# Patient Record
Sex: Male | Born: 1945 | Race: White | Hispanic: No | Marital: Married | Smoking: Never smoker
Health system: Southern US, Community
[De-identification: ages and names within clinical notes are randomized; demographics above are authoritative.]

## PROBLEM LIST (undated history)

## (undated) DIAGNOSIS — A809 Acute poliomyelitis, unspecified: Secondary | ICD-10-CM

## (undated) DIAGNOSIS — Z9989 Dependence on other enabling machines and devices: Secondary | ICD-10-CM

## (undated) DIAGNOSIS — M1990 Unspecified osteoarthritis, unspecified site: Secondary | ICD-10-CM

## (undated) DIAGNOSIS — I10 Essential (primary) hypertension: Secondary | ICD-10-CM

## (undated) DIAGNOSIS — E785 Hyperlipidemia, unspecified: Secondary | ICD-10-CM

## (undated) DIAGNOSIS — G4733 Obstructive sleep apnea (adult) (pediatric): Secondary | ICD-10-CM

## (undated) DIAGNOSIS — C649 Malignant neoplasm of unspecified kidney, except renal pelvis: Secondary | ICD-10-CM

## (undated) DIAGNOSIS — N189 Chronic kidney disease, unspecified: Secondary | ICD-10-CM

## (undated) DIAGNOSIS — C679 Malignant neoplasm of bladder, unspecified: Secondary | ICD-10-CM

## (undated) DIAGNOSIS — N529 Male erectile dysfunction, unspecified: Secondary | ICD-10-CM

## (undated) DIAGNOSIS — C61 Malignant neoplasm of prostate: Principal | ICD-10-CM

## (undated) DIAGNOSIS — K219 Gastro-esophageal reflux disease without esophagitis: Secondary | ICD-10-CM

---

## 1990-03-06 HISTORY — PX: TRANSURETHRAL RESECTION OF BLADDER TUMOR: SHX2575

## 2001-04-21 ENCOUNTER — Other Ambulatory Visit: Admission: RE | Admit: 2001-04-21 | Discharge: 2001-04-21 | Payer: Self-pay | Admitting: General Surgery

## 2001-04-25 ENCOUNTER — Other Ambulatory Visit: Admission: RE | Admit: 2001-04-25 | Discharge: 2001-04-25 | Payer: Self-pay | Admitting: General Surgery

## 2001-06-08 ENCOUNTER — Ambulatory Visit (HOSPITAL_COMMUNITY): Admission: RE | Admit: 2001-06-08 | Discharge: 2001-06-08 | Payer: Self-pay | Admitting: General Surgery

## 2001-06-08 HISTORY — PX: GANGLION CYST EXCISION: SHX1691

## 2001-10-19 ENCOUNTER — Ambulatory Visit (HOSPITAL_COMMUNITY): Admission: RE | Admit: 2001-10-19 | Discharge: 2001-10-19 | Payer: Self-pay | Admitting: General Surgery

## 2002-07-06 HISTORY — PX: PILONIDAL CYST EXCISION: SHX744

## 2003-02-02 ENCOUNTER — Encounter: Payer: Self-pay | Admitting: Internal Medicine

## 2003-02-02 ENCOUNTER — Encounter (HOSPITAL_COMMUNITY): Admission: RE | Admit: 2003-02-02 | Discharge: 2003-03-04 | Payer: Self-pay | Admitting: Internal Medicine

## 2005-06-15 ENCOUNTER — Other Ambulatory Visit: Admission: RE | Admit: 2005-06-15 | Discharge: 2005-06-15 | Payer: Self-pay | Admitting: Dermatology

## 2007-03-22 ENCOUNTER — Ambulatory Visit (HOSPITAL_COMMUNITY): Admission: RE | Admit: 2007-03-22 | Discharge: 2007-03-22 | Payer: Self-pay | Admitting: Orthopaedic Surgery

## 2010-07-17 ENCOUNTER — Ambulatory Visit (HOSPITAL_COMMUNITY)
Admission: RE | Admit: 2010-07-17 | Discharge: 2010-07-17 | Payer: Self-pay | Source: Home / Self Care | Attending: Family Medicine | Admitting: Family Medicine

## 2010-11-21 NOTE — Op Note (Signed)
[facility]  Patient:    Mason Scott, Mason Scott Visit Number: [id number] MRN: [id number]          Service Type: DSU Location: DAY Attending Physician:  Barbaraann Barthel Dictated by:   Barbaraann Barthel, M.D. Proc. Date: 06/08/01 Admit Date:  06/08/2001                             Operative Report  PREOPERATIVE DIAGNOSIS:  Ganglion cyst, right wrist.  POSTOPERATIVE DIAGNOSIS:  Ganglion cyst, right wrist.  Final pathology pending.  PROCEDURE:  Excision of ganglion cyst.  INDICATIONS:  This is a 65 year old white male who had an increasing mass on the dorsal aspect of his right wrist consistent clinically with a ganglion cyst.  We discussed excision with discussing the complications including but not limited to bleeding, infection and recurrence.  Informed consent was obtained.  GROSS OPERATIVE FINDINGS:  Those consisted of an encapsulated synovial filled ganglion cyst that was approximately 4 cm in diameter.  TECHNIQUE:  The patient was placed in the supine position and after adequate administration of Bier block anesthesia, the right arm was prepped with Betadine solution and draped in the usual manner.  Using a limb isolator, the prepping and draping was complete.  The tourniquet was inflated to 300 mmHg. A transverse incision was carried out over the palpated mass.  This was dissected down to the flexor tendons, avoiding the dorsal vein which was retracted medially and the ganglion was dissected free.  Its insertion sites on the flexor tendon were lightly cauterized with the needle-tip cautery device.  The wound was then irrigated with saline solution.  The subcutaneous layer was closed with 5-0 Polysorb and skin was approximated in horizontal mattress sutures of 5-0 nylon.  Neosporin and a sterile dressing was applied with a cock-up splint placed on the right hand.   Prior to closure, all sponge, needle and instrument counts were found to be  correct.  Estimated blood loss was minimal.  Total tourniquet time was 32 minutes.  There were no complications. Dictated by:   Barbaraann Barthel, M.D. Attending Physician:  Barbaraann Barthel DD:  06/08/01 TD:  06/08/01 Job: [id number]

## 2010-11-21 NOTE — Op Note (Signed)
[facility]  Patient:    Mason Scott, Mason Scott Visit Number: [id number] MRN: [id number]          Service Type: DSU Location: DAY Attending Physician:  Barbaraann Barthel Dictated by:   Barbaraann Barthel, M.D. Proc. 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  OPERATION:  Excision of ganglion cyst, right wrist.  SURGEON:  Barbaraann Barthel, M.D.  NOTE:  This is a 65 year old white male who has a recurrent ganglion of his right wrist.  He was admitted via the outpatient department for excisional biopsy.  TECHNIQUE:  The patient was placed in the supine position.  After adequate administration of a bier block anesthesia, the tourniquet was insufflated to 275 mmHg after evacuating the venous circulation with the Esmarch bandage.  A transverse incision was then carried out after prepping and draping in the usual manner.  The ganglion cyst was then dissected free from the extensor tendons in the mid aspect of the wrist.  There was a small venous tributary that needed to be ligated during this procedure, and the cyst was removed without problem, lightly cauterizing with the needle tip cautery device its insertion site on the tendon.  The wound was then irrigated with normal saline solution.  The subcutaneous layer was closed with 5-0 Polysorb, and the skin was approximated with 5-0 nylon in interrupted horizontal mattress sutures.  A sterile dressing with Neosporin ointment and 4 x 4s and a Kerlix bandage with a cock-up splint was placed on the right hand.  Prior to closure, all sponge, needle, and instrument counts were found to be correct.  Estimated blood loss was minimal.  The patient tolerated the procedure well and was taken to  the recovery room for discharge. Dictated by:   Barbaraann Barthel, M.D. Attending Physician:  Barbaraann Barthel DD:  10/19/01 TD:  10/20/01 Job: [id number]

## 2011-08-03 ENCOUNTER — Ambulatory Visit (HOSPITAL_COMMUNITY)
Admission: RE | Admit: 2011-08-03 | Discharge: 2011-08-03 | Disposition: A | Discharge status: Home / Self Care | Payer: BC Managed Care – PPO | Source: Ambulatory Visit | Attending: Orthopaedic Surgery | Admitting: Orthopaedic Surgery

## 2011-08-03 DIAGNOSIS — M25519 Pain in unspecified shoulder: Secondary | ICD-10-CM | POA: Insufficient documentation

## 2011-08-03 DIAGNOSIS — IMO0001 Reserved for inherently not codable concepts without codable children: Secondary | ICD-10-CM | POA: Insufficient documentation

## 2011-08-03 DIAGNOSIS — M542 Cervicalgia: Secondary | ICD-10-CM | POA: Insufficient documentation

## 2011-08-03 NOTE — Patient Instructions (Addendum)
HEP

## 2011-08-03 NOTE — Evaluation (Signed)
Physical Therapy Evaluation  Patient Details  Name: KHIREE BUKHARI MRN: [id number] Date of Birth: May 03, 1946  Today's Date: 08/03/2011 Time: 5621-3086 Time Calculation (min): 46 min  Visit#: 1  of 8   Re-eval: 09/02/11    Past Medical History: No past medical history on file. Past Surgical History: No past surgical history on file.  Subjective Symptoms/Limitations Symptoms: Mr. Wiltsey states that he has been having neck pain that is running into his right shoulder.  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  The pain has been going on for a few months.  He went to his MD who has referred him to therrapy.    Limitations: House hold activities (driving increases his soreness in his shoulder.) Pain Assessment Currently in Pain?: Yes Pain Score:   3 Pain Location: Scapula Pain Orientation: Right Pain Type: Chronic pain Pain Onset: More than a month ago Pain Frequency: Intermittent    Assessment  Cervical:   ROM and strength wfl  Shoulder:  ROM wfl;  Strength ER/Abduction 5-/5 all others wfl.    Exercise/Treatments   Seated Retraction: Strengthening;10 reps External Rotation: Strengthening;Both;10 reps;Weights External Rotation Weight (lbs): 2 Internal Rotation: Strengthening;Both;Weights Internal Rotation Weight (lbs): 2  Cervical stretch for SB  Sidelying External Rotation: Strengthening;Right;10 reps;Weights External Rotation Weight (lbs): 2 ABduction: Right;10 reps;Weights ABduction Weight (lbs): 2   Manual Therapy Manual Therapy: Massage Massage: R cervical/trap area  Physical Therapy Assessment and Plan PT Assessment and Plan Clinical Impression Statement: Pt with increased scapular pain that travel up into cervcial area who demonstrates slight decreased strength in shld ER and abduction who will benefit from skilled PT to decrease his sx of pain. Rehab Potential: Good Clinical  Impairments Affecting Rehab Potential: decreased strength, stiff cervical area. PT Frequency: Min 2X/week PT Duration: 4 weeks PT Treatment/Interventions: Therapeutic exercise;Other (comment) (modalities as needed) PT Plan: see two times a week for three to four weeks.  Pt to begin backaward UBE, horizontal abductor stretch, T-band for ER, abduction on R.  Begin wall push up and chest stretch on third treatment.    Goals Home Exercise Program Pt will Perform Home Exercise Program: Independently PT Short Term Goals Time to Complete Short Term Goals: 2 weeks PT Short Term Goal 1: Pain to be no greater than a 5 PT Long Term Goals Time to Complete Long Term Goals: 4 weeks PT Long Term Goal 1: I in advance HEP PT Long Term Goal 2: Pt to be able to drive for two hours without having increased pain. Long Term Goal 3: Pain to be no greater than a 2 and to be at a 0 for 80% of the day to improve qualitiy of life. Long Term Goal 4: Pt strength to be wnl  Problem List There is no problem list on file for this patient.   PT - End of Session Activity Tolerance: Patient tolerated treatment well General Behavior During Session: [facility] for tasks performed Cognition: [facility] for tasks performed PT Plan of Care PT Home Exercise Plan: given   Andy Allende,CINDY 08/03/2011, 3:08 PM  Physician Documentation Your signature is required to indicate approval of the treatment plan as stated above.  Please sign and either send electronically or make a copy of this report for your files and return this physician signed original.   Please mark one 1.__approve of plan  2. ___approve of plan with the following conditions.   ______________________________  _____________________ Physician Signature                                                                                                             Date

## 2011-08-05 ENCOUNTER — Ambulatory Visit (HOSPITAL_COMMUNITY): Payer: BC Managed Care – PPO | Admitting: *Deleted

## 2011-08-10 ENCOUNTER — Ambulatory Visit (HOSPITAL_COMMUNITY)
Admission: RE | Admit: 2011-08-10 | Discharge: 2011-08-10 | Disposition: A | Discharge status: Home / Self Care | Payer: BC Managed Care – PPO | Source: Ambulatory Visit | Attending: Family Medicine | Admitting: Family Medicine

## 2011-08-10 DIAGNOSIS — M542 Cervicalgia: Secondary | ICD-10-CM | POA: Insufficient documentation

## 2011-08-10 DIAGNOSIS — M25519 Pain in unspecified shoulder: Secondary | ICD-10-CM | POA: Insufficient documentation

## 2011-08-10 DIAGNOSIS — IMO0001 Reserved for inherently not codable concepts without codable children: Secondary | ICD-10-CM | POA: Insufficient documentation

## 2011-08-10 NOTE — Progress Notes (Signed)
Physical Therapy Treatment Patient Details  Name: Mason Scott MRN: [id number] Date of Birth: [birth year]  Today's Date: 08/10/2011 Time: 7829-5621 Total: 36' Visit#: 2  of 8   Re-eval: 09/02/11 Charges: Therex x 32'  Subjective: Symptoms/Limitations Symptoms: Pt states that he is feeling better than he was last week. He reports his pain has decreased. Pt also reports HEP compliance. Pain Assessment Currently in Pain?: Yes Pain Score:   2 Pain Location: Scapula Pain Orientation: Right Pain Type: Chronic pain  Precautions/Restrictions     Exercise/Treatments Seated Retraction: Strengthening;10 reps External Rotation: Strengthening;Both;10 reps;Weights External Rotation Weight (lbs): 2 Internal Rotation: Strengthening;Both;Weights Internal Rotation Weight (lbs): 2 Sidelying External Rotation: Strengthening;Right;10 reps;Weights External Rotation Weight (lbs): 2 ABduction: Right;10 reps;Weights ABduction Weight (lbs): 2 ROM / Strengthening / Isometric Strengthening UBE (Upper Arm Bike): 4'@2 .0   Stretches Internal Rotation Stretch: 2 reps External Rotation Stretch: 30 seconds;Limitations External Rotation Stretch Limitations: with towel Other Shoulder Stretches: horizontal adduct Other Shoulder Stretches: R upper trap stretch 3x30"   Physical Therapy Assessment and Plan PT Assessment and Plan Clinical Impression Statement: Pt compeltes all therex with good form and minimal need for cueing. Tightness noted in R upper trap and SCM. Began upper trap stretch to decrease tightness. PT reports decreased pain in R cervical area to 1/10. Pt rated dull scapular pain at 3/10 at end of session.  PT Plan: Continue to progress per PT POC.     Problem List There is no problem list on file for this patient.   PT - End of Session Activity Tolerance: Patient tolerated treatment well General Behavior During Session: [facility] for tasks performed Cognition: [facility] for tasks  performed  Antonieta Iba 08/10/2011, 3:45 PM

## 2011-08-12 ENCOUNTER — Ambulatory Visit (HOSPITAL_COMMUNITY): Payer: BC Managed Care – PPO | Admitting: Physical Therapy

## 2011-08-13 ENCOUNTER — Ambulatory Visit (HOSPITAL_COMMUNITY)
Admission: RE | Admit: 2011-08-13 | Discharge: 2011-08-13 | Disposition: A | Discharge status: Home / Self Care | Payer: BC Managed Care – PPO | Source: Ambulatory Visit | Attending: Orthopaedic Surgery | Admitting: Orthopaedic Surgery

## 2011-08-13 NOTE — Progress Notes (Signed)
Physical Therapy Treatment Patient Details  Name: Mason Scott MRN: [id number] Date of Birth: Jul 14, 1945  Today's Date: 08/13/2011 Time: 1350-1440 Time Calculation (min): 50 min Visit#: 3  of 8   Re-eval: 09/02/11 Charges: Therex x 33' Manual x 10'  Subjective: Symptoms/Limitations Symptoms: My pain on average is a 2/10. Pain Assessment Currently in Pain?: Yes Pain Score:   2 Pain Location: Scapula Pain Orientation: Right   Exercise/Treatments Seated Retraction: Strengthening;15 reps External Rotation: Strengthening;Both;10 reps;Weights External Rotation Weight (lbs): 3 Internal Rotation: Strengthening;Both;Weights Internal Rotation Weight (lbs): 3 Sidelying External Rotation: Strengthening;Right;Weights;15 reps External Rotation Weight (lbs): 3 ABduction: Right;Weights;15 reps ABduction Weight (lbs): 3 Standing External Rotation: 15 reps;Right;Theraband Theraband Level (Shoulder External Rotation): Level 3 (Green) ABduction: 10 reps;Left;Theraband Theraband Level (Shoulder ABduction): Level 3 (Green) Other Standing Exercises: Wall push-up x 10 ROM / Strengthening / Isometric Strengthening UBE (Upper Arm Bike): 4'@2 .0 Stretches Cross Chest Stretch: 3 reps;30 seconds;Limitations (horizontal adduction) Internal Rotation Stretch: 3 reps External Rotation Stretch: 30 seconds External Rotation Stretch Limitations: with towel Other Shoulder Stretches: Standing chest stretch 3x30" Other Shoulder Stretches: R upper trap stretch 3x30"  Manual Therapy Manual Therapy: Massage Massage: R upper trap and teres minor x 10'  Physical Therapy Assessment and Plan PT Assessment and Plan Clinical Impression Statement: Pt completes all exercises without difficulty. Began wall push-up and chest stretch to improve flexibility and strength. Pt reports decreased tightness after manual. Pt reports no change in pain at end of session. PT Plan: Continue to progress per PT POC.      Problem List There is no problem list on file for this patient.   PT - End of Session Activity Tolerance: Patient tolerated treatment well General Behavior During Session: [facility] for tasks performed Cognition: [facility] for tasks performed  Antonieta Iba 08/13/2011, 2:50 PM

## 2011-08-17 ENCOUNTER — Ambulatory Visit (HOSPITAL_COMMUNITY)
Admission: RE | Admit: 2011-08-17 | Discharge: 2011-08-17 | Disposition: A | Discharge status: Home / Self Care | Payer: BC Managed Care – PPO | Source: Ambulatory Visit | Attending: Orthopaedic Surgery | Admitting: Orthopaedic Surgery

## 2011-08-17 NOTE — Progress Notes (Signed)
Physical Therapy Treatment Patient Details  Name: TIMOHTY RENBARGER MRN: [id number] Date of Birth: 14-Jan-1946  Today's Date: 08/17/2011 Time: 1308-6578 Time Calculation (min): 42 min Visit#: 4  of 8   Re-eval: 09/02/11 Charges: Therex x 28' Manual x 12'  Subjective: Symptoms/Limitations Symptoms: Pt rates pain a 3/10 today. Pain Assessment Currently in Pain?: Yes Pain Score:   3 Pain Location: Shoulder Pain Orientation: Right   Exercise/Treatments Seated Other Seated Exercises: cervical retraction 10x5" Other Seated Exercises: w-back 10x5" Sidelying External Rotation: Strengthening;Right;Weights;15 reps External Rotation Weight (lbs): 4 ABduction: Right;Weights;15 reps ABduction Weight (lbs): 4 Standing External Rotation: 20 reps Theraband Level (Shoulder External Rotation): Level 3 (Green) ABduction: 15 reps Theraband Level (Shoulder ABduction): Level 3 (Green) Other Standing Exercises: Wall push-up x 15 Stretches Cross Chest Stretch: 3 reps;30 seconds;Limitations Cross Chest Stretch Limitations: horizontal abd Other Shoulder Stretches: R upper trap stretch 3x30"    Manual Therapy Manual Therapy: Massage Massage: R upper trap and teres minor x 12'  Physical Therapy Assessment and Plan PT Assessment and Plan Clinical Impression Statement: Pt displays good form with all exercises. Began seated cervical retraction to improve posture. Pt had moderate difficulty with this exercises secondary to weakness. Pt reports no change in pain at end of session but decreased tightness. PT Plan: Continue to progress per PT POC.     Problem List There is no problem list on file for this patient.   PT - End of Session Activity Tolerance: Patient tolerated treatment well General Behavior During Session: [facility] for tasks performed Cognition: [facility] for tasks performed  Antonieta Iba 08/17/2011, 4:54 PM

## 2011-08-19 ENCOUNTER — Ambulatory Visit (HOSPITAL_COMMUNITY): Payer: BC Managed Care – PPO | Admitting: *Deleted

## 2011-08-20 ENCOUNTER — Ambulatory Visit (HOSPITAL_COMMUNITY)
Admission: RE | Admit: 2011-08-20 | Discharge: 2011-08-20 | Disposition: A | Discharge status: Home / Self Care | Payer: BC Managed Care – PPO | Source: Ambulatory Visit | Attending: Orthopaedic Surgery | Admitting: Orthopaedic Surgery

## 2011-08-20 NOTE — Progress Notes (Signed)
Physical Therapy Treatment Patient Details  Name: Mason Scott MRN: [id number] Date of Birth: 15-Aug-1945  Today's Date: 08/20/2011 Time: 0102-7253 Time Calculation (min): 45 min Visit#: 5  of 8   Re-eval: 09/02/11 Diagnosis: R shld impingement/cervical OA Next MD Visit: 09/02/2011 Charges:  Therex 30', massage 12'  Subjective: Symptoms/Limitations Symptoms: Pt. states his pain remains 3/10 as last visit. Pain Assessment Currently in Pain?: Yes Pain Score:   3 Pain Location: Shoulder Pain Orientation: Right   Exercise/Treatments Prone  Retraction: 10 reps Extension: 10 reps Horizontal ABduction 1: 10 reps Other Prone Exercises: chin tuck head lift 10 reps Sidelying External Rotation: 15 reps External Rotation Weight (lbs): 4 ABduction: 15 reps ABduction Weight (lbs): 4 Standing External Rotation: 20 reps Theraband Level (Shoulder External Rotation): Level 4 (Blue) ABduction: 20 reps Theraband Level (Shoulder ABduction): Level 4 (Blue) Other Standing Exercises: Wall push-up x 15 ROM / Strengthening / Isometric Strengthening UBE (Upper Arm Bike): 4'@2 .0     Manual Therapy Manual Therapy: Massage Massage: R upper trap/scap and rhomboids X 12'  Physical Therapy Assessment and Plan PT Assessment and Plan Clinical Impression Statement: Progressed to prone exercises today without difficulty, no spasms/tightness palpated in R cervical region, however multiple spasms in scapular region resolved with massage.  Pt. reported significant reduction in pain following treatment. PT Plan: Continue to progress strength and decrease pain.     PT - End of Session Activity Tolerance: Patient tolerated treatment well General Behavior During Session: [facility] for tasks performed Cognition: [facility] for tasks performed  [name] B. Bascom Levels, PTA 08/20/2011, 2:34 PM

## 2011-08-24 ENCOUNTER — Ambulatory Visit (HOSPITAL_COMMUNITY)
Admission: RE | Admit: 2011-08-24 | Discharge: 2011-08-24 | Disposition: A | Discharge status: Home / Self Care | Payer: BC Managed Care – PPO | Source: Ambulatory Visit | Attending: Orthopaedic Surgery | Admitting: Orthopaedic Surgery

## 2011-08-24 NOTE — Progress Notes (Signed)
Physical Therapy Treatment Patient Details  Name: Mason Scott MRN: [id number] Date of Birth: [birth year]  Today's Date: 08/24/2011 Time: 2440-1027 Time Calculation (min): 45 min Visit#: 6  of 8   Re-eval: 09/02/11 Charges: Therex x 22' Manual x 8' Ultrasound x 8'  Subjective: Symptoms/Limitations Symptoms: Pt states that his pain is about a 2/10 in his R shoulder. Pain Assessment Currently in Pain?: Yes Pain Score:   3 Pain Location: Shoulder Pain Orientation: Right   Exercise/Treatments Prone  Retraction: 10 reps;Weights Retraction Weight (lbs): 2 Extension: 10 reps;Weights Extension Weight (lbs): 2 Horizontal ABduction 1: 10 reps;Weights Horizontal ABduction 1 Weight (lbs): 2 Other Prone Exercises: chin tuck head lift x 15 Sidelying External Rotation: 20 reps External Rotation Weight (lbs): 4 ABduction: 20 reps ABduction Weight (lbs): 4 Standing External Rotation: 20 reps Theraband Level (Shoulder External Rotation): Level 4 (Blue) ABduction: 20 reps Theraband Level (Shoulder ABduction): Level 4 (Blue) Other Standing Exercises: Wall push-up x 20 ROM / Strengthening / Isometric Strengthening UBE (Upper Arm Bike): 4'@2 .0  Modalities Modalities: Ultrasound Manual Therapy Manual Therapy: Massage Massage: R upper trap/scap X 8' Ultrasound Ultrasound Location: R upper trap Ultrasound Parameters: 1.4 w/cm2 continuous x 8'  Physical Therapy Assessment and Plan PT Assessment and Plan Clinical Impression Statement: Pt completes exercises without difficulty and minimal need for cueing. Began ultrasound this session to decrease pain and tightness. Pt reports that his shoulder and scapular region better at end of session but the mm felt a little tight from the exercises. PT Plan: Continue to progress per PT POC. Assess effects of ultrasound next session.     Problem List There is no problem list on file for this patient.   PT - End of Session Activity  Tolerance: Patient tolerated treatment well General Behavior During Session: [facility] for tasks performed Cognition: [facility] for tasks performed  Antonieta Iba 08/24/2011, 3:37 PM

## 2011-08-26 ENCOUNTER — Ambulatory Visit (HOSPITAL_COMMUNITY): Payer: BC Managed Care – PPO | Admitting: Physical Therapy

## 2011-08-27 ENCOUNTER — Ambulatory Visit (HOSPITAL_COMMUNITY): Payer: BC Managed Care – PPO | Admitting: *Deleted

## 2012-05-23 DIAGNOSIS — C61 Malignant neoplasm of prostate: Secondary | ICD-10-CM

## 2012-05-23 HISTORY — DX: Malignant neoplasm of prostate: C61

## 2012-05-23 HISTORY — PX: PROSTATE BIOPSY: SHX241

## 2012-07-21 ENCOUNTER — Ambulatory Visit
Admission: RE | Admit: 2012-07-21 | Discharge: 2012-07-21 | Disposition: A | Discharge status: Home / Self Care | Payer: MEDICARE | Source: Ambulatory Visit | Attending: Radiation Oncology | Admitting: Radiation Oncology

## 2012-07-21 ENCOUNTER — Encounter: Payer: Self-pay | Admitting: Radiation Oncology

## 2012-07-21 VITALS — BP 146/76 | HR 61 | Temp 98.2°F | Ht 70.0 in | Wt 216.3 lb

## 2012-07-21 DIAGNOSIS — C61 Malignant neoplasm of prostate: Secondary | ICD-10-CM | POA: Insufficient documentation

## 2012-07-21 DIAGNOSIS — N529 Male erectile dysfunction, unspecified: Secondary | ICD-10-CM | POA: Insufficient documentation

## 2012-07-21 DIAGNOSIS — Z7982 Long term (current) use of aspirin: Secondary | ICD-10-CM | POA: Insufficient documentation

## 2012-07-21 DIAGNOSIS — G4733 Obstructive sleep apnea (adult) (pediatric): Secondary | ICD-10-CM | POA: Insufficient documentation

## 2012-07-21 DIAGNOSIS — E785 Hyperlipidemia, unspecified: Secondary | ICD-10-CM | POA: Insufficient documentation

## 2012-07-21 DIAGNOSIS — Z8551 Personal history of malignant neoplasm of bladder: Secondary | ICD-10-CM | POA: Insufficient documentation

## 2012-07-21 DIAGNOSIS — I10 Essential (primary) hypertension: Secondary | ICD-10-CM | POA: Insufficient documentation

## 2012-07-21 DIAGNOSIS — Z79899 Other long term (current) drug therapy: Secondary | ICD-10-CM | POA: Insufficient documentation

## 2012-07-21 HISTORY — DX: Essential (primary) hypertension: I10

## 2012-07-21 HISTORY — DX: Unspecified osteoarthritis, unspecified site: M19.90

## 2012-07-21 HISTORY — DX: Hyperlipidemia, unspecified: E78.5

## 2012-07-21 HISTORY — DX: Male erectile dysfunction, unspecified: N52.9

## 2012-07-21 HISTORY — DX: Malignant neoplasm of prostate: C61

## 2012-07-21 NOTE — Progress Notes (Signed)
[facility] Radiation Oncology NEW PATIENT EVALUATION  Name: Mason Scott MRN: [id number]  Date:   07/21/2012           DOB: 08-04-1945  Status: outpatient   CC: Colette Ribas, MD  Crecencio Mc, MD    REFERRING PHYSICIAN: Crecencio Mc, MD   DIAGNOSIS: Stage TI C. favorable risk adenocarcinoma prostate   HISTORY OF PRESENT ILLNESS:  Mason Scott is a 67 y.o. male who is seen today for the courtesy of Dr. Crecencio Mc for discussion of possible seed implantation in the management of his stage TI C. favorable risk adenocarcinoma prostate. He was seen by Dr. Laverle Patter for a second opinion after being diagnosed by Dr. Andi Hence M.D. and Diamantina Providence after his then have a PSA of 6.14 on 04/04/2012. 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 Prostate volume was reported be 29.8 cc. No GU or GI difficulties. His I PSS score is 2. 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 No recurrences since then with normal cystoscopy in September 2013. He does have mild erectile dysfunction which does not always respond to Cialis or Viagra. He was referred to Dr. Laverle Patter for discussion of robotic surgery. He seen today for discussion of possible radiation therapy/seed implantation.  PREVIOUS RADIATION THERAPY: No   PAST MEDICAL HISTORY:  has a past medical history of Obstructive sleep apnea, adult; Arthritis; Hyperlipidemia; Hypertension; Prostate cancer (05/23/12); History of bladder cancer (03/1990); and Erectile dysfunction.     PAST SURGICAL HISTORY:  Past Surgical History    Procedure Date  . Prostate biopsy 05/23/12  . Transurethral resection of bladder tumor 03/1990  . Cyst excision     lower spine years ago     FAMILY HISTORY: family history includes Congenital heart disease in his mother and Stroke in his father. His father died at 64 following a stroke. His mother died following a motor vehicle accident at 10. No family history of prostate cancer.   SOCIAL HISTORY:  reports that he has never smoked. He has never used smokeless tobacco. He reports that he does not drink alcohol or use illicit drugs. Married, 2 adopted children. His daughter is getting married in September in [place]. Retired from [employer].   ALLERGIES: Review of patient's allergies indicates no known allergies.   MEDICATIONS:  Current Outpatient Prescriptions  Medication Sig Dispense Refill  . amLODipine (NORVASC) 10 MG tablet Take 10 mg by mouth daily.      Marland Kitchen aspirin 81 MG tablet Take 81 mg by mouth daily.      . fish oil-omega-3 fatty acids 1000 MG capsule Take 1 g by mouth daily.      . saw palmetto 500 MG capsule Take 500 mg by mouth daily. 1000 mg      . valsartan-hydrochlorothiazide (DIOVAN-HCT) 160-25 MG per tablet Take 1 tablet by mouth daily. Takes a half pill qam.         REVIEW OF SYSTEMS:  Pertinent items are noted in HPI.    PHYSICAL EXAM:  height is 5\' 10"  (  1.778 m) and weight is 216 lb 4.8 oz (98.113 kg). His temperature is 98.2 F (36.8 C). His blood pressure is 146/76 and his pulse is 61.   Alert and oriented 67 year old white male appearing his stated age. Head neck examination: Grossly unremarkable. Nodes: Without palpable cervical or supraclavicular lymphadenopathy. Chest: Lungs clear. Heart: Regular in rhythm. Back: Without spinal or CVA tenderness. Abdomen: Without masses organomegaly. Genitalia: Normal to inspection. Rectal: The prostate gland is normal in size and is without focal induration or nodularity. Extremities: Without edema.   LABORATORY  DATA:  No results found for this basename: WBC, HGB, HCT, MCV, PLT   No results found for this basename: NA, K, CL, CO2   No results found for this basename: ALT, AST, GGT, ALKPHOS, BILITOT   PSA 6.14 from 04/04/2012   IMPRESSION: Stage TI C. favorable risk adenocarcinoma prostate. I explained to the patient and his wife that his prognosis is related to his stage, Gleason score, and PSA level. All are favorable. His treatment options include surgery versus close surveillance/observation versus radiation therapy. Considering his relatively young age and excellent performance status, I would certainly offer him curative treatment. Radiation therapy options include seed implantation alone or 8 weeks of external beam/IMRT. After lengthy discussion he would be most interested in seed implantation as a radiation therapy option. We discussed the potential acute and late toxicities and also radiation therapy safety issues with seed implantation. He home and think things over and call Dr. Laverle Patter he wants to proceed with surgery or call me if he wants to schedule seed implantation. He comes make a decision by early next week.   PLAN: As discussed above. He knows we will need to do a CT arch study if he wants to proceed with seed implantation.   I spent 60 minutes minutes face to face with the patient and more than 50% of that time was spent in counseling and/or coordination of care.

## 2012-07-21 NOTE — Progress Notes (Signed)
Please see the Nurse Progress Note in the MD Initial Consult Encounter for this patient. 

## 2012-07-21 NOTE — Addendum Note (Signed)
Encounter addended by: Tessa Lerner, RN on: 07/21/2012  3:54 PM<BR>     Documentation filed: Charges VN

## 2012-07-21 NOTE — Addendum Note (Signed)
Encounter addended by: Tessa Lerner, RN on: 07/21/2012  4:10 PM<BR>     Documentation filed: Charges VN

## 2012-07-21 NOTE — Progress Notes (Signed)
Patient for radiation consultation of prostate cancer. PSA  6.14 Gleason score 3+3=6 Prostate volume 29.8 cc's No family history  of prostate cancer

## 2012-07-25 ENCOUNTER — Telehealth: Payer: Self-pay | Admitting: *Deleted

## 2012-07-25 ENCOUNTER — Encounter: Payer: Self-pay | Admitting: Radiation Oncology

## 2012-07-25 NOTE — Progress Notes (Signed)
Chart note: Mason Scott called this morning to tell me that he wants to proceed with a seed implant with Dr. Vevelyn Royals assistance. Will go ahead and get him scheduled for a CT arch study in the near future.

## 2012-07-25 NOTE — Telephone Encounter (Signed)
CALLED PATIENT TO INFORM OF PRE-SEED APPT. FOR 08-15-12 AT 9:00 AM , SPOKE WITH PATIENT AND HE IS AWARE OF THIS APPT.

## 2012-08-08 ENCOUNTER — Other Ambulatory Visit: Payer: Self-pay | Admitting: Radiation Oncology

## 2012-08-15 ENCOUNTER — Ambulatory Visit
Admission: RE | Admit: 2012-08-15 | Discharge: 2012-08-15 | Disposition: A | Discharge status: Home / Self Care | Payer: MEDICARE | Source: Ambulatory Visit | Attending: Radiation Oncology | Admitting: Radiation Oncology

## 2012-08-15 ENCOUNTER — Telehealth: Payer: Self-pay | Admitting: *Deleted

## 2012-08-15 ENCOUNTER — Other Ambulatory Visit: Payer: Self-pay | Admitting: Urology

## 2012-08-15 DIAGNOSIS — C61 Malignant neoplasm of prostate: Secondary | ICD-10-CM | POA: Insufficient documentation

## 2012-08-15 NOTE — Progress Notes (Signed)
   Complex CT simulation/treatment planning note: The patient was taken to the CT simulator. He was placed supine. His pelvis was scanned. I contoured the prostate and projected the prostate over the bony pubic arch. The arch is open. Prostate volume is 27.64 cc with a prosthetic length of 3.3 cm. I prescribing 14,500 cGy utilizing I-125 seeds. He is to be implanted with [employer] system.

## 2012-08-15 NOTE — Telephone Encounter (Signed)
CALLED PATIENT TO INFORM OF IMPLANT DATE, LVM FOR A RETURN CALL 

## 2012-08-16 ENCOUNTER — Ambulatory Visit (HOSPITAL_BASED_OUTPATIENT_CLINIC_OR_DEPARTMENT_OTHER)
Admission: RE | Admit: 2012-08-16 | Discharge: 2012-08-16 | Disposition: A | Discharge status: Home / Self Care | Payer: MEDICARE | Source: Ambulatory Visit | Attending: Urology | Admitting: Urology

## 2012-08-16 ENCOUNTER — Other Ambulatory Visit: Payer: Self-pay | Admitting: Urology

## 2012-08-16 ENCOUNTER — Encounter (HOSPITAL_BASED_OUTPATIENT_CLINIC_OR_DEPARTMENT_OTHER)
Admission: RE | Admit: 2012-08-16 | Discharge: 2012-08-16 | Disposition: A | Discharge status: Home / Self Care | Payer: MEDICARE | Source: Ambulatory Visit | Attending: Urology | Admitting: Urology

## 2012-08-16 DIAGNOSIS — C61 Malignant neoplasm of prostate: Secondary | ICD-10-CM | POA: Insufficient documentation

## 2012-08-16 DIAGNOSIS — Z01810 Encounter for preprocedural cardiovascular examination: Secondary | ICD-10-CM | POA: Insufficient documentation

## 2012-08-16 DIAGNOSIS — Z01812 Encounter for preprocedural laboratory examination: Secondary | ICD-10-CM | POA: Insufficient documentation

## 2012-09-20 ENCOUNTER — Encounter (HOSPITAL_BASED_OUTPATIENT_CLINIC_OR_DEPARTMENT_OTHER): Payer: Self-pay | Admitting: *Deleted

## 2012-09-21 ENCOUNTER — Telehealth: Payer: Self-pay | Admitting: *Deleted

## 2012-09-21 NOTE — Telephone Encounter (Signed)
Called patient to remind of appt. For 09-22-12, spoke with patient and she is aware of this appt.

## 2012-09-22 ENCOUNTER — Encounter (HOSPITAL_BASED_OUTPATIENT_CLINIC_OR_DEPARTMENT_OTHER): Payer: Self-pay | Admitting: *Deleted

## 2012-09-22 LAB — COMPREHENSIVE METABOLIC PANEL
ALT: 16 U/L (ref 0–53)
AST: 16 U/L (ref 0–37)
Calcium: 9.1 mg/dL (ref 8.4–10.5)
Creatinine, Ser: 1.05 mg/dL (ref 0.50–1.35)
GFR calc Af Amer: 84 mL/min — ABNORMAL LOW (ref >90)
Sodium: 136 mEq/L (ref 135–145)
Total Protein: 6.7 g/dL (ref 6.0–8.3)

## 2012-09-22 LAB — CBC
MCH: 31.3 pg (ref 26.0–34.0)
MCHC: 34.8 g/dL (ref 30.0–36.0)
Platelets: 212 10*3/uL (ref 150–400)

## 2012-09-22 LAB — PROTIME-INR: INR: 1.06 (ref 0.00–1.49)

## 2012-09-22 LAB — APTT: aPTT: 33 seconds (ref 24–37)

## 2012-09-22 NOTE — Progress Notes (Signed)
NPO AFTER MN WITH EXCEPTION CLEAR LIQUIDS UNTIL 0830 (NO CREAM/ MILK PRODUCTS). ARRIVES AT 1315.  CURRENT LAB WORK , EKG , AND CXR IN EPIC AND CHART. WILL DO FLEET ENEMA AM OF SURG.

## 2012-09-28 ENCOUNTER — Telehealth: Payer: Self-pay | Admitting: *Deleted

## 2012-09-28 NOTE — H&P (Signed)
Chief Complaint  Prostate Cancer   History of Present Illness     Mason Scott is a 67 year old gentleman seen for a 2nd opinion regarding his options for treatment/management of his prostate cancer. 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  He has had no recurrences since then with his most recent cystoscopy in September 2013 negative for recurrence.   His PSA was found to be increased in September 2013 to 6.14 and his exam reportedly demonstrated left sided induration of the prostate which prompted a prostate biopsy on 05/31/12 and confirmed Gleason 3+3=6 adenocarcinoma of the prostate in 5 out of 12 biopsy cores. He is overall healthy with hypertension but no other comorbid conditions. He has no family history of prostate cancer.  TNM stage: cT2a Nx Mx PSA: 6.14 Gleason score: 3+3=6 Biopsy (05/31/12 - [facility], read by Dr. Marva Panda, Acc # [phone]): 5/12 cores positive   Left: L apex (25% of tissue, 2/2 cores, 3+3=6), L mid (40% of tissue, 2/2 cores, 3+3=6), L base (20% of tissue, 1/2 cores, 3+3=6)   Right: HGPIN in apex, mid, and base Prostate volume: 29.8 cc  Urinary function: He has minimal voiding symptoms. IPSS is 1. Erectile function: He does have mild erectile dysfunction will occasionally utilize Cialis or Viagra although more commonly will not use any medication. He does have some difficulty maintaining an erection. SHIM score is 21.   Past Medical History Problems  1. History of  Adult Sleep Apnea 780.57 2. History of  Arthritis V13.4 3. History of  Hyperlipidemia 272.4 4. History of  Hypertension 401.9   He does use a CPAP machine nightly.   Surgical History Problems  1. History of  Cystoscopy Bladder Tumor 596.9  Current Meds 1. AmLODIPine Besylate 10 MG Oral Tablet; Therapy: (Recorded:03Jan2014) to 2. Aspir-81 81 MG Oral Tablet  Delayed Release; Therapy: (Recorded:03Jan2014) to 3. CPAP; Therapy: (Recorded:03Jan2014) to 4. Diovan HCT 160-25 MG Oral Tablet; Therapy: (Recorded:03Jan2014) to 5. Fish Oil Concentrate 1000 MG Oral Capsule; Therapy: (Recorded:03Jan2014) to 6. Flomax 0.4 MG Oral Capsule; Therapy: (Recorded:03Jan2014) to 7. Saw Palmetto 1000 MG Oral Capsule; Therapy: (Recorded:03Jan2014) to  Allergies Medication  1. No Known Drug Allergies  Family History Problems  1. Maternal history of  Breast Cancer V16.3 2. Paternal history of  Stroke Syndrome V17.1 Denied  3. Family history of  Prostate Cancer  Social History Problems    Marital History - Currently Married   Never A Smoker Denied    History of  Alcohol Use  Review of Systems Constitutional, skin, eye, otolaryngeal, hematologic/lymphatic, cardiovascular, pulmonary, endocrine, musculoskeletal, gastrointestinal, neurological and psychiatric system(s) were reviewed and pertinent findings if present are noted.  Musculoskeletal: joint pain.    Vitals  BMI Calculated: 30.07 BSA Calculated: 2.13 Height: 5 ft 10 in Weight: 210 lb    Physical Exam Constitutional: Well nourished and well developed . No acute distress.  ENT:. The ears and nose are normal in appearance.  Neck: The appearance of the neck is normal and no neck mass is present.  Pulmonary: No respiratory distress, normal respiratory rhythm and effort and clear bilateral breath sounds.  Cardiovascular: Heart rate and rhythm are normal . No peripheral edema.  Abdomen: The abdomen is soft and nontender. No masses are palpated. No CVA tenderness. No hernias are palpable. No hepatosplenomegaly noted.   Lymphatics: The femoral and inguinal nodes are not enlarged or tender.  Skin: Normal skin turgor, no visible rash and no visible skin lesions.  Neuro/Psych:. Mood and affect are appropriate.    Assessment Assessed  1. Prostate Cancer 185     Discussion/Summary  1. Prostate  cancer: He has elected to be treated with brachytherapy and will undergo a radiation I-125 seed implantation today and cystoscopy.

## 2012-09-28 NOTE — Telephone Encounter (Signed)
CALLED PATIENT TO REMIND OF PROCEDURE FOR 09-29-12, SPOKE WITH PATIENT AND HE IS AWARE OF THIS PROCEDURE

## 2012-09-29 ENCOUNTER — Ambulatory Visit (HOSPITAL_BASED_OUTPATIENT_CLINIC_OR_DEPARTMENT_OTHER): Payer: MEDICARE | Admitting: Anesthesiology

## 2012-09-29 ENCOUNTER — Encounter (HOSPITAL_BASED_OUTPATIENT_CLINIC_OR_DEPARTMENT_OTHER): Payer: Self-pay | Admitting: Anesthesiology

## 2012-09-29 ENCOUNTER — Ambulatory Visit (HOSPITAL_COMMUNITY): Payer: MEDICARE

## 2012-09-29 ENCOUNTER — Ambulatory Visit (HOSPITAL_BASED_OUTPATIENT_CLINIC_OR_DEPARTMENT_OTHER)
Admission: RE | Admit: 2012-09-29 | Discharge: 2012-09-29 | Disposition: A | Discharge status: Home / Self Care | Payer: MEDICARE | Source: Ambulatory Visit | Attending: Urology | Admitting: Urology

## 2012-09-29 ENCOUNTER — Encounter: Payer: Self-pay | Admitting: Radiation Oncology

## 2012-09-29 ENCOUNTER — Encounter (HOSPITAL_BASED_OUTPATIENT_CLINIC_OR_DEPARTMENT_OTHER)
Admission: RE | Disposition: A | Discharge status: Home / Self Care | Payer: Self-pay | Source: Ambulatory Visit | Attending: Urology

## 2012-09-29 DIAGNOSIS — C61 Malignant neoplasm of prostate: Secondary | ICD-10-CM | POA: Insufficient documentation

## 2012-09-29 DIAGNOSIS — G4730 Sleep apnea, unspecified: Secondary | ICD-10-CM | POA: Insufficient documentation

## 2012-09-29 DIAGNOSIS — Z8551 Personal history of malignant neoplasm of bladder: Secondary | ICD-10-CM | POA: Insufficient documentation

## 2012-09-29 DIAGNOSIS — E785 Hyperlipidemia, unspecified: Secondary | ICD-10-CM | POA: Insufficient documentation

## 2012-09-29 DIAGNOSIS — I10 Essential (primary) hypertension: Secondary | ICD-10-CM | POA: Insufficient documentation

## 2012-09-29 HISTORY — PX: CYSTOSCOPY: SHX5120

## 2012-09-29 HISTORY — PX: RADIOACTIVE SEED IMPLANT: SHX5150

## 2012-09-29 HISTORY — DX: Obstructive sleep apnea (adult) (pediatric): G47.33

## 2012-09-29 HISTORY — DX: Dependence on other enabling machines and devices: Z99.89

## 2012-09-29 SURGERY — INSERTION, RADIATION SOURCE, PROSTATE
Anesthesia: General | Site: Prostate | Wound class: Clean Contaminated

## 2012-09-29 MED ORDER — FLEET ENEMA 7-19 GM/118ML RE ENEM
1.0000 | ENEMA | Freq: Once | RECTAL | Status: DC
  Filled 2012-09-29: qty 1

## 2012-09-29 MED ORDER — ONDANSETRON HCL 4 MG/2ML IJ SOLN
INTRAMUSCULAR | Status: DC | PRN
  Administered 2012-09-29: 4 mg via INTRAVENOUS

## 2012-09-29 MED ORDER — MIDAZOLAM HCL 5 MG/5ML IJ SOLN
INTRAMUSCULAR | Status: DC | PRN
  Administered 2012-09-29: 2 mg via INTRAVENOUS

## 2012-09-29 MED ORDER — CIPROFLOXACIN IN D5W 400 MG/200ML IV SOLN
400.0000 mg | INTRAVENOUS | Status: AC
  Administered 2012-09-29: 400 mg via INTRAVENOUS
  Filled 2012-09-29: qty 200

## 2012-09-29 MED ORDER — TAMSULOSIN HCL 0.4 MG PO CAPS: 0.4000 mg | ORAL_CAPSULE | Freq: Every day | ORAL | Status: DC

## 2012-09-29 MED ORDER — DOCUSATE SODIUM 100 MG PO CAPS: 100.0000 mg | ORAL_CAPSULE | Freq: Two times a day (BID) | ORAL | Status: DC

## 2012-09-29 MED ORDER — HYDROCODONE-ACETAMINOPHEN 5-325 MG PO TABS: 1.0000 | ORAL_TABLET | Freq: Four times a day (QID) | ORAL | Status: DC | PRN

## 2012-09-29 MED ORDER — FENTANYL CITRATE 0.05 MG/ML IJ SOLN
INTRAMUSCULAR | Status: DC | PRN
  Administered 2012-09-29 (×2): 25 ug via INTRAVENOUS
  Administered 2012-09-29: 50 ug via INTRAVENOUS
  Administered 2012-09-29: 25 ug via INTRAVENOUS
  Administered 2012-09-29 (×2): 50 ug via INTRAVENOUS
  Administered 2012-09-29 (×3): 25 ug via INTRAVENOUS

## 2012-09-29 MED ORDER — CIPROFLOXACIN HCL 500 MG PO TABS: 500.0000 mg | ORAL_TABLET | Freq: Two times a day (BID) | ORAL | Status: DC

## 2012-09-29 MED ORDER — HYDROCODONE-ACETAMINOPHEN 5-325 MG PO TABS
1.0000 | ORAL_TABLET | Freq: Four times a day (QID) | ORAL | Status: DC | PRN
  Administered 2012-09-29: 1 via ORAL
  Filled 2012-09-29: qty 2

## 2012-09-29 MED ORDER — LACTATED RINGERS IV SOLN
INTRAVENOUS | Status: DC | PRN
  Administered 2012-09-29 (×3): via INTRAVENOUS

## 2012-09-29 MED ORDER — STERILE WATER FOR IRRIGATION IR SOLN
Status: DC | PRN
  Administered 2012-09-29: 3000 mL

## 2012-09-29 MED ORDER — BELLADONNA ALKALOIDS-OPIUM 16.2-60 MG RE SUPP
RECTAL | Status: DC | PRN
  Administered 2012-09-29: 1 via RECTAL

## 2012-09-29 MED ORDER — IOHEXOL 350 MG/ML SOLN
INTRAVENOUS | Status: DC | PRN
  Administered 2012-09-29: 7 mL

## 2012-09-29 MED ORDER — PROPOFOL 10 MG/ML IV BOLUS
INTRAVENOUS | Status: DC | PRN
  Administered 2012-09-29: 300 mg via INTRAVENOUS

## 2012-09-29 MED ORDER — FENTANYL CITRATE 0.05 MG/ML IJ SOLN
25.0000 ug | INTRAMUSCULAR | Status: DC | PRN
  Filled 2012-09-29: qty 1

## 2012-09-29 MED ORDER — LIDOCAINE HCL (CARDIAC) 20 MG/ML IV SOLN
INTRAVENOUS | Status: DC | PRN
  Administered 2012-09-29: 100 mg via INTRAVENOUS

## 2012-09-29 MED ORDER — PROMETHAZINE HCL 25 MG/ML IJ SOLN
6.2500 mg | INTRAMUSCULAR | Status: DC | PRN
  Filled 2012-09-29: qty 1

## 2012-09-29 MED ORDER — LACTATED RINGERS IV SOLN
INTRAVENOUS | Status: DC
  Filled 2012-09-29: qty 1000

## 2012-09-29 MED ORDER — MEPERIDINE HCL 25 MG/ML IJ SOLN
6.2500 mg | INTRAMUSCULAR | Status: DC | PRN
  Filled 2012-09-29: qty 1

## 2012-09-29 MED ORDER — KETOROLAC TROMETHAMINE 30 MG/ML IJ SOLN
INTRAMUSCULAR | Status: DC | PRN
  Administered 2012-09-29: 30 mg via INTRAVENOUS

## 2012-09-29 MED ORDER — LACTATED RINGERS IV SOLN
INTRAVENOUS | Status: DC
  Administered 2012-09-29: 100 mL/h via INTRAVENOUS
  Filled 2012-09-29: qty 1000

## 2012-09-29 MED ORDER — GLYCOPYRROLATE 0.2 MG/ML IJ SOLN
INTRAMUSCULAR | Status: DC | PRN
  Administered 2012-09-29: 0.2 mg via INTRAVENOUS

## 2012-09-29 MED ORDER — DEXAMETHASONE SODIUM PHOSPHATE 4 MG/ML IJ SOLN
INTRAMUSCULAR | Status: DC | PRN
  Administered 2012-09-29: 10 mg via INTRAVENOUS

## 2012-09-29 SURGICAL SUPPLY — 24 items
BAG URINE DRAINAGE (UROLOGICAL SUPPLIES) ×3 IMPLANT
BLADE SURG ROTATE 9660 (MISCELLANEOUS) ×3 IMPLANT
CATH FOLEY 2WAY SLVR  5CC 16FR (CATHETERS) ×2
CATH FOLEY 2WAY SLVR 5CC 16FR (CATHETERS) ×4 IMPLANT
CATH ROBINSON RED A/P 20FR (CATHETERS) ×3 IMPLANT
CLOTH BEACON ORANGE TIMEOUT ST (SAFETY) ×3 IMPLANT
COVER MAYO STAND STRL (DRAPES) ×3 IMPLANT
COVER TABLE BACK 60X90 (DRAPES) ×3 IMPLANT
DRSG TEGADERM 4X4.75 (GAUZE/BANDAGES/DRESSINGS) ×3 IMPLANT
DRSG TEGADERM 8X12 (GAUZE/BANDAGES/DRESSINGS) ×3 IMPLANT
GLOVE BIO SURGEON STRL SZ7 (GLOVE) ×1 IMPLANT
GLOVE BIO SURGEON STRL SZ7.5 (GLOVE) ×10 IMPLANT
GLOVE BIOGEL PI IND STRL 7.0 (GLOVE) IMPLANT
GLOVE BIOGEL PI INDICATOR 7.0 (GLOVE) ×2
GLOVE ECLIPSE 8.0 STRL XLNG CF (GLOVE) IMPLANT
GOWN PREVENTION PLUS LG XLONG (DISPOSABLE) ×3 IMPLANT
GOWN STRL REIN XL XLG (GOWN DISPOSABLE) ×3 IMPLANT
HOLDER FOLEY CATH W/STRAP (MISCELLANEOUS) ×3 IMPLANT
PACK CYSTOSCOPY (CUSTOM PROCEDURE TRAY) ×3 IMPLANT
SPONGE GAUZE 4X4 12PLY (GAUZE/BANDAGES/DRESSINGS) ×1 IMPLANT
SYRINGE 10CC LL (SYRINGE) ×3 IMPLANT
UNDERPAD 30X30 INCONTINENT (UNDERPADS AND DIAPERS) ×6 IMPLANT
WATER STERILE IRR 500ML POUR (IV SOLUTION) ×3 IMPLANT
nucletron, selectseed ×1 IMPLANT

## 2012-09-29 NOTE — Progress Notes (Signed)
[facility] Radiation Oncology Brachytherapy Operative Procedure Note  Name: ABDULMALIK DARCO MRN: [id number]  Date:   08/15/2012           DOB: 1946-02-06  Status:outpatient    NW:GNFAOZH, [name], MD  Dr. Heloise Purpura DIAGNOSIS: A 67 year old gentlemen with stage T1 C. adenocarcinoma of the prostate with a Gleason of 6 and a PSA of 6.14.  PROCEDURE: Insertion of radioactive I-125 seeds into the prostate gland.  RADIATION DOSE: 145 Gy, definitive therapy.  TECHNIQUE: Mason Scott was brought to the operating room with Dr. Laverle Patter. He was placed in the dorsolithotomy position. He was catheterized and a rectal tube was inserted. The perineum was shaved, prepped and draped. The ultrasound probe was then introduced into the rectum to see the prostate gland.  TREATMENT DEVICE: A needle grid was attached to the ultrasound probe stand and anchor needles were placed.  COMPLEX ISODOSE CALCULATION: The prostate was imaged in 3D using a sagittal sweep of the prostate probe. These images were transferred to the planning computer. There, the prostate, urethra and rectum were defined on each axial reconstructed image. Then, the software created an optimized plan and a few seed positions were adjusted. Then the accepted plan was uploaded to the seed Selectron afterloading unit.  SPECIAL TREATMENT PROCEDURE/SUPERVISION AND HANDLING: The Nucletron FIRST system was used to place the needles under sagittal guidance. A total of 26 needles were used to deposit 65 seeds in the prostate gland. The individual seed activity was 0.346 mCi for a total implant activity of 25.48 mCi.  COMPLEX SIMULATION: At the end of the procedure, an anterior radiograph of the pelvis was obtained to document seed positioning and count. Cystoscopy was performed to check the urethra and bladder.  MICRODOSIMETRY: At the end of the procedure, the patient was emitting 0.059 mrem/hr at 1 meter. Accordingly, he was  considered safe for hospital discharge.  PLAN: The patient will return to the radiation oncology clinic for post implant CT dosimetry in three weeks.

## 2012-09-29 NOTE — Op Note (Signed)
Preoperative diagnosis: Clinically localized adenocarcinoma of the prostate (cT1c Nx Mx), history of bladder cancer  Postoperative diagnosis: Clinically localized adenocarcinoma of the prostate, history of bladder cancer  Procedure: 1) Transperineal placement of radioactive seeds into the prostate                     2) Cystoscopy  Surgeon: Moody Bruins. M.D.  Radiation oncologist: Chipper Herb, M.D.  Anesthesia: General  EBL: Minimal  Complications: None  Indication: Mason Scott is a 67 y.o. gentleman with clinically localized prostate cancer. After discussing management options for treatment, he elected to proceed with radiotherapy. He presents today for the above procedures. The potential risks, complications, alternative options, and expected recovery course have been discussed in detail with the patient and he has provided informed consent to proceed.  Description of procedure: The patient was taken to the operating room and general anesthesia was induced. He was administered preoperative antibiotics, placed in the dorsal lithotomy position, and prepped and draped in the usual sterile fashion. Next, intraoperative transrectal ultrasonography was utilized for real-time intraoperative planning by the radiation oncology team. Once the treatment plan was completed, radiation seeds were placed utilizing a brachytherapy perineal template and the robotic Nucletron was utilized to place 65 radioactive iodine 125 seeds into the prostate through 26 needles.  Position of the radiation seeds was confirmed on fluoroscopic imaging.  Flexible cystoscopy was then performed and no seeds were identified within the bladder.  No bladder tumors, stones, or other mucosal pathology was identified within the bladder. A urethral catheter was inserted at the end of the procedure.  He tolerated the procedure well and without complications. He was able to be transferred to the recovery until is  satisfactory condition.

## 2012-09-29 NOTE — Progress Notes (Signed)
CC Dr. Assunta Found, Dr. Heloise Purpura  Diagnosis stage TI C. favorable risk adenocarcinoma prostate  Requesting physician: Dr. Heloise Purpura  Intent: Curative  Prostate seed implant date: 09/29/2012  Site/dose: Prostate 14,500 cGy. Isotope I-125 utilizing 65 seeds in 26 active needles. Individual seed activity 0.346 mCi per seed for a total implant activity of 25.48 mCi.  Narrative: Mason Scott appears to have undergone a successful Nucletron seed Selectron implant with Dr. Laverle Patter.  Plan: Followup visit with Dr. Laverle Patter and myself in approximately 3 weeks. We will obtain a CT scan at that time for his post implant dosimetry.

## 2012-09-29 NOTE — Anesthesia Postprocedure Evaluation (Signed)
Anesthesia Post Note  Patient: Mason Scott  Procedure(s) Performed: Procedure(s) (LRB): RADIOACTIVE SEED IMPLANT (N/A) CYSTOSCOPY FLEXIBLE (N/A)  Anesthesia type: General  Patient location: PACU  Post pain: Pain level controlled  Post assessment: Post-op Vital signs reviewed  Last Vitals: BP 123/76  Pulse 62  Temp(Src) 36 C (Oral)  Resp 16  Ht 5\' 10"  (1.778 m)  Wt 210 lb (95.255 kg)  BMI 30.13 kg/m2  SpO2 97%  Post vital signs: Reviewed  Level of consciousness: sedated  Complications: No apparent anesthesia complications

## 2012-09-29 NOTE — Anesthesia Procedure Notes (Signed)
Procedure Name: LMA Insertion Date/Time: 09/29/2012 2:43 PM Performed by: Jessica Priest Pre-anesthesia Checklist: Patient identified, Emergency Drugs available, Suction available and Patient being monitored Patient Re-evaluated:Patient Re-evaluated prior to inductionOxygen Delivery Method: Circle System Utilized Preoxygenation: Pre-oxygenation with 100% oxygen Intubation Type: IV induction Ventilation: Mask ventilation without difficulty LMA: LMA with gastric port inserted LMA Size: 5.0 Number of attempts: 1 Placement Confirmation: positive ETCO2 Tube secured with: Tape Dental Injury: Teeth and Oropharynx as per pre-operative assessment

## 2012-09-29 NOTE — Transfer of Care (Signed)
Immediate Anesthesia Transfer of Care Note  Patient: Mason Scott  Procedure(s) Performed: Procedure(s) (LRB): RADIOACTIVE SEED IMPLANT (N/A) CYSTOSCOPY FLEXIBLE (N/A)  Patient Location: PACU  Anesthesia Type: General  Level of Consciousness: awake, sedated, patient cooperative and responds to stimulation  Airway & Oxygen Therapy: Patient Spontanous Breathing and Patient connected to face mask oxygen  Post-op Assessment: Report given to PACU RN, Post -op Vital signs reviewed and stable and Patient moving all extremities  Post vital signs: Reviewed and stable  Complications: No apparent anesthesia complications

## 2012-09-29 NOTE — Anesthesia Preprocedure Evaluation (Signed)
Anesthesia Evaluation  Patient identified by MRN, date of birth, ID band Patient awake    Reviewed: Allergy & Precautions, H&P , NPO status , Patient's Chart, lab work & pertinent test results  Airway Mallampati: II TM Distance: >3 FB Neck ROM: Full    Dental no notable dental hx.    Pulmonary sleep apnea and Continuous Positive Airway Pressure Ventilation ,  breath sounds clear to auscultation  Pulmonary exam normal       Cardiovascular hypertension, Rhythm:Regular Rate:Normal     Neuro/Psych negative neurological ROS  negative psych ROS   GI/Hepatic negative GI ROS, Neg liver ROS,   Endo/Other  negative endocrine ROS  Renal/GU negative Renal ROS  negative genitourinary   Musculoskeletal negative musculoskeletal ROS (+)   Abdominal   Peds negative pediatric ROS (+)  Hematology negative hematology ROS (+)   Anesthesia Other Findings   Reproductive/Obstetrics negative OB ROS                           Anesthesia Physical Anesthesia Plan  ASA: II  Anesthesia Plan: General   Post-op Pain Management:    Induction: Intravenous  Airway Management Planned: LMA and Oral ETT  Additional Equipment:   Intra-op Plan:   Post-operative Plan: Extubation in OR  Informed Consent: I have reviewed the patients History and Physical, chart, labs and discussed the procedure including the risks, benefits and alternatives for the proposed anesthesia with the patient or authorized representative who has indicated his/her understanding and acceptance.   Dental advisory given  Plan Discussed with: CRNA  Anesthesia Plan Comments:         Anesthesia Quick Evaluation

## 2012-09-30 ENCOUNTER — Encounter (HOSPITAL_BASED_OUTPATIENT_CLINIC_OR_DEPARTMENT_OTHER): Payer: Self-pay | Admitting: Urology

## 2012-10-18 ENCOUNTER — Telehealth: Payer: Self-pay | Admitting: *Deleted

## 2012-10-18 NOTE — Telephone Encounter (Signed)
CALLED PATIENT TO REMIND OF APPTS. FOR 10-19-12, LVM FOR A RETURN CALL 

## 2012-10-19 ENCOUNTER — Ambulatory Visit
Admission: RE | Admit: 2012-10-19 | Discharge: 2012-10-19 | Disposition: A | Discharge status: Home / Self Care | Payer: MEDICARE | Source: Ambulatory Visit | Attending: Radiation Oncology | Admitting: Radiation Oncology

## 2012-10-19 VITALS — BP 139/77 | HR 60 | Temp 98.1°F | Ht 70.0 in | Wt 215.0 lb

## 2012-10-19 DIAGNOSIS — C61 Malignant neoplasm of prostate: Secondary | ICD-10-CM

## 2012-10-19 NOTE — Progress Notes (Signed)
CC: Dr. Heloise Purpura, Dr. Assunta Found  Followup note: Mr. Smestad returns today approximately 3 weeks following his prostate seed implant with Dr. Laverle Patter in the management of his stage TI C. favorable risk adenocarcinoma prostate. He is doing well and is without GU or GI difficulty. He inquires about when he can resume normal sexual relations. He sees Dr. Laverle Patter next week.  His CT scan for his post implant dosimetry shows an excellent seed distribution.  Physical examination: Alert and oriented. Filed Vitals:   10/19/12 1109  BP:   Pulse: 60  Temp:    Rectal examination not performed today.  Impression: Satisfactory progress with minimal symptomatology following his seed implant. I explained that he can return to sexual relations anytime, should wear a condom for up to one month following his implant (one more week). We will move ahead with his post implant dosimetry. We discussed please PSA dynamics and also the possibility of a PSA bounce.  Plan: Followup visit with Dr. Laverle Patter next week. I've not scheduled Mr. Moder for a formal followup visit, and I asked that Dr. Laverle Patter keep me posted on his progress. We will for his post implant dosimetry to Dr. Laverle Patter in approximately 2-3 weeks.

## 2012-10-19 NOTE — Progress Notes (Signed)
Complex CT simulation note: The patient was taken to the CT simulator. His pelvis was scanned. The CT data set was sent to the [employer] system for contouring of his rectum and prostate. He'll then have 3-D simulation to assess the quality of his implant.

## 2012-10-19 NOTE — Progress Notes (Signed)
Mason Scott here with his wife for post seed follow up.  He denies pain and fatigue.  He states that he has not had any urinary issues like frequency, hesitancy and urgency since the seed placement.  He did notice a small amount of blood in his urine three days after the seed placement.  He has not noticed any more after that occurrence.

## 2012-10-21 ENCOUNTER — Encounter: Payer: Self-pay | Admitting: Radiation Oncology

## 2012-10-21 NOTE — Progress Notes (Signed)
CC Dr. Crecencio Mc, Dr. Assunta Found   Post implant CT dosimetry/ 3-D simulation note: The patient underwent post-implant CT dosimetry/3-D simulation to assess the quality of his implant. His intraoperative prostate volume by ultrasound was 31.0 cc and his postoperative prostate volume by CT was 33.67 cc, a good correlation. Dose volume histograms were obtained for the prostate and rectum. His prostate D 90 is 113% and V100 96.2%, both excellent. Only 0.06 cc of rectum received the prescribed dose of 14,500 cGy. In summary, Mason Scott has excellent post implant dosimetry and a low risk for late rectal toxicity.

## 2013-01-19 ENCOUNTER — Encounter: Payer: Self-pay | Admitting: *Deleted

## 2013-09-29 ENCOUNTER — Other Ambulatory Visit: Payer: Self-pay | Admitting: Urology

## 2013-10-11 ENCOUNTER — Encounter (HOSPITAL_COMMUNITY): Payer: Self-pay | Admitting: Pharmacy Technician

## 2013-10-13 ENCOUNTER — Other Ambulatory Visit (HOSPITAL_COMMUNITY): Payer: Self-pay | Admitting: Urology

## 2013-10-13 NOTE — Patient Instructions (Addendum)
21 Mason Scott  10/13/2013   Your procedure is scheduled on: Monday April 20th, 2015  Report to [facility] at  1145 AM.  Call this number if you have problems the morning of surgery [phone]   Remember:BRING CPAP MASK AND TUBING  Do not eat food :After Midnight.   clear liquids midnight until 815 am day of surgery, then nothing by mouth   Take these medicines the morning of surgery with A SIP OF WATER: no meds to take                               You may not have any metal on your body including hair pins and piercings  Do not wear jewelry, make-up, lotions, powders, or deodorant.   Men may shave face and neck.  Do not bring valuables to the hospital. [place].  Contacts, dentures or bridgework may not be worn into surgery.  Leave suitcase in the car. After surgery it may be brought to your room.  For patients admitted to the hospital, checkout time is 11:00 AM the day of discharge.   Patients discharged the day of surgery will not be allowed to drive home.  Name and phone number of your driver:wife judy cell [phone]  Special Instructions: N/A  [facility] - Preparing for Surgery Before surgery, you can play an important role.  Because skin is not sterile, your skin needs to be as free of germs as possible.  You can reduce the number of germs on your skin by washing with CHG (chlorahexidine gluconate) soap before surgery.  CHG is an antiseptic cleaner which kills germs and bonds with the skin to continue killing germs even after washing. Please DO NOT use if you have an allergy to CHG or antibacterial soaps.  If your skin becomes reddened/irritated stop using the CHG and inform your nurse when you arrive at Short Stay. Do not shave (including legs and underarms) for at least 48 hours prior to the first CHG shower.  You may shave your face. Please follow these instructions carefully:  1.  Shower with CHG Soap the night  before surgery and the  morning of Surgery.  2.  If you choose to wash your hair, wash your hair first as usual with your  normal  shampoo.  3.  After you shampoo, rinse your hair and body thoroughly to remove the  shampoo.                           4.  Use CHG as you would any other liquid soap.  You can apply chg directly  to the skin and wash                       Gently with a scrungie or clean washcloth.  5.  Apply the CHG Soap to your body ONLY FROM THE NECK DOWN.   Do not use on open                           Wound or open sores. Avoid contact with eyes, ears mouth and genitals (private parts).                        Genitals (private parts) with your normal  soap.             6.  Wash thoroughly, paying special attention to the area where your surgery  will be performed.  7.  Thoroughly rinse your body with warm water from the neck down.  8.  DO NOT shower/wash with your normal soap after using and rinsing off  the CHG Soap.                9.  Pat yourself dry with a clean towel.            10.  Wear clean pajamas.            11.  Place clean sheets on your bed the night of your first shower and do not  sleep with pets. Day of Surgery : Do not apply any lotions/deodorants the morning of surgery.  Please wear clean clothes to the hospital/surgery center.  FAILURE TO FOLLOW THESE INSTRUCTIONS MAY RESULT IN THE CANCELLATION OF YOUR SURGERY PATIENT SIGNATURE_________________________________  NURSE SIGNATURE__________________________________    CLEAR LIQUID DIET   Foods Allowed                                                                     Foods Excluded  Coffee and tea, regular and decaf                             liquids that you cannot  Plain Jell-O in any flavor                                             see through such as: Fruit ices (not with fruit pulp)                                     milk, soups, orange juice  Iced Popsicles                                    All  solid food Carbonated beverages, regular and diet                                    Cranberry, grape and apple juices Sports drinks like Gatorade Lightly seasoned clear broth or consume(fat free) Sugar, honey syrup  Sample Menu Breakfast                                Lunch                                     Supper Cranberry juice                    Beef broth  Chicken broth Jell-O                                     Grape juice                           Apple juice Coffee or tea                        Jell-O                                      Popsicle                                                Coffee or tea                        Coffee or tea

## 2013-10-16 ENCOUNTER — Ambulatory Visit (HOSPITAL_COMMUNITY)
Admission: RE | Admit: 2013-10-16 | Discharge: 2013-10-16 | Disposition: A | Discharge status: Home / Self Care | Payer: MEDICARE | Source: Ambulatory Visit | Attending: Urology | Admitting: Urology

## 2013-10-16 ENCOUNTER — Encounter (INDEPENDENT_AMBULATORY_CARE_PROVIDER_SITE_OTHER): Payer: Self-pay

## 2013-10-16 ENCOUNTER — Encounter (HOSPITAL_COMMUNITY): Payer: Self-pay

## 2013-10-16 ENCOUNTER — Encounter (HOSPITAL_COMMUNITY)
Admission: RE | Admit: 2013-10-16 | Discharge: 2013-10-16 | Disposition: A | Discharge status: Home / Self Care | Payer: MEDICARE | Source: Ambulatory Visit | Attending: Urology | Admitting: Urology

## 2013-10-16 DIAGNOSIS — Z01812 Encounter for preprocedural laboratory examination: Secondary | ICD-10-CM | POA: Insufficient documentation

## 2013-10-16 DIAGNOSIS — C61 Malignant neoplasm of prostate: Secondary | ICD-10-CM | POA: Insufficient documentation

## 2013-10-16 DIAGNOSIS — Z01810 Encounter for preprocedural cardiovascular examination: Secondary | ICD-10-CM | POA: Insufficient documentation

## 2013-10-16 DIAGNOSIS — C679 Malignant neoplasm of bladder, unspecified: Secondary | ICD-10-CM | POA: Insufficient documentation

## 2013-10-16 DIAGNOSIS — Z01818 Encounter for other preprocedural examination: Secondary | ICD-10-CM | POA: Insufficient documentation

## 2013-10-16 LAB — CBC
HEMATOCRIT: 38.5 % — AB (ref 39.0–52.0)
Hemoglobin: 13.4 g/dL (ref 13.0–17.0)
MCH: 31.1 pg (ref 26.0–34.0)
MCHC: 34.8 g/dL (ref 30.0–36.0)
MCV: 89.3 fL (ref 78.0–100.0)
Platelets: 214 10*3/uL (ref 150–400)
RBC: 4.31 MIL/uL (ref 4.22–5.81)
RDW: 12.2 % (ref 11.5–15.5)
WBC: 6.1 10*3/uL (ref 4.0–10.5)

## 2013-10-16 LAB — BASIC METABOLIC PANEL
BUN: 19 mg/dL (ref 6–23)
CHLORIDE: 100 meq/L (ref 96–112)
CO2: 28 meq/L (ref 19–32)
CREATININE: 1.11 mg/dL (ref 0.50–1.35)
Calcium: 9.2 mg/dL (ref 8.4–10.5)
GFR calc Af Amer: 78 mL/min — ABNORMAL LOW (ref >90)
GFR calc non Af Amer: 67 mL/min — ABNORMAL LOW (ref >90)
Glucose, Bld: 92 mg/dL (ref 70–99)
Potassium: 3.8 mEq/L (ref 3.7–5.3)
Sodium: 140 mEq/L (ref 137–147)

## 2013-10-20 NOTE — H&P (Signed)
  History of Present Illness [name] is a 68 year old with the following urologic history:    1) Prostate cancer: He was diagnosed with cT1cNx Mx, Gleason 3+3=6 adenocarcinoma of the prostate (pretreatment PSA 6.14) in November 2013 and is s/p treatment with brachytherapy (Dr. Arloa Koh) on 09/29/12.  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 He has had no recurrences. Sep 1991: TURBT, Thiotepa    3) Incontinence: He presented with new onset incontinence in July 2014 associated with urgency. This resolved spontaneously without the need for treatment.    Past Medical History Problems  1. History of Arthritis (V13.4) 2. History of hyperlipidemia (V12.29) 3. History of hypertension (V12.59) 4. History of sleep apnea (V13.89)  Surgical History Problems  1. History of Cystoscopy Bladder Tumor (596.9) 2. History of Surgery Prostate Transperineal Placement Of Needles  Current Meds 1. AmLODIPine Besylate 10 MG Oral Tablet;  Therapy: (KDXIPJAS:[id number]) to Recorded 2. Aspir-81 81 MG Oral Tablet Delayed Release;  Therapy: (ALPFXTKW:[id number]) to Recorded 3. Cialis 10 MG Oral Tablet; TAKE 1 TABLET As Directed;  Therapy: 23Oct2014 to (Evaluate:21Apr2015)  Requested for: 23Oct2014; Last  Rx:23Oct2014 Ordered 4. Cialis 5 MG Oral Tablet; TAKE 1 TABLET As Directed (Daily use tablets);  Therapy: 30Oct2014 to (Last Rx:30Oct2014)  Requested for: 30Oct2014 Ordered 5. CPAP;  Therapy: (JMEQASTM:19QQI2979) to Recorded 6. Diovan HCT 160-25 MG Oral Tablet;  Therapy: (GXQJJHER:74YCX4481) to Recorded 7. Fish Oil Concentrate 1000 MG Oral Capsule;  Therapy: (EHUDJSHF:02OVZ8588) to Recorded 8. Flomax 0.4 MG Oral Capsule;  Therapy: (FOYDXAJO:87OMV6720) to Recorded 9. Meloxicam 15 MG Oral Tablet;  Therapy: [id number] to Recorded 10. Saw  Palmetto 1000 MG Oral Capsule;   Therapy: (MOQHUTML:46TKP5465) to Recorded 11. Simvastatin 10 MG Oral Tablet;   Therapy: 22Jun2014 to Recorded 12. TraMADol HCl - 50 MG Oral Tablet;   Therapy: [id number] to Recorded  Allergies Medication  1. No Known Drug Allergies  Family History Problems  1. Family history of Breast Cancer (V16.3) : Mother 2. Denied: Family history of Prostate Cancer 3. Family history of Stroke Syndrome (V17.1) : Father  Social History Problems  1. Denied: History of Alcohol Use 2. Marital History - Currently Married 3. Never A Smoker  Vitals  Height: 5 ft 10 in Weight: 216 lb  BMI Calculated: 30.99 BSA Calculated: 2.16   Physical Exam Constitutional: Well nourished and well developed . No acute distress.  Genitourinary: Examination of the penis demonstrates no lesions and a normal meatus.      Assessment Assessed  1. Bladder cancer (188.9)     Discussion/Summary 1.  History of urothelial carcinoma of the bladder: We reviewed the cystoscopic changes identified on his recent cystoscopy. Bladder washing was obtained for cytology. His FISH was positive and we agreed to proceed with further evaluation including RPGs and biopsy of the bladder.

## 2013-10-23 ENCOUNTER — Encounter (HOSPITAL_COMMUNITY)
Admission: RE | Disposition: A | Discharge status: Home / Self Care | Payer: Self-pay | Source: Ambulatory Visit | Attending: Urology

## 2013-10-23 ENCOUNTER — Encounter (HOSPITAL_COMMUNITY): Payer: Self-pay | Admitting: *Deleted

## 2013-10-23 ENCOUNTER — Ambulatory Visit (HOSPITAL_COMMUNITY)
Admission: RE | Admit: 2013-10-23 | Discharge: 2013-10-23 | Disposition: A | Discharge status: Home / Self Care | Payer: MEDICARE | Source: Ambulatory Visit | Attending: Urology | Admitting: Urology

## 2013-10-23 ENCOUNTER — Ambulatory Visit (HOSPITAL_COMMUNITY): Payer: MEDICARE | Admitting: Anesthesiology

## 2013-10-23 ENCOUNTER — Encounter (HOSPITAL_COMMUNITY): Payer: MEDICARE | Admitting: Anesthesiology

## 2013-10-23 DIAGNOSIS — M129 Arthropathy, unspecified: Secondary | ICD-10-CM | POA: Insufficient documentation

## 2013-10-23 DIAGNOSIS — G4730 Sleep apnea, unspecified: Secondary | ICD-10-CM | POA: Insufficient documentation

## 2013-10-23 DIAGNOSIS — E785 Hyperlipidemia, unspecified: Secondary | ICD-10-CM | POA: Insufficient documentation

## 2013-10-23 DIAGNOSIS — Z8546 Personal history of malignant neoplasm of prostate: Secondary | ICD-10-CM | POA: Diagnosis not present

## 2013-10-23 DIAGNOSIS — Z9221 Personal history of antineoplastic chemotherapy: Secondary | ICD-10-CM | POA: Insufficient documentation

## 2013-10-23 DIAGNOSIS — D090 Carcinoma in situ of bladder: Secondary | ICD-10-CM | POA: Insufficient documentation

## 2013-10-23 DIAGNOSIS — I10 Essential (primary) hypertension: Secondary | ICD-10-CM | POA: Insufficient documentation

## 2013-10-23 DIAGNOSIS — Z79899 Other long term (current) drug therapy: Secondary | ICD-10-CM | POA: Insufficient documentation

## 2013-10-23 DIAGNOSIS — Z7982 Long term (current) use of aspirin: Secondary | ICD-10-CM | POA: Insufficient documentation

## 2013-10-23 DIAGNOSIS — Z923 Personal history of irradiation: Secondary | ICD-10-CM | POA: Insufficient documentation

## 2013-10-23 DIAGNOSIS — C672 Malignant neoplasm of lateral wall of bladder: Secondary | ICD-10-CM | POA: Diagnosis not present

## 2013-10-23 HISTORY — PX: CYSTOSCOPY WITH BIOPSY: SHX5122

## 2013-10-23 SURGERY — CYSTOSCOPY, WITH BIOPSY
Anesthesia: General

## 2013-10-23 MED ORDER — ONDANSETRON HCL 4 MG/2ML IJ SOLN
INTRAMUSCULAR | Status: AC
  Filled 2013-10-23: qty 2

## 2013-10-23 MED ORDER — DEXAMETHASONE SODIUM PHOSPHATE 10 MG/ML IJ SOLN
INTRAMUSCULAR | Status: AC
  Filled 2013-10-23: qty 1

## 2013-10-23 MED ORDER — CISATRACURIUM BESYLATE 20 MG/10ML IV SOLN
INTRAVENOUS | Status: AC
  Filled 2013-10-23: qty 10

## 2013-10-23 MED ORDER — PHENAZOPYRIDINE HCL 100 MG PO TABS
100.0000 mg | ORAL_TABLET | Freq: Once | ORAL | Status: AC | PRN
  Administered 2013-10-23: 100 mg via ORAL
  Filled 2013-10-23 (×2): qty 1

## 2013-10-23 MED ORDER — ONDANSETRON HCL 4 MG/2ML IJ SOLN
INTRAMUSCULAR | Status: DC | PRN
  Administered 2013-10-23: 4 mg via INTRAVENOUS

## 2013-10-23 MED ORDER — LACTATED RINGERS IV SOLN
INTRAVENOUS | Status: DC
  Administered 2013-10-23: 1000 mL via INTRAVENOUS

## 2013-10-23 MED ORDER — CISATRACURIUM BESYLATE (PF) 10 MG/5ML IV SOLN
INTRAVENOUS | Status: DC | PRN
  Administered 2013-10-23: 2 mg via INTRAVENOUS

## 2013-10-23 MED ORDER — CIPROFLOXACIN IN D5W 400 MG/200ML IV SOLN
400.0000 mg | INTRAVENOUS | Status: AC
  Administered 2013-10-23: 400 mg via INTRAVENOUS

## 2013-10-23 MED ORDER — HYDROCODONE-ACETAMINOPHEN 5-325 MG PO TABS: 1.0000 | ORAL_TABLET | Freq: Four times a day (QID) | ORAL | Status: DC | PRN

## 2013-10-23 MED ORDER — IOHEXOL 300 MG/ML  SOLN
INTRAMUSCULAR | Status: DC | PRN
Start: 2013-10-23 — End: 2013-10-23
  Administered 2013-10-23: 7 mL via URETHRAL

## 2013-10-23 MED ORDER — FENTANYL CITRATE 0.05 MG/ML IJ SOLN: 25.0000 ug | INTRAMUSCULAR | Status: DC | PRN

## 2013-10-23 MED ORDER — HYDROCODONE-ACETAMINOPHEN 5-325 MG PO TABS: 1.0000 | ORAL_TABLET | Freq: Once | ORAL | Status: DC | PRN

## 2013-10-23 MED ORDER — SODIUM CHLORIDE 0.9 % IR SOLN
Status: DC | PRN
  Administered 2013-10-23: 3000 mL

## 2013-10-23 MED ORDER — FENTANYL CITRATE 0.05 MG/ML IJ SOLN
INTRAMUSCULAR | Status: AC
  Filled 2013-10-23: qty 5

## 2013-10-23 MED ORDER — FENTANYL CITRATE 0.05 MG/ML IJ SOLN
INTRAMUSCULAR | Status: DC | PRN
  Administered 2013-10-23: 25 ug via INTRAVENOUS
  Administered 2013-10-23: 50 ug via INTRAVENOUS
  Administered 2013-10-23: 25 ug via INTRAVENOUS

## 2013-10-23 MED ORDER — PROMETHAZINE HCL 25 MG/ML IJ SOLN: 6.2500 mg | INTRAMUSCULAR | Status: DC | PRN

## 2013-10-23 MED ORDER — DEXAMETHASONE SODIUM PHOSPHATE 10 MG/ML IJ SOLN
INTRAMUSCULAR | Status: DC | PRN
  Administered 2013-10-23: 10 mg via INTRAVENOUS

## 2013-10-23 MED ORDER — PHENAZOPYRIDINE HCL 100 MG PO TABS: 100.0000 mg | ORAL_TABLET | Freq: Three times a day (TID) | ORAL | Status: DC | PRN

## 2013-10-23 MED ORDER — PROPOFOL 10 MG/ML IV BOLUS
INTRAVENOUS | Status: DC | PRN
  Administered 2013-10-23: 200 mg via INTRAVENOUS

## 2013-10-23 MED ORDER — STERILE WATER FOR IRRIGATION IR SOLN
Status: DC | PRN
  Administered 2013-10-23: 3000 mL

## 2013-10-23 MED ORDER — PROPOFOL 10 MG/ML IV BOLUS
INTRAVENOUS | Status: AC
  Filled 2013-10-23: qty 20

## 2013-10-23 MED ORDER — CIPROFLOXACIN IN D5W 400 MG/200ML IV SOLN
INTRAVENOUS | Status: AC
  Filled 2013-10-23: qty 200

## 2013-10-23 MED ORDER — CIPROFLOXACIN HCL 250 MG PO TABS: 250.0000 mg | ORAL_TABLET | Freq: Two times a day (BID) | ORAL | Status: DC

## 2013-10-23 SURGICAL SUPPLY — 15 items
BAG URO CATCHER STRL LF (DRAPE) ×3 IMPLANT
BASKET ZERO TIP NITINOL 2.4FR (BASKET) IMPLANT
BSKT STON RTRVL ZERO TP 2.4FR (BASKET)
CATH INTERMIT  6FR 70CM (CATHETERS) ×2 IMPLANT
DRAPE CAMERA CLOSED 9X96 (DRAPES) ×3 IMPLANT
ELECT LOOP MED HF 24F 12D (CUTTING LOOP) ×2 IMPLANT
ELECT LOOP MED HF 24F 12D CBL (CLIP) ×2 IMPLANT
GLOVE BIOGEL M STRL SZ7.5 (GLOVE) ×7 IMPLANT
GOWN STRL REUS W/TWL LRG LVL3 (GOWN DISPOSABLE) ×4 IMPLANT
GUIDEWIRE ANG ZIPWIRE 038X150 (WIRE) IMPLANT
GUIDEWIRE STR DUAL SENSOR (WIRE) ×1 IMPLANT
MANIFOLD NEPTUNE II (INSTRUMENTS) ×3 IMPLANT
PACK CYSTO (CUSTOM PROCEDURE TRAY) ×3 IMPLANT
TUBING CONNECTING 10 (TUBING) ×2 IMPLANT
TUBING CONNECTING 10' (TUBING) ×1

## 2013-10-23 NOTE — Anesthesia Preprocedure Evaluation (Addendum)
Anesthesia Evaluation  Patient identified by MRN, date of birth, ID band Patient awake    Reviewed: Allergy & Precautions, H&P , NPO status , Patient's Chart, lab work & pertinent test results  Airway Mallampati: II TM Distance: >3 FB Neck ROM: Full    Dental  (+) Chipped,    Pulmonary sleep apnea and Continuous Positive Airway Pressure Ventilation ,  breath sounds clear to auscultation  Pulmonary exam normal       Cardiovascular hypertension, Pt. on medications Rhythm:Regular Rate:Normal     Neuro/Psych negative neurological ROS  negative psych ROS   GI/Hepatic negative GI ROS, Neg liver ROS,   Endo/Other  negative endocrine ROS  Renal/GU negative Renal ROS  negative genitourinary   Musculoskeletal negative musculoskeletal ROS (+)   Abdominal   Peds negative pediatric ROS (+)  Hematology negative hematology ROS (+)   Anesthesia Other Findings   Reproductive/Obstetrics negative OB ROS                          Anesthesia Physical Anesthesia Plan  ASA: II  Anesthesia Plan: General   Post-op Pain Management:    Induction: Intravenous  Airway Management Planned: LMA  Additional Equipment:   Intra-op Plan:   Post-operative Plan: Extubation in OR  Informed Consent: I have reviewed the patients History and Physical, chart, labs and discussed the procedure including the risks, benefits and alternatives for the proposed anesthesia with the patient or authorized representative who has indicated his/her understanding and acceptance.   Dental advisory given  Plan Discussed with: CRNA  Anesthesia Plan Comments:         Anesthesia Quick Evaluation

## 2013-10-23 NOTE — Anesthesia Postprocedure Evaluation (Signed)
  Anesthesia Post-op Note  Patient: Mason Scott  Procedure(s) Performed: Procedure(s) (LRB): CYSTOSCOPY WITH BIOPSY, bilateral retrograde, transurethral resection of  bladder tumor  (N/A)  Patient Location: PACU  Anesthesia Type: General  Level of Consciousness: awake and alert   Airway and Oxygen Therapy: Patient Spontanous Breathing  Post-op Pain: mild  Post-op Assessment: Post-op Vital signs reviewed, Patient's Cardiovascular Status Stable, Respiratory Function Stable, Patent Airway and No signs of Nausea or vomiting  Last Vitals:  Filed Vitals:   10/23/13 1513  BP: 159/73  Pulse: 58  Temp: 36.7 C  Resp: 18    Post-op Vital Signs: stable   Complications: No apparent anesthesia complications

## 2013-10-23 NOTE — Op Note (Signed)
Preoperative diagnosis: 1. History of urothelial carcinoma of the bladder 2. Positive urine tumor marker  Postoperative diagnosis:  1. Bladder tumor (2.5 cm)  Procedure:  1. Cystoscopy 2. Transurethral resection of bladder tumor (2.5 cm) 3. Bilateral retrograde pyelography with interpretation  Surgeon: Pryor Curia. M.D.  Anesthesia: General  Complications: None  Intraoperative findings:  1. Bladder tumor: There was noted to be a cluster of tumor over the left lateral bladder wall toward the bladder neck.  The cluster tumor measured about 2.5 cm in diameter and appeared to be mostly flat. 2. Retrograde pyelography: The ureters were normal bilaterally without fixed filling defects.  There were a couple of air bubbles noted within the right ureter but were note fixed.  There were no filling defects of other abnormalities of the renal collecting systems  EBL: Minimal  Specimens: 1. Left lateral bladder tumor 2. Base of left lateral bladder tumor  Disposition of specimens: Pathology  Indication: Mason Scott is a patient with a distant history of urothelial carcinoma and recent abnormal urine tumor markers. After reviewing the management options for treatment, he elected to proceed with the above surgical procedure(s). We have discussed the potential benefits and risks of the procedure, side effects of the proposed treatment, the likelihood of the patient achieving the goals of the procedure, and any potential problems that might occur during the procedure or recuperation. Informed consent has been obtained.  Description of procedure:  The patient was taken to the operating room and general anesthesia was induced.  The patient was placed in the dorsal lithotomy position, prepped and draped in the usual sterile fashion, and preoperative antibiotics were administered. A preoperative time-out was performed.   Cystourethroscopy was performed.  The patient's urethra was  examined and was normal.   The bladder was then systematically examined in its entirety. There was noted to be a cluster of flat erythematous tumor located on the left lateral bladder wall but no other abnormalities noted within the bladder.  Attention then turned to the right ureteral orifice and a ureteral catheter was used to intubate the ureteral orifice.  Omnipaque contrast was injected through the ureteral catheter and a retrograde pyelogram was performed with findings as dictated above.  Attention then turned to the left ureteral orifice and a ureteral catheter was used to intubate the ureteral orifice.  Omnipaque contrast was injected through the ureteral catheter and a retrograde pyelogram was performed with findings as dictated above.  The bladder was then re-examined after the resectoscope was placed.  The bladder tumor was  2.5 cm.  It was located on the left lateral wall and appeared flat. Using loop cautery resection, the entire tumor was resected and removed for permanent pathologic analysis.   Separate biopsies were also taken of the underlying deep bladder tissue and sent as a separate specimen.   Hemostasis was then achieved with the loop cautery and the bladder was emptied and reinspected with no further bleeding noted at the end of the procedure.    The bladder was then emptied and the procedure ended.  The patient appeared to tolerate the procedure well and without complications.  The patient was able to be awakened and transferred to the recovery unit in satisfactory condition.    Pryor Curia MD

## 2013-10-23 NOTE — Discharge Instructions (Signed)
1. You may see some blood in the urine and may have some burning with urination for 48-72 hours. You also may notice that you have to urinate more frequently or urgently after your procedure which is normal.  2. You should call should you develop an inability urinate, fever > 101, persistent nausea and vomiting that prevents you from eating or drinking to stay hydrated.       3.     You may resume your aspirin, anti-inflammatory medications, and supplements in one week if no blood in the urine is present at that time.

## 2013-10-23 NOTE — Transfer of Care (Signed)
Immediate Anesthesia Transfer of Care Note  Patient: Mason Scott  Procedure(s) Performed: Procedure(s): CYSTOSCOPY WITH BIOPSY, bilateral retrograde, transurethral resection of  bladder tumor  (N/A)  Patient Location: PACU  Anesthesia Type:General  Level of Consciousness: awake, alert , oriented and patient cooperative  Airway & Oxygen Therapy: Patient Spontanous Breathing and Patient connected to face mask oxygen  Post-op Assessment: Report given to PACU RN and Post -op Vital signs reviewed and stable  Post vital signs: Reviewed and stable  Complications: No apparent anesthesia complications

## 2013-10-24 ENCOUNTER — Encounter (HOSPITAL_COMMUNITY): Payer: Self-pay | Admitting: Urology

## 2014-02-23 ENCOUNTER — Other Ambulatory Visit: Payer: Self-pay | Admitting: Urology

## 2014-02-27 ENCOUNTER — Encounter (HOSPITAL_COMMUNITY): Payer: Self-pay | Admitting: Pharmacy Technician

## 2014-02-27 ENCOUNTER — Other Ambulatory Visit (HOSPITAL_COMMUNITY): Payer: Self-pay | Admitting: *Deleted

## 2014-02-27 HISTORY — PX: BASAL CELL CARCINOMA EXCISION: SHX1214

## 2014-02-27 NOTE — Patient Instructions (Addendum)
ADRIN JULIAN  02/27/2014                           YOUR PROCEDURE IS SCHEDULED ON: 03/08/14               ENTER THRU [facility] ENTRANCE AND                            FOLLOW  SIGNS TO SHORT STAY CENTER                 ARRIVE AT SHORT STAY AT: 7:00 AM               CALL THIS NUMBER IF ANY PROBLEMS THE DAY OF SURGERY :               [phone]                                REMEMBER:   Do not eat food or drink liquids AFTER MIDNIGHT                  Take these medicines the morning of surgery with               A SIPS OF WATER :      none               BRING C PAP MASK AND TUBING TO HOSPITAL   Do not wear jewelry, make-up   Do not wear lotions, powders, or perfumes.   Do not shave legs or underarms 12 hrs. before surgery (men may shave face)  Do not bring valuables to the hospital.  Contacts, dentures or bridgework may not be worn into surgery.  Leave suitcase in the car. After surgery it may be brought to your room.  For patients admitted to the hospital more than one night, checkout time is            11:00 AM                                                       The day of discharge.   Patients discharged the day of surgery will not be allowed to drive home.            If going home same day of surgery, must have someone stay with you              FIRST 24 hrs at home and arrange for some one to drive you              home from hospital.   ________________________________________________________________________                                                                        [place]  Before surgery, you can play an important role.  Because skin is not sterile, your skin  needs to be as free of germs as possible.  You can reduce the number of germs on your skin by washing with CHG (chlorahexidine gluconate) soap before surgery.  CHG is an antiseptic cleaner which kills germs and bonds with the skin to  continue killing germs even after washing. Please DO NOT use if you have an allergy to CHG or antibacterial soaps.  If your skin becomes reddened/irritated stop using the CHG and inform your nurse when you arrive at Short Stay. Do not shave (including legs and underarms) for at least 48 hours prior to the first CHG shower.  You may shave your face. Please follow these instructions carefully:   1.  Shower with CHG Soap the night before surgery and the  morning of Surgery.   2.  If you choose to wash your hair, wash your hair first as usual with your  normal  Shampoo.   3.  After you shampoo, rinse your hair and body thoroughly to remove the  shampoo.                                         4.  Use CHG as you would any other liquid soap.  You can apply chg directly  to the skin and wash . Gently wash with scrungie or clean wascloth    5.  Apply the CHG Soap to your body ONLY FROM THE NECK DOWN.   Do not use on open                           Wound or open sores. Avoid contact with eyes, ears mouth and genitals (private parts).                        Genitals (private parts) with your normal soap.              6.  Wash thoroughly, paying special attention to the area where your surgery  will be performed.   7.  Thoroughly rinse your body with warm water from the neck down.   8.  DO NOT shower/wash with your normal soap after using and rinsing off  the CHG Soap .                9.  Pat yourself dry with a clean towel.             10.  Wear clean pajamas.             11.  Place clean sheets on your bed the night of your first shower and do not  sleep with pets.  Day of Surgery : Do not apply any lotions/deodorants the morning of surgery.  Please wear clean clothes to the hospital/surgery center.  FAILURE TO FOLLOW THESE INSTRUCTIONS MAY RESULT IN THE CANCELLATION OF YOUR SURGERY    PATIENT  SIGNATURE_________________________________  ______________________________________________________________________

## 2014-02-28 ENCOUNTER — Encounter (HOSPITAL_COMMUNITY)
Admission: RE | Admit: 2014-02-28 | Discharge: 2014-02-28 | Disposition: A | Discharge status: Home / Self Care | Payer: MEDICARE | Source: Ambulatory Visit | Attending: Urology | Admitting: Urology

## 2014-02-28 ENCOUNTER — Encounter (HOSPITAL_COMMUNITY): Payer: Self-pay

## 2014-02-28 DIAGNOSIS — Z01818 Encounter for other preprocedural examination: Secondary | ICD-10-CM | POA: Insufficient documentation

## 2014-02-28 DIAGNOSIS — C679 Malignant neoplasm of bladder, unspecified: Secondary | ICD-10-CM | POA: Diagnosis not present

## 2014-02-28 HISTORY — DX: Acute poliomyelitis, unspecified: A80.9

## 2014-02-28 LAB — BASIC METABOLIC PANEL
ANION GAP: 13 (ref 5–15)
BUN: 18 mg/dL (ref 6–23)
CO2: 26 mEq/L (ref 19–32)
Calcium: 9.3 mg/dL (ref 8.4–10.5)
Chloride: 97 mEq/L (ref 96–112)
Creatinine, Ser: 1.06 mg/dL (ref 0.50–1.35)
GFR calc Af Amer: 83 mL/min — ABNORMAL LOW (ref >90)
GFR, EST NON AFRICAN AMERICAN: 71 mL/min — AB (ref >90)
Glucose, Bld: 94 mg/dL (ref 70–99)
Potassium: 3.7 mEq/L (ref 3.7–5.3)
SODIUM: 136 meq/L — AB (ref 137–147)

## 2014-02-28 LAB — CBC
HEMATOCRIT: 39.9 % (ref 39.0–52.0)
HEMOGLOBIN: 13.7 g/dL (ref 13.0–17.0)
MCH: 30.7 pg (ref 26.0–34.0)
MCHC: 34.3 g/dL (ref 30.0–36.0)
MCV: 89.5 fL (ref 78.0–100.0)
Platelets: 231 10*3/uL (ref 150–400)
RBC: 4.46 MIL/uL (ref 4.22–5.81)
RDW: 12.2 % (ref 11.5–15.5)
WBC: 7.2 10*3/uL (ref 4.0–10.5)

## 2014-03-07 NOTE — Anesthesia Preprocedure Evaluation (Addendum)
Anesthesia Evaluation  Patient identified by MRN, date of birth, ID band Patient awake    Reviewed: Allergy & Precautions, H&P , NPO status , Patient's Chart, lab work & pertinent test results  History of Anesthesia Complications Negative for: history of anesthetic complications  Airway Mallampati: II  TM Distance: >3 FB Neck ROM: Full    Dental  (+) Chipped,    Pulmonary sleep apnea and Continuous Positive Airway Pressure Ventilation ,  breath sounds clear to auscultation        Cardiovascular Exercise Tolerance: Good hypertension, Pt. on medications Rhythm:Regular Rate:Normal  HLD   Neuro/Psych negative neurological ROS  negative psych ROS   GI/Hepatic negative GI ROS, Neg liver ROS,   Endo/Other  negative endocrine ROS  Renal/GU negative Renal ROS   Bladder ca, prostate ca    Musculoskeletal  (+) Arthritis -,   Abdominal Normal abdominal exam  (+)  Abdomen: soft.    Peds negative pediatric ROS (+)  Hematology negative hematology ROS (+)   Anesthesia Other Findings   Reproductive/Obstetrics negative OB ROS                             Anesthesia Physical  Anesthesia Plan  ASA: II  Anesthesia Plan: General   Post-op Pain Management:    Induction: Intravenous  Airway Management Planned: LMA  Additional Equipment:   Intra-op Plan:   Post-operative Plan: Extubation in OR  Informed Consent: I have reviewed the patients History and Physical, chart, labs and discussed the procedure including the risks, benefits and alternatives for the proposed anesthesia with the patient or authorized representative who has indicated his/her understanding and acceptance.   Dental advisory given  Plan Discussed with: CRNA  Anesthesia Plan Comments:         Anesthesia Quick Evaluation  

## 2014-03-07 NOTE — H&P (Signed)
History of Present Illness [name] is a 68 year old with the following urologic history:    1) Prostate cancer: He was diagnosed with cT1cNx Mx, Gleason 3+3=6 adenocarcinoma of the prostate (pretreatment PSA 6.14) in November 2013 and is s/p treatment with brachytherapy (Dr. Arloa Koh) on 09/29/12.  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 He has had no recurrences.    Sep 1991: TURBT, Thiotepa  Mar 2015: Erythematous changes and atypical cytology with positive FISH  Apr 2015: TURBT: High grade, Ta urothelial carcinoma of left lateral bladder  Jun-Jul 2015: Induction BCG    3) Incontinence: He presented with new onset incontinence in July 2014 associated with urgency. This resolved spontaneously without the need for treatment.    Interval history:    He follows up today for surveillance of his urothelial carcinoma after completing induction BCG. He tolerated BCG quite well with expected symptoms of urinary frequency and urgency although these have now resolved. He denied any fever or more severe symptoms associated with treatment. He has denied any hematuria.     Past Medical History Problems  1. History of Arthritis (V13.4) 2. History of hyperlipidemia (V12.29) 3. History of hypertension (V12.59) 4. History of sleep apnea (V13.89)  Surgical History Problems  1. History of Cystoscopy Bladder Tumor (596.9) 2. History of Cystoscopy With Fulguration Medium Lesion (2-5cm) 3. History of Surgery Prostate Transperineal Placement Of Needles  Current Meds 1. AmLODIPine Besylate 10 MG Oral Tablet;  Therapy: (YQMVHQIO:96EXB2841) to Recorded 2. Aspir-81 81 MG Oral Tablet Delayed Release;  Therapy: (LKGMWNUU:72ZDG6440) to Recorded 3. CPAP;  Therapy: (HKVQQVZD:63OVF6433) to Recorded 4. Fish Oil Concentrate 1000  MG Oral Capsule;  Therapy: (IRJJOACZ:[id number]) to Recorded 5. Levofloxacin 500 MG Oral Tablet; Take 1 tablet daily;  Therapy: 10XNA3557 to (Complete:24May2015)  Requested for: 32KGU5427; Last  Rx:19May2015 Ordered 6. Meloxicam 15 MG Oral Tablet; takes 1/2 qd;  Therapy: 21Jan2014 to Recorded 7. Simvastatin 10 MG Oral Tablet; takes 1/2 qd;  Therapy: 22Jun2014 to Recorded 8. Valsartan-Hydrochlorothiazide 160-25 MG Oral Tablet;  Therapy: (Recorded:06May2015) to Recorded  Allergies Medication  1. No Known Drug Allergies  Family History Problems  1. Family history of Breast Cancer (V16.3) : Mother 2. Denied: Family history of Prostate Cancer 3. Family history of Stroke Syndrome (V17.1) : Father  Social History Problems  1. Denied: History of Alcohol Use 2. Marital History - Currently Married 3. Never A Smoker  Vitals Vital Signs [Data Includes: Last 1 Day]  Recorded: [id number] 10:06AM  Blood Pressure: 152 / 82 Temperature: 97.3 F Heart Rate: 55  Physical Exam Constitutional: Well nourished and well developed . No acute distress.  Genitourinary: Examination of the penis demonstrates no lesions and a normal meatus.    Results/Data Selected Results  UA With REFLEX [id number] 09:17AM Raynelle Bring  SPECIMEN TYPE: CLEAN CATCH   Test Name Result Flag Reference  COLOR YELLOW  YELLOW  APPEARANCE CLEAR  CLEAR  SPECIFIC GRAVITY <1.005 L 1.005-1.030  pH 5.5  5.0-8.0  GLUCOSE NEG mg/dL  NEG  BILIRUBIN NEG  NEG  KETONE NEG mg/dL  NEG  BLOOD SMALL A NEG  PROTEIN NEG mg/dL  NEG  UROBILINOGEN 0.2 mg/dL  0.0-1.0  NITRITE NEG  NEG  LEUKOCYTE ESTERASE NEG  NEG  SQUAMOUS EPITHELIAL/HPF NONE SEEN  RARE  WBC NONE SEEN WBC/hpf  <3  RBC 3-6 RBC/hpf  A <3  BACTERIA NONE SEEN  RARE  CRYSTALS NONE SEEN  NONE SEEN  CASTS NONE SEEN  NONE SEEN   Procedure  Procedure: Cystoscopy  Chaperone Present: Heather S.  Indication: History of Urothelial Carcinoma.  Informed Consent: Risks,  benefits, and potential adverse events were discussed and informed consent was obtained from the patient.  Prep: The patient was prepped with betadine.  Anesthesia:. Local anesthesia was administered intraurethrally with 2% lidocaine jelly.  Antibiotic prophylaxis: Ciprofloxacin.  Procedure Note:  Urethral meatus:. No abnormalities.  Anterior urethra: No abnormalities.  Prostatic urethra:. The lateral prostatic lobes were enlarged.  Bladder: Visulization was clear. The ureteral orifices were in the normal anatomic position bilaterally and had clear efflux of urine. A systematic survey of the bladder demonstrated no bladder tumors or stones. The previous resection site toward the left of the trigone is noted without evidence of recurrence. There are no other bladder tumors or other abnormalities noted on systematic examination of the bladder. A saline bladder washing was obtained and sent for cytologic analysis. The patient tolerated the procedure well.  Complications: None.    Assessment  Bladder cancer (188.9) (C67.9)   Plan Malignant neoplasm of other specified sites of bladder  1. BCG; Status:Hold For - Appointment,Date of Service; Requested for:19Aug2015;  2. Cysto; Status:Hold For - Appointment,Date of Service; Requested for:19Aug2015;  3. URINE CYTOLOGY; Status:In Progress - Specimen/Data Collected;   Done: [id number] 4. Follow-up Month x 3 Office  Follow-up  Status: Hold For - Appointment,Date of Service   Requested for: [phone] Prostate cancer  5. PSA; Status:Hold For - Specimen/Data Collection,Appointment; Requested  for:19Aug2015;   Discussion/Summary 1. Urothelial carcinoma of the bladder: He has no evidence of cystoscopic recurrence. A bladder washing has been obtained for cytology. Assuming this is normal, I have recommended that he begin maintenance BCG with plans to begin next week. He will then follow up in 3 months for cystoscopic surveillance.    2. Prostate cancer:  He will need to have his PSA drawn prior to his cystoscopy. He understands that this can be drawn the same day but must be performed prior to a cystoscopy to avoid a false positive.    3. Erectile dysfunction: Continue Cialis prn.    Cc: Dr. Sharilyn Sites     Verified Results URINE CYTOLOGY1 [id number] 10:48AM1 [employer]  SPECIMEN TYPE: OTHER  [Feb 22, 2014 7:06PM Sheriden Archibeque, [place] Patient was informed of his cytology result which is suspicious for malignancy. I recommended that we cancel his maintenance BCG treatments and plan proceed with further evaluation in the operating room including cystoscopy, exam under anesthesia, and bladder biopsies. We reviewed the potential risks, complications, and expected recovery process. He gives his informed consent to proceed.   Test Name Result Flag Reference  FINAL DIAGNOSIS:1  A1   - CELLS PRESENT SUSPICIOUS FOR MALIGNANCY. - ATYPICAL UROTHELIAL CELLS ARE PRESENT. DUE TO THE DIAGNOSIS RENDERED ON THIS PATIENT WE REQUEST THAT THE CYTOLOGY LABORATORY BE PROVIDED WITH ADDITIONAL FOLLOW UP ON THE PATIENT WHEN AVAILABLE.  SOURCE:1 Bladder Washing1    110CC OF YELLOW BLW RECEIVED IN FIXATIVE 1 SLIDE PREPARED [phone]  Relevant Clinical Info1     MALIGNANT NEOPLASM OF OTHER SPECIFIED SITES OF BLADDER  PATHOLOGIST:1     REVIEWED BY VALERIE J. FIELDS, MD, FCAP (ELECTRONIC SIGNATURE ON FILE)  NUMBER OF SLIDES1     1 Container Submitted  CYTOTECHNOLOGIST:1     LDL, MS CT(ASCP)     1. Amended By: Raynelle Bring;  Feb 22 2014 7:06 PM EST  Signatures Electronically signed by : Raynelle Bring, M.D.; Feb 22 2014  7:06PM EST

## 2014-03-08 ENCOUNTER — Encounter (HOSPITAL_COMMUNITY): Payer: MEDICARE | Admitting: Anesthesiology

## 2014-03-08 ENCOUNTER — Ambulatory Visit (HOSPITAL_COMMUNITY): Payer: MEDICARE | Admitting: Anesthesiology

## 2014-03-08 ENCOUNTER — Ambulatory Visit (HOSPITAL_COMMUNITY)
Admission: RE | Admit: 2014-03-08 | Discharge: 2014-03-08 | Disposition: A | Discharge status: Home / Self Care | Payer: MEDICARE | Source: Ambulatory Visit | Attending: Urology | Admitting: Urology

## 2014-03-08 ENCOUNTER — Encounter (HOSPITAL_COMMUNITY)
Admission: RE | Disposition: A | Discharge status: Home / Self Care | Payer: Self-pay | Source: Ambulatory Visit | Attending: Urology

## 2014-03-08 ENCOUNTER — Encounter (HOSPITAL_COMMUNITY): Payer: Self-pay

## 2014-03-08 DIAGNOSIS — M129 Arthropathy, unspecified: Secondary | ICD-10-CM | POA: Insufficient documentation

## 2014-03-08 DIAGNOSIS — G4730 Sleep apnea, unspecified: Secondary | ICD-10-CM | POA: Diagnosis not present

## 2014-03-08 DIAGNOSIS — I10 Essential (primary) hypertension: Secondary | ICD-10-CM | POA: Insufficient documentation

## 2014-03-08 DIAGNOSIS — C671 Malignant neoplasm of dome of bladder: Secondary | ICD-10-CM | POA: Diagnosis present

## 2014-03-08 HISTORY — PX: CYSTOSCOPY WITH BIOPSY: SHX5122

## 2014-03-08 SURGERY — CYSTOSCOPY, WITH BIOPSY
Anesthesia: General

## 2014-03-08 MED ORDER — DEXAMETHASONE SODIUM PHOSPHATE 10 MG/ML IJ SOLN
INTRAMUSCULAR | Status: DC | PRN
  Administered 2014-03-08: 10 mg via INTRAVENOUS

## 2014-03-08 MED ORDER — PHENAZOPYRIDINE HCL 100 MG PO TABS
100.0000 mg | ORAL_TABLET | Freq: Once | ORAL | Status: DC
Start: 2014-03-08 — End: 2014-03-08

## 2014-03-08 MED ORDER — DEXAMETHASONE SODIUM PHOSPHATE 10 MG/ML IJ SOLN
INTRAMUSCULAR | Status: AC
  Filled 2014-03-08: qty 1

## 2014-03-08 MED ORDER — MEPERIDINE HCL 50 MG/ML IJ SOLN: 6.2500 mg | INTRAMUSCULAR | Status: DC | PRN

## 2014-03-08 MED ORDER — LACTATED RINGERS IV SOLN
INTRAVENOUS | Status: DC
  Administered 2014-03-08: 1000 mL via INTRAVENOUS

## 2014-03-08 MED ORDER — FENTANYL CITRATE 0.05 MG/ML IJ SOLN: 25.0000 ug | INTRAMUSCULAR | Status: DC | PRN

## 2014-03-08 MED ORDER — FENTANYL CITRATE 0.05 MG/ML IJ SOLN
INTRAMUSCULAR | Status: DC | PRN
  Administered 2014-03-08 (×2): 50 ug via INTRAVENOUS

## 2014-03-08 MED ORDER — LIDOCAINE HCL (CARDIAC) 20 MG/ML IV SOLN
INTRAVENOUS | Status: AC
  Filled 2014-03-08: qty 5

## 2014-03-08 MED ORDER — PHENAZOPYRIDINE HCL 100 MG PO TABS: 100.0000 mg | ORAL_TABLET | Freq: Three times a day (TID) | ORAL | Status: DC | PRN

## 2014-03-08 MED ORDER — PROMETHAZINE HCL 25 MG/ML IJ SOLN: 6.2500 mg | INTRAMUSCULAR | Status: DC | PRN

## 2014-03-08 MED ORDER — PHENAZOPYRIDINE HCL 200 MG PO TABS: 200.0000 mg | ORAL_TABLET | ORAL | Status: DC

## 2014-03-08 MED ORDER — CIPROFLOXACIN IN D5W 400 MG/200ML IV SOLN
INTRAVENOUS | Status: AC
  Filled 2014-03-08: qty 200

## 2014-03-08 MED ORDER — ONDANSETRON HCL 4 MG/2ML IJ SOLN
INTRAMUSCULAR | Status: AC
  Filled 2014-03-08: qty 2

## 2014-03-08 MED ORDER — PROPOFOL 10 MG/ML IV BOLUS
INTRAVENOUS | Status: DC | PRN
  Administered 2014-03-08: 200 mg via INTRAVENOUS
  Administered 2014-03-08: 50 mg via INTRAVENOUS

## 2014-03-08 MED ORDER — PROPOFOL 10 MG/ML IV BOLUS
INTRAVENOUS | Status: AC
  Filled 2014-03-08: qty 20

## 2014-03-08 MED ORDER — PHENAZOPYRIDINE HCL 100 MG PO TABS
100.0000 mg | ORAL_TABLET | Freq: Once | ORAL | Status: AC
  Administered 2014-03-08: 100 mg via ORAL
  Filled 2014-03-08: qty 1

## 2014-03-08 MED ORDER — FENTANYL CITRATE 0.05 MG/ML IJ SOLN
INTRAMUSCULAR | Status: AC
  Filled 2014-03-08: qty 2

## 2014-03-08 MED ORDER — LIDOCAINE HCL (CARDIAC) 20 MG/ML IV SOLN
INTRAVENOUS | Status: DC | PRN
  Administered 2014-03-08: 60 mg via INTRAVENOUS

## 2014-03-08 MED ORDER — ONDANSETRON HCL 4 MG/2ML IJ SOLN
INTRAMUSCULAR | Status: DC | PRN
  Administered 2014-03-08: 4 mg via INTRAVENOUS

## 2014-03-08 MED ORDER — CIPROFLOXACIN IN D5W 400 MG/200ML IV SOLN
400.0000 mg | INTRAVENOUS | Status: AC
  Administered 2014-03-08: 400 mg via INTRAVENOUS

## 2014-03-08 SURGICAL SUPPLY — 14 items
BAG URO CATCHER STRL LF (DRAPE) ×3 IMPLANT
BASKET ZERO TIP NITINOL 2.4FR (BASKET) IMPLANT
BSKT STON RTRVL ZERO TP 2.4FR (BASKET)
CATH INTERMIT  6FR 70CM (CATHETERS) IMPLANT
CLOTH BEACON ORANGE TIMEOUT ST (SAFETY) ×3 IMPLANT
DRAPE CAMERA CLOSED 9X96 (DRAPES) ×3 IMPLANT
GLOVE BIOGEL M STRL SZ7.5 (GLOVE) ×3 IMPLANT
GOWN STRL REUS W/TWL LRG LVL3 (GOWN DISPOSABLE) ×6 IMPLANT
GUIDEWIRE ANG ZIPWIRE 038X150 (WIRE) IMPLANT
GUIDEWIRE STR DUAL SENSOR (WIRE) ×1 IMPLANT
MANIFOLD NEPTUNE II (INSTRUMENTS) ×3 IMPLANT
PACK CYSTO (CUSTOM PROCEDURE TRAY) ×3 IMPLANT
TUBING CONNECTING 10 (TUBING) ×2 IMPLANT
TUBING CONNECTING 10' (TUBING) ×1

## 2014-03-08 NOTE — Op Note (Signed)
Preoperative diagnosis:  1. History of urothelial carcinoma of bladder 2.  Suspicious urine cytology  Postoperative diagnosis: 1. History of urothelial carcinoma bladder 2.  Suspicious urine cytology  Procedure(s): 1. Cystoscopy 2.  Site selected bladder biopsies 3.  Pelvic exam under anesthesia  Surgeon: Dr. Roxy Horseman, Jr  Anesthesia: General  Complications: None  EBL: Minimal  Specimens: Bladder biopsies including a suspicious lesion of the dome of the bladder, an area toward the right lateral bladder, and an area toward the left lateral bladder.  Disposition of specimens: Pathology  Indication: Mason Scott is a 68 year old gentleman with a history of urothelial carcinoma of the bladder.  He was recently found to have a suspicious cytology and follow-up and presents today for further evaluation with cystoscopy and bladder biopsies.  Last underwent an upper tract evaluation a few months ago.  We reviewed the potential risks, complications, and alternative options associated with the above procedures.  He gave his informed consent to proceed.  Description of procedure:  He was taken to the operating room and administered general anesthesia.  He was given preoperative antibiotics and placed in the dorsal lithotomy position.  He was prepped and draped in the usual sterile fashion.  A preoperative timeout was obtained.  Cystourethroscopy was then performed with both a 12 and 70 lens.  This revealed a normal anterior urethra.  The prostatic urethra demonstrated bilateral lobe hypertrophy and inspection of the bladder revealed the ureteral orifices to be in their expected anatomic location and effluxing clear urine.  Systematic examination of the bladder revealed a small less than 1 cm lesion toward the dome of the bladder.  His prior resection sites were noted.  These did not demonstrate any evidence of clear local recurrence.  No other tumors or other mucosal abnormalities were  identified.  A 30 lens was then utilized to visualize the lesion toward the dome of the bladder.  Using manual pressure in the suprapubic region, the dome of the bladder was brought down to the scope and I was able to transurethrally resect this lesion utilizing the cold cup biopsy forceps.  I then took sites selected biopsies of the right lateral wall and left lateral wall near prior resection sites as indicated.  All areas were then fulgurated with the Bugbee electrode providing excellent hemostasis.  The bladder was then emptied and reinspected and there were no bleeding sites.  The procedure was ended.  The patient tolerated the procedure well and without complications.  He was able to be awakened and transferred to the recovery unit in satisfactory condition.

## 2014-03-08 NOTE — Interval H&P Note (Signed)
History and Physical Interval Note:  03/08/2014 8:43 AM  Mason Scott  has presented today for surgery, with the diagnosis of Bladder Cancer  The various methods of treatment have been discussed with the patient and family. After consideration of risks, benefits and other options for treatment, the patient has consented to  Procedure(s): CYSTOSCOPY WITH BIOPSY (N/A) as a surgical intervention .  The patient's history has been reviewed, patient examined, no change in status, stable for surgery.  I have reviewed the patient's chart and labs.  Questions were answered to the patient's satisfaction.     Anden Bartolo,LES

## 2014-03-08 NOTE — Progress Notes (Signed)
Patient assisted up to void. Tolerated well w/o dizziness

## 2014-03-08 NOTE — Discharge Instructions (Signed)
1. You may see some blood in the urine and may have some burning with urination for 48-72 hours. You also may notice that you have to urinate more frequently or urgently after your procedure which is normal.  °2. You should call should you develop an inability urinate, fever > 101, persistent nausea and vomiting that prevents you from eating or drinking to stay hydrated.  °

## 2014-03-08 NOTE — Anesthesia Postprocedure Evaluation (Signed)
   Anesthesia Post-op Note  Patient: Mason Scott  Procedure(s) Performed: Procedure(s) (LRB): CYSTOSCOPY WITH BIOPSY (N/A)  Patient Location: PACU  Anesthesia Type: General  Level of Consciousness: awake and alert   Airway and Oxygen Therapy: Patient Spontanous Breathing  Post-op Pain: mild  Post-op Assessment: Post-op Vital signs reviewed, Patient's Cardiovascular Status Stable, Respiratory Function Stable, Patent Airway and No signs of Nausea or vomiting  Last Vitals:  Filed Vitals:   03/08/14 1000  BP: 107/76  Pulse: 55  Temp:   Resp: 12    Post-op Vital Signs: stable   Complications: No apparent anesthesia complications

## 2014-03-08 NOTE — Progress Notes (Signed)
CPAP mask and tubing in room in SS

## 2014-03-08 NOTE — Transfer of Care (Signed)
Immediate Anesthesia Transfer of Care Note  Patient: Mason Scott  Procedure(s) Performed: Procedure(s): CYSTOSCOPY WITH BIOPSY (N/A)  Patient Location: PACU  Anesthesia Type:General  Level of Consciousness: awake, alert  and oriented  Airway & Oxygen Therapy: Patient Spontanous Breathing and Patient connected to face mask oxygen  Post-op Assessment: Report given to PACU RN and Post -op Vital signs reviewed and stable  Post vital signs: Reviewed and stable  Complications: No apparent anesthesia complications

## 2014-03-09 ENCOUNTER — Encounter (HOSPITAL_COMMUNITY): Payer: Self-pay | Admitting: Urology

## 2014-10-10 ENCOUNTER — Other Ambulatory Visit (HOSPITAL_COMMUNITY): Payer: Self-pay | Admitting: Urology

## 2014-10-10 DIAGNOSIS — N2889 Other specified disorders of kidney and ureter: Secondary | ICD-10-CM

## 2014-10-10 DIAGNOSIS — E079 Disorder of thyroid, unspecified: Secondary | ICD-10-CM

## 2014-10-16 ENCOUNTER — Other Ambulatory Visit: Payer: Self-pay | Admitting: Urology

## 2014-10-25 ENCOUNTER — Ambulatory Visit (HOSPITAL_COMMUNITY)
Admission: RE | Admit: 2014-10-25 | Discharge: 2014-10-25 | Disposition: A | Discharge status: Home / Self Care | Payer: Medicare Other | Source: Ambulatory Visit | Attending: Urology | Admitting: Urology

## 2014-10-25 DIAGNOSIS — N2889 Other specified disorders of kidney and ureter: Secondary | ICD-10-CM | POA: Diagnosis present

## 2014-10-25 DIAGNOSIS — E079 Disorder of thyroid, unspecified: Secondary | ICD-10-CM

## 2014-10-25 MED ORDER — GADOBENATE DIMEGLUMINE 529 MG/ML IV SOLN
20.0000 mL | Freq: Once | INTRAVENOUS | Status: AC | PRN
  Administered 2014-10-25: 20 mL via INTRAVENOUS

## 2014-10-31 ENCOUNTER — Other Ambulatory Visit (HOSPITAL_COMMUNITY): Payer: Self-pay | Admitting: *Deleted

## 2014-10-31 NOTE — Patient Instructions (Addendum)
Mason Scott  10/31/2014   Your procedure is scheduled on: Monday 11-12-14  Report to [facility] Main  Entrance and follow signs to               [place] at 215 PM.  Call this number if you have problems the morning of surgery [phone]   Remember: BRING CPAP [place] not eat food :After Midnight, clear liquids midnight until 1015 am Monday morning, nothing by mouth after 1015 am Monday morning.     Take these medicines the morning of surgery with A SIP OF WATER: no meds to take                               You may not have any metal on your body including hair pins and              piercings  Do not wear jewelry, make-up, lotions, powders or perfumes.             Do not wear nail polish.  Do not shave  48 hours prior to surgery.              Men may shave face and neck.   Do not bring valuables to the hospital. [place].  Contacts, dentures or bridgework may not be worn into surgery.  Leave suitcase in the car. After surgery it may be brought to your room.     Patients discharged the day of surgery will not be allowed to drive home.  Name and phone number of your driver:  Special Instructions: N/A              Please read over the following fact sheets you were given: _____________________________________________________________________             [facility] - Preparing for Surgery Before surgery, you can play an important role.  Because skin is not sterile, your skin needs to be as free of germs as possible.  You can reduce the number of germs on your skin by washing with CHG (chlorahexidine gluconate) soap before surgery.  CHG is an antiseptic cleaner which kills germs and bonds with the skin to continue killing germs even after washing. Please DO NOT use if you have an allergy to CHG or antibacterial soaps.  If your skin becomes reddened/irritated stop using the CHG and  inform your nurse when you arrive at Short Stay. Do not shave (including legs and underarms) for at least 48 hours prior to the first CHG shower.  You may shave your face/neck. Please follow these instructions carefully:  1.  Shower with CHG Soap the night before surgery and the  morning of Surgery.  2.  If you choose to wash your hair, wash your hair first as usual with your  normal  shampoo.  3.  After you shampoo, rinse your hair and body thoroughly to remove the  shampoo.                           4.  Use CHG as you would any other liquid soap.  You can apply chg directly  to the skin and wash  Gently with a scrungie or clean washcloth.  5.  Apply the CHG Soap to your body ONLY FROM THE NECK DOWN.   Do not use on face/ open                           Wound or open sores. Avoid contact with eyes, ears mouth and genitals (private parts).                       Wash face,  Genitals (private parts) with your normal soap.             6.  Wash thoroughly, paying special attention to the area where your surgery  will be performed.  7.  Thoroughly rinse your body with warm water from the neck down.  8.  DO NOT shower/wash with your normal soap after using and rinsing off  the CHG Soap.                9.  Pat yourself dry with a clean towel.            10.  Wear clean pajamas.            11.  Place clean sheets on your bed the night of your first shower and do not  sleep with pets. Day of Surgery : Do not apply any lotions/deodorants the morning of surgery.  Please wear clean clothes to the hospital/surgery center.  FAILURE TO FOLLOW THESE INSTRUCTIONS MAY RESULT IN THE CANCELLATION OF YOUR SURGERY PATIENT SIGNATURE_________________________________  NURSE SIGNATURE__________________________________  ________________________________________________________________________

## 2014-11-01 ENCOUNTER — Encounter (HOSPITAL_COMMUNITY): Payer: Self-pay

## 2014-11-01 ENCOUNTER — Encounter (HOSPITAL_COMMUNITY)
Admission: RE | Admit: 2014-11-01 | Discharge: 2014-11-01 | Disposition: A | Discharge status: Home / Self Care | Payer: Medicare Other | Source: Ambulatory Visit | Attending: Urology | Admitting: Urology

## 2014-11-01 ENCOUNTER — Ambulatory Visit (HOSPITAL_COMMUNITY)
Admission: RE | Admit: 2014-11-01 | Discharge: 2014-11-01 | Disposition: A | Discharge status: Home / Self Care | Payer: Medicare Other | Source: Ambulatory Visit | Attending: Urology | Admitting: Urology

## 2014-11-01 DIAGNOSIS — Z01818 Encounter for other preprocedural examination: Secondary | ICD-10-CM | POA: Diagnosis present

## 2014-11-01 DIAGNOSIS — D494 Neoplasm of unspecified behavior of bladder: Secondary | ICD-10-CM | POA: Insufficient documentation

## 2014-11-01 DIAGNOSIS — I10 Essential (primary) hypertension: Secondary | ICD-10-CM | POA: Insufficient documentation

## 2014-11-01 LAB — CBC
HCT: 41 % (ref 39.0–52.0)
HEMOGLOBIN: 14 g/dL (ref 13.0–17.0)
MCH: 31.2 pg (ref 26.0–34.0)
MCHC: 34.1 g/dL (ref 30.0–36.0)
MCV: 91.3 fL (ref 78.0–100.0)
Platelets: 204 10*3/uL (ref 150–400)
RBC: 4.49 MIL/uL (ref 4.22–5.81)
RDW: 12.4 % (ref 11.5–15.5)
WBC: 6 10*3/uL (ref 4.0–10.5)

## 2014-11-01 LAB — BASIC METABOLIC PANEL
Anion gap: 7 (ref 5–15)
BUN: 16 mg/dL (ref 6–23)
CHLORIDE: 103 mmol/L (ref 96–112)
CO2: 27 mmol/L (ref 19–32)
Calcium: 9 mg/dL (ref 8.4–10.5)
Creatinine, Ser: 1.1 mg/dL (ref 0.50–1.35)
GFR calc Af Amer: 78 mL/min — ABNORMAL LOW (ref >90)
GFR calc non Af Amer: 68 mL/min — ABNORMAL LOW (ref >90)
GLUCOSE: 109 mg/dL — AB (ref 70–99)
Potassium: 4.1 mmol/L (ref 3.5–5.1)
SODIUM: 137 mmol/L (ref 135–145)

## 2014-11-01 NOTE — Progress Notes (Signed)
Chest xray results faxed to dr borden by epic

## 2014-11-09 NOTE — Progress Notes (Signed)
Patient notified of time change for surgery on 11/12/2014.Patient instructed to have nothing to eat or drink after midnight night before surgery , arrive at Short Stay at [facility] on 11/12/2014 at 0645 am . Patient verbalized understanding.

## 2014-11-10 NOTE — H&P (Signed)
History of Present Illness [name] is a 69 year old with the following urologic history:    1) Prostate cancer: He was diagnosed with cT1cNx Mx, Gleason 3+3=6 adenocarcinoma of the prostate (pretreatment PSA 6.14) in November 2013 and is s/p treatment with brachytherapy (Dr. Arloa Koh) on 09/29/12.  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 He was noted to have a recurrent, high grade Ta urothelial carcinoma in 2015.    Sep 1991: TURBT, Thiotepa  Mar 2015: Erythematous changes and atypical cytology with positive FISH  Apr 2015: TURBT: High grade, Ta urothelial carcinoma of left lateral bladder  Jun-Jul 2015: Induction BCG  Sep 2015: TURBT: Suspicious cytology, Low grade Ta tumor of dome of the bladder  Sep-Oct 2015: 3 week maintenance BCG (rash developed ? related to BCG)  Jan 2016: Cytology suspicious, Upper tract imaging (CT negative), cysto negative  Feb 2016: 3 week maintenance BCG    3) Incontinence: He presented with new onset incontinence in July 2014 associated with urgency. This resolved spontaneously without the need for treatment.    Interval history:  Recent urine cytology was positive.    Past Medical History Problems  1. History of Arthritis 2. History of hyperlipidemia (Z86.39) 3. History of hypertension (Z86.79) 4. History of sleep apnea (Z87.09)  Surgical History Problems  1. History of Cystoscopy Bladder Tumor 2. History of Cystoscopy With Biopsy 3. History of Cystoscopy With Fulguration Medium Lesion (2-5cm) 4. History of Surgery Prostate Transperineal Placement Of Needles  Current Meds 1. AmLODIPine Besylate 10 MG Oral Tablet;  Therapy: (GNOIBBCW:[id number]) to Recorded 2. Aspir-81 81 MG Oral Tablet Delayed Release;  Therapy: (WTUUEKCM:03KJZ7915) to Recorded 3. CPAP;  Therapy:  (AVWPVXYI:01KPV3748) to Recorded 4. Fish Oil Concentrate 1000 MG Oral Capsule;  Therapy: (OLMBEMLJ:44BEE1007) to Recorded 5. Meloxicam 15 MG Oral Tablet; takes 1/2 qd;  Therapy: 21Jan2014 to Recorded 6. Valsartan-Hydrochlorothiazide 160-25 MG Oral Tablet;  Therapy: (Recorded:06May2015) to Recorded 7. VESIcare 5 MG Oral Tablet; TAKE 1 TABLET As Directed;  Therapy: [id number] to (Evaluate:28May2016)  Requested for: [id number]; Last  Rx:03Jun2015 Ordered  Allergies Medication  1. No Known Drug Allergies  Family History Problems  1. Family history of Breast Cancer : Mother 2. Denied: Family history of Prostate Cancer 3. Family history of Stroke Syndrome : Father  Social History Problems  1. Denied: History of Alcohol Use 2. Marital History - Currently Married 3. Never A Smoker  Vitals Vital Signs [Data Includes: Last 1 Day]  Recorded: 06Apr2016 10:08AM  Blood Pressure: 153 / 83 Heart Rate: 61  Physical Exam Constitutional: Well nourished and well developed . No acute distress.  ENT:. The ears and nose are normal in appearance.  Neck: The appearance of the neck is normal and no neck mass is present.  Pulmonary: No respiratory distress and normal respiratory rhythm and effort.  Cardiovascular: Heart rate and rhythm are normal . No peripheral edema.  Abdomen: The abdomen is soft and nontender. No masses are palpated. No CVA tenderness. No hernias are palpable. No hepatosplenomegaly noted.  Genitourinary: Examination of the penis demonstrates no lesions and a normal meatus.  Lymphatics: The supraclavicular, femoral and inguinal nodes are not enlarged or tender.  Skin: Normal skin turgor, no visible rash and no visible skin lesions.  Neuro/Psych:. Mood and affect are appropriate.    Results/Data Urine [Data Includes: Last 1 Day]  [phone]  COLOR YELLOW   APPEARANCE CLEAR   SPECIFIC GRAVITY 1.015   pH 5.0   GLUCOSE NEG mg/dL  BILIRUBIN NEG   KETONE NEG mg/dL  BLOOD SMALL    PROTEIN NEG mg/dL  UROBILINOGEN 0.2 mg/dL  NITRITE NEG   LEUKOCYTE ESTERASE NEG   SQUAMOUS EPITHELIAL/HPF NONE SEEN   WBC NONE SEEN WBC/hpf  RBC 0-2 RBC/hpf  BACTERIA NONE SEEN   CRYSTALS NONE SEEN   CASTS NONE SEEN   Selected Results  CREATININE with eGFR [id number] 01:02PM Raynelle Bring  SPECIMEN TYPE: BLOOD   Test Name Result Flag Reference  CREATININE 1.10 mg/dL  0.50-1.50  Est GFR, African American 80 mL/min    PERFORMED AT:        [facility]                      [place]                      [place], [place] [address]  Est GFR, NonAfrican American 69 mL/min    THE ESTIMATED GFR IS A CALCULATION VALID FOR ADULTS (>=47 YEARS OLD) THAT USES THE CKD-EPI ALGORITHM TO ADJUST FOR AGE AND SEX. IT IS   NOT TO BE USED FOR CHILDREN, PREGNANT WOMEN, HOSPITALIZED PATIENTS,    PATIENTS ON DIALYSIS, OR WITH RAPIDLY CHANGING KIDNEY FUNCTION. ACCORDING TO THE NKDEP, EGFR >89 IS NORMAL, 60-89 SHOWS MILD IMPAIRMENT, 30-59 SHOWS MODERATE IMPAIRMENT, 15-29 SHOWS SEVERE IMPAIRMENT AND <15 IS ESRD.   CT-CHEST WITH CONTRAST [id number] 12:00AM Raynelle Bring   Test Name Result Flag Reference  CT-CHEST WITH CONTRAST (Report)    ** RADIOLOGY REPORT BY [facility] **   CLINICAL DATA: Left-sided renal mass. Bladder cancer, status post resection in 1981. Repeat April 2015. Prostate cancer with radiation seed implant in 2013. Lung nodule.  EXAM: CT CHEST WITH CONTRAST  CT ABDOMEN WITHOUT AND WITH CONTRAST  TECHNIQUE: Multidetector CT imaging of the abdomen was performed without intravenous contrast. Multidetector CT imaging of the chest and abdomen was then performed during bolus administration of intravenous contrast.  CONTRAST: 125 cc Isovue-300  COMPARISON: 07/25/2014 abdominal pelvic CT. No prior chest CT. Chest radiograph 10/16/2013 reviewed.  FINDINGS: CT CHEST FINDINGS  Mediastinum/Nodes: No supraclavicular adenopathy. A right-sided thyroid  nodule measures 1.8 cm on image 6 of series 4. Normal heart size, without pericardial effusion. Mild lipomatous hypertrophy of the interatrial septum. No central pulmonary embolism, on this non-dedicated study. Coronary artery atherosclerosis, including within the left main and left circumflex on image 31. No mediastinal or hilar adenopathy.  Lungs/Pleura: No pleural fluid. Right lower lobe pulmonary nodule measures 5 mm on image 41. 4 mm on the prior exam.  Calcified granuloma in the lingula.  Minimal left lower lobe pleural-based irregularity on image 42 favored to represent an area of scarring and is unchanged.  Musculoskeletal: No acute osseous abnormality.  CT ABDOMEN FINDINGS  Hepatobiliary: Well-circumscribed low-density liver lesions are similar and likely cysts. Some are too small to characterize. Possible gallstone or stones with calcification on image 80. No surrounding inflammation or biliary ductal dilatation.  Pancreas: Normal, without mass or ductal dilatation.  Spleen: Normal  Adrenals/Urinary Tract: Normal adrenal glands. No renal calculi or hydronephrosis. No abdominal hydroureter or ureteric stone.  Posterior inter/ lower pole right renal lesion measures 1.8 cm. 19 HU prior to contrast up to 50 HU after contrast. Image 112 series 4. Increased density less impressive on coronal  reformats. Twenty-four HU prior to contrast and 40 HU after contrast.  A medial interpolar left renal lesion measures 1.9 cm on image 90 of series 4. Forty-two HU prior to contrast and 54 HU on corticomedullary phase imaging. 68 HU on nephrographic phase image 58 of series 7.  Other bilateral renal lesions which are consistent with cysts and too small to characterize foci. Bilateral renal sinus cysts. No abdominal ureteric or renal collecting system filling defect on delayed images.  Stomach/Bowel: Normal stomach, without wall thickening. Scattered colonic diverticula. Normal  abdominal portions of the terminal ileum and appendix. Normal abdominal small bowel.  Vascular/Lymphatic: Normal caliber of the aorta and branch vessels. Accessory lower pole left renal artery. Patent renal veins. Aortic atherosclerosis. No retroperitoneal or retrocrural adenopathy.  Other: No ascites.  Musculoskeletal: Probable sebaceous cyst about the left flank. 1.8 cm, similar. mildly heterogeneous marrow density, favored to be due to small hemangiomas.  IMPRESSION: 1. Bilateral renal lesions which demonstrate apparent increased density after contrast. Favored to represent enhancing solid renal cell carcinomas. As this apparent increased (especially in the right-sided lesion) density could be secondary to "Pseudo enhancement" a CT reconstruction artifact, preoperative pre and post contrast abdominal MRI should be considered. 2. No evidence of metastatic disease within the abdomen. 3. An isolated right lower lobe pulmonary nodule is similar to minimally enlarged since the prior exam. This warrants followup attention to exclude a synchronous primary bronchogenic carcinoma versus less likely isolated pulmonary metastasis. 4. Indeterminate right-sided thyroid nodule. Per consensus criteria, this should be considered for thyroid ultrasound. This follows ACR consensus guidelines: Managing Incidental Thyroid Nodules Detected on Imaging: White Paper of the ACR Incidental Thyroid Findings Committee. J Am Coll Radiol 2015; 12:143-150. 5. Atherosclerosis, including within the coronary arteries.   Electronically Signed  By: Abigail Miyamoto M.D.  On: 10/04/2014 15:07    I independently reviewed his CT scan. Findings are as dictated above.  Procedure  Procedure: Cystoscopy  Chaperone Present: Nira Conn S.  Indication: History of Urothelial Carcinoma.  Informed Consent: Risks, benefits, and potential adverse events were discussed and informed consent was obtained from the patient.  Prep:  The patient was prepped with betadine.  Anesthesia:. Local anesthesia was administered intraurethrally with 2% lidocaine jelly.  Antibiotic prophylaxis: Ciprofloxacin.  Procedure Note:  Urethral meatus:. No abnormalities.  Anterior urethra: No abnormalities.  Prostatic urethra:. The lateral prostatic lobes were enlarged.  Bladder: Visulization was clear. The ureteral orifices were in the normal anatomic position bilaterally and had clear efflux of urine. Systematic examination of the bladder revealed an erythematous area toward the left posterior bladder and a smaller erythematous area toward the right posterior bladder. No actual bladder tumors were noted. The ureteral orifices were in their expected anatomic location. A saline bladder washing was obtained and sent for cytologic analysis. The patient tolerated the procedure well.  Complications: None.    Assessment Assessed  1. Lung mass (R91.8) 2. Malignant neoplasm of overlapping sites of bladder (C67.8) 3. Renal mass (N28.89) 4. Thyroid mass (E07.9)   Discussion/Summary 1. Bladder cancer: The cystoscopic findings are not strongly suspicious for recurrent malignancy but do raise that possibility. I will await the results of his bladder washing was sent for cytology today. If suspicious for malignancy, he likely will require cystoscopy and biopsy of the areas noted on cystoscopy today. Otherwise, he will plan to follow up in 3 months for surveillance cystoscopy.        1 Amended By: Raynelle Bring; Oct 10 2014 5:53 PM EST  Verified Results URINE CYTOLOGY W/REFLEX FISH1 06Apr2016 11:23AM1 [employer]  SPECIMEN TYPE: OTHER  [Oct 13, 2014 9:18PM Alinda Money, Glorine Hanratty] Mr. Falls was informed that his cytology was positive. I recommended proceeding with cystoscopy and biopsies of the abnormal areas noted on cystoscopy. He is agreeable after discussing the potential risks, complications, and expected recovery procees. He gives informed  consent to proceed.   Test Name Result Flag Reference  FINAL DIAGNOSIS:1  A1   - POSITIVE FOR MALIGNANCY. - UROTHELIAL CARCINOMA. THERE HAS BEEN AN INTRADEPARTMENTAL REVIEW AND THE CONSULTED PATHOLOGIST CONCURS WITH THE FINAL DIAGNOSIS.  NUMBER OF SLIDES1     1 Container Submitted  SOURCE:1 Bladder Washing1    50CC IN CUP1, 50CC IN CUP2 OF LIGHT YELLOW BLW RECEIVED IN FIXATIVE 1 SLIDE PREPARED [id number] LW  Relevant Clinical Info1     MALIGNANT NEOPLASM OF OVERLAPPING SITES OF BLADDER  CYTOTECHNOLOGIST:1     KS, CT(HEW)  PATHOLOGIST:1     REVIEWED BY S. SERDAR DEMIRCI, MD, (ELECTRONIC SIGNATURE ON FILE)     1. Amended By: Raynelle Bring; Oct 13 2014 9:18 PM EST  Signatures Electronically signed by : Raynelle Bring, M.D.; Oct 13 2014  9:19PM EST

## 2014-11-12 ENCOUNTER — Ambulatory Visit (HOSPITAL_COMMUNITY)
Admission: RE | Admit: 2014-11-12 | Discharge: 2014-11-12 | Disposition: A | Discharge status: Home / Self Care | Payer: Medicare Other | Source: Ambulatory Visit | Attending: Urology | Admitting: Urology

## 2014-11-12 ENCOUNTER — Encounter (HOSPITAL_COMMUNITY)
Admission: RE | Disposition: A | Discharge status: Home / Self Care | Payer: Self-pay | Source: Ambulatory Visit | Attending: Urology

## 2014-11-12 ENCOUNTER — Ambulatory Visit (HOSPITAL_COMMUNITY): Payer: Medicare Other | Admitting: Anesthesiology

## 2014-11-12 ENCOUNTER — Encounter (HOSPITAL_COMMUNITY): Payer: Self-pay | Admitting: *Deleted

## 2014-11-12 DIAGNOSIS — R918 Other nonspecific abnormal finding of lung field: Secondary | ICD-10-CM | POA: Insufficient documentation

## 2014-11-12 DIAGNOSIS — C672 Malignant neoplasm of lateral wall of bladder: Secondary | ICD-10-CM | POA: Diagnosis not present

## 2014-11-12 DIAGNOSIS — Z7982 Long term (current) use of aspirin: Secondary | ICD-10-CM | POA: Insufficient documentation

## 2014-11-12 DIAGNOSIS — D494 Neoplasm of unspecified behavior of bladder: Secondary | ICD-10-CM | POA: Diagnosis present

## 2014-11-12 DIAGNOSIS — C674 Malignant neoplasm of posterior wall of bladder: Secondary | ICD-10-CM | POA: Diagnosis not present

## 2014-11-12 DIAGNOSIS — M1990 Unspecified osteoarthritis, unspecified site: Secondary | ICD-10-CM | POA: Insufficient documentation

## 2014-11-12 DIAGNOSIS — N2889 Other specified disorders of kidney and ureter: Secondary | ICD-10-CM | POA: Insufficient documentation

## 2014-11-12 DIAGNOSIS — G4730 Sleep apnea, unspecified: Secondary | ICD-10-CM | POA: Insufficient documentation

## 2014-11-12 DIAGNOSIS — Z8546 Personal history of malignant neoplasm of prostate: Secondary | ICD-10-CM | POA: Diagnosis not present

## 2014-11-12 DIAGNOSIS — I10 Essential (primary) hypertension: Secondary | ICD-10-CM | POA: Insufficient documentation

## 2014-11-12 DIAGNOSIS — E079 Disorder of thyroid, unspecified: Secondary | ICD-10-CM | POA: Diagnosis not present

## 2014-11-12 DIAGNOSIS — Z79899 Other long term (current) drug therapy: Secondary | ICD-10-CM | POA: Insufficient documentation

## 2014-11-12 DIAGNOSIS — E785 Hyperlipidemia, unspecified: Secondary | ICD-10-CM | POA: Insufficient documentation

## 2014-11-12 DIAGNOSIS — Z791 Long term (current) use of non-steroidal anti-inflammatories (NSAID): Secondary | ICD-10-CM | POA: Insufficient documentation

## 2014-11-12 HISTORY — PX: CYSTOSCOPY WITH BIOPSY: SHX5122

## 2014-11-12 SURGERY — CYSTOSCOPY, WITH BIOPSY
Anesthesia: General

## 2014-11-12 MED ORDER — CIPROFLOXACIN IN D5W 400 MG/200ML IV SOLN
INTRAVENOUS | Status: AC
  Filled 2014-11-12: qty 200

## 2014-11-12 MED ORDER — ONDANSETRON HCL 4 MG/2ML IJ SOLN
INTRAMUSCULAR | Status: AC
  Filled 2014-11-12: qty 2

## 2014-11-12 MED ORDER — DEXAMETHASONE SODIUM PHOSPHATE 10 MG/ML IJ SOLN
INTRAMUSCULAR | Status: AC
  Filled 2014-11-12: qty 1

## 2014-11-12 MED ORDER — LIDOCAINE HCL (CARDIAC) 20 MG/ML IV SOLN
INTRAVENOUS | Status: AC
  Filled 2014-11-12: qty 5

## 2014-11-12 MED ORDER — CIPROFLOXACIN IN D5W 400 MG/200ML IV SOLN
400.0000 mg | INTRAVENOUS | Status: AC
  Administered 2014-11-12: 400 mg via INTRAVENOUS

## 2014-11-12 MED ORDER — PROPOFOL 10 MG/ML IV BOLUS
INTRAVENOUS | Status: DC | PRN
  Administered 2014-11-12: 50 mg via INTRAVENOUS
  Administered 2014-11-12: 200 mg via INTRAVENOUS

## 2014-11-12 MED ORDER — FENTANYL CITRATE (PF) 100 MCG/2ML IJ SOLN
INTRAMUSCULAR | Status: DC | PRN
  Administered 2014-11-12 (×2): 50 ug via INTRAVENOUS

## 2014-11-12 MED ORDER — MIDAZOLAM HCL 2 MG/2ML IJ SOLN
INTRAMUSCULAR | Status: AC
  Filled 2014-11-12: qty 2

## 2014-11-12 MED ORDER — DEXAMETHASONE SODIUM PHOSPHATE 10 MG/ML IJ SOLN
INTRAMUSCULAR | Status: DC | PRN
  Administered 2014-11-12: 10 mg via INTRAVENOUS

## 2014-11-12 MED ORDER — PHENAZOPYRIDINE HCL 100 MG PO TABS: 100.0000 mg | ORAL_TABLET | Freq: Three times a day (TID) | ORAL | Status: DC | PRN

## 2014-11-12 MED ORDER — STERILE WATER FOR IRRIGATION IR SOLN
Status: DC | PRN
  Administered 2014-11-12: 3000 mL

## 2014-11-12 MED ORDER — LACTATED RINGERS IV SOLN
INTRAVENOUS | Status: DC | PRN
  Administered 2014-11-12: 08:00:00 via INTRAVENOUS

## 2014-11-12 MED ORDER — ONDANSETRON HCL 4 MG/2ML IJ SOLN
INTRAMUSCULAR | Status: DC | PRN
  Administered 2014-11-12: 4 mg via INTRAVENOUS

## 2014-11-12 MED ORDER — FENTANYL CITRATE (PF) 100 MCG/2ML IJ SOLN
INTRAMUSCULAR | Status: AC
Start: 2014-11-12 — End: 2014-11-12
  Filled 2014-11-12: qty 2

## 2014-11-12 MED ORDER — LIDOCAINE HCL (CARDIAC) 20 MG/ML IV SOLN
INTRAVENOUS | Status: DC | PRN
  Administered 2014-11-12: 30 mg via INTRAVENOUS

## 2014-11-12 MED ORDER — HYDROCODONE-ACETAMINOPHEN 5-325 MG PO TABS: 1.0000 | ORAL_TABLET | Freq: Four times a day (QID) | ORAL | Status: DC | PRN

## 2014-11-12 MED ORDER — PROPOFOL 10 MG/ML IV BOLUS
INTRAVENOUS | Status: AC
  Filled 2014-11-12: qty 20

## 2014-11-12 MED ORDER — MIDAZOLAM HCL 5 MG/5ML IJ SOLN
INTRAMUSCULAR | Status: DC | PRN
  Administered 2014-11-12 (×2): 1 mg via INTRAVENOUS

## 2014-11-12 SURGICAL SUPPLY — 14 items
BAG URINE DRAINAGE (UROLOGICAL SUPPLIES) IMPLANT
BAG URO CATCHER STRL LF (DRAPE) ×3 IMPLANT
ELECT REM PT RETURN 9FT ADLT (ELECTROSURGICAL) ×3
ELECTRODE REM PT RTRN 9FT ADLT (ELECTROSURGICAL) ×1 IMPLANT
EVACUATOR MICROVAS BLADDER (UROLOGICAL SUPPLIES) IMPLANT
GLOVE BIOGEL M STRL SZ7.5 (GLOVE) ×3 IMPLANT
GOWN STRL REUS W/TWL LRG LVL3 (GOWN DISPOSABLE) ×3 IMPLANT
KIT ASPIRATION TUBING (SET/KITS/TRAYS/PACK) IMPLANT
LOOP CUT BIPOLAR 24F LRG (ELECTROSURGICAL) IMPLANT
MANIFOLD NEPTUNE II (INSTRUMENTS) ×3 IMPLANT
PACK CYSTO (CUSTOM PROCEDURE TRAY) ×3 IMPLANT
SYRINGE IRR TOOMEY STRL 70CC (SYRINGE) IMPLANT
TUBING CONNECTING 10 (TUBING) ×2 IMPLANT
TUBING CONNECTING 10' (TUBING) ×1

## 2014-11-12 NOTE — Discharge Instructions (Addendum)
1. You may see some blood in the urine and may have some burning with urination for 48-72 hours. You also may notice that you have to urinate more frequently or urgently after your procedure which is normal.  2. You should call should you develop an inability urinate, fever > 101, persistent nausea and vomiting that prevents you from eating or drinking to stay hydrated.    General Anesthesia, Care After Refer to this sheet in the next few weeks. These instructions provide you with information on caring for yourself after your procedure. Your health care provider may also give you more specific instructions. Your treatment has been planned according to current medical practices, but problems sometimes occur. Call your health care provider if you have any problems or questions after your procedure. WHAT TO EXPECT AFTER THE PROCEDURE After the procedure, it is typical to experience:  Sleepiness.  Nausea and vomiting. HOME CARE INSTRUCTIONS  For the first 24 hours after general anesthesia:  Have a responsible person with you.  Do not drive a car. If you are alone, do not take public transportation.  Do not drink alcohol.  Do not take medicine that has not been prescribed by your health care provider.  Do not sign important papers or make important decisions.  You may resume a normal diet and activities as directed by your health care provider.  Change bandages (dressings) as directed.  If you have questions or problems that seem related to general anesthesia, call the hospital and ask for the anesthetist or anesthesiologist on call. SEEK MEDICAL CARE IF:  You have nausea and vomiting that continue the day after anesthesia.  You develop a rash. SEEK IMMEDIATE MEDICAL CARE IF:   You have difficulty breathing.  You have chest pain.  You have any allergic problems. Document Released: 09/28/2000 Document Revised: 06/27/2013 Document Reviewed: 01/05/2013 [facility] Patient  Information 2015 [place], [place]. This information is not intended to replace advice given to you by your health care provider. Make sure you discuss any questions you have with your health care provider.

## 2014-11-12 NOTE — Transfer of Care (Signed)
Immediate Anesthesia Transfer of Care Note  Patient: Mason Scott  Procedure(s) Performed: Procedure(s): CYSTOSCOPY WITH BLADDER BIOPSIES (N/A)  Patient Location: PACU  Anesthesia Type:General  Level of Consciousness:  sedated, patient cooperative and responds to stimulation  Airway & Oxygen Therapy:Patient Spontanous Breathing and Patient connected to face mask oxgen  Post-op Assessment:  Report given to PACU RN and Post -op Vital signs reviewed and stable  Post vital signs:  Reviewed and stable  Last Vitals:  Filed Vitals:   11/12/14 0647  BP: 117/71  Pulse: 58  Temp: 37.3 C  Resp: 18    Complications: No apparent anesthesia complications

## 2014-11-12 NOTE — Op Note (Signed)
Preoperative diagnosis: 1. Urothelial carcinoma of the bladder  Postoperative diagnosis:  1. Urothelial carcinoma of the bladder  Procedure:  1. Cystoscopy 2. Bladder biopsies (Left posterior and right lateral)  Surgeon: Pryor Curia. M.D.  Anesthesia: General  Complications: None  Intraoperative findings: There were erythematous areas at the left posterior and right lateral bladder wall that were suspicious for possible malignancy.  EBL: Minimal  Specimens: 1. Left posterior bladder lesion 2. Right lateral bladder lesion  Disposition of specimens: Pathology  Indication: Mason Scott is a 69 year old patient with a history of urothelial carcinoma who was found to have a bladder tumor. After reviewing the management options for treatment, he elected to proceed with the above surgical procedure(s). We have discussed the potential benefits and risks of the procedure, side effects of the proposed treatment, the likelihood of the patient achieving the goals of the procedure, and any potential problems that might occur during the procedure or recuperation. Informed consent has been obtained.  Description of procedure:  The patient was taken to the operating room and general anesthesia was induced.  The patient was placed in the dorsal lithotomy position, prepped and draped in the usual sterile fashion, and preoperative antibiotics were administered. A preoperative time-out was performed.   Cystourethroscopy was performed.  The patient's urethra was examined and was normal.  There were no prostatic urethral abnormalities.   The bladder was then systematically examined in its entirety. The ureteral orifices were in their expected anatomic location.  No bladder tumors were identified.  There was a suspicious area of flat erythematous change over the left posterior bladder wall measuring about 2 cm.  Another smaller area but similar in appearance was noted over the right lateral  bladder wall. No other abnormalities were appreciated.  Representative biopsies of the abnormal areas were then taken with the Tauber biopsy forceps.  Hemostasis was achieved with fulguration via the Tauber forceps.  The bladder was emptied and reinspected with no further bleeding noted at the end of the procedure.    The bladder was then emptied and the procedure ended.  The patient appeared to tolerate the procedure well and without complications.  The patient was able to be awakened and transferred to the recovery unit in satisfactory condition.    Pryor Curia MD

## 2014-11-12 NOTE — Progress Notes (Signed)
IVF intake in short stay 150cc of LR.

## 2014-11-12 NOTE — Anesthesia Preprocedure Evaluation (Signed)
Anesthesia Evaluation  Patient identified by MRN, date of birth, ID band Patient awake    Reviewed: Allergy & Precautions, H&P , NPO status , Patient's Chart, lab work & pertinent test results  History of Anesthesia Complications Negative for: history of anesthetic complications  Airway Mallampati: II  TM Distance: >3 FB Neck ROM: Full    Dental  (+) Chipped,    Pulmonary sleep apnea and Continuous Positive Airway Pressure Ventilation ,  breath sounds clear to auscultation        Cardiovascular Exercise Tolerance: Good hypertension, Pt. on medications Rhythm:Regular Rate:Normal  HLD   Neuro/Psych negative neurological ROS  negative psych ROS   GI/Hepatic negative GI ROS, Neg liver ROS,   Endo/Other  negative endocrine ROS  Renal/GU negative Renal ROS   Bladder ca, prostate ca    Musculoskeletal  (+) Arthritis -,   Abdominal Normal abdominal exam  (+)  Abdomen: soft.    Peds negative pediatric ROS (+)  Hematology negative hematology ROS (+)   Anesthesia Other Findings   Reproductive/Obstetrics negative OB ROS                             Anesthesia Physical  Anesthesia Plan  ASA: II  Anesthesia Plan: General   Post-op Pain Management:    Induction: Intravenous  Airway Management Planned: LMA  Additional Equipment:   Intra-op Plan:   Post-operative Plan: Extubation in OR  Informed Consent: I have reviewed the patients History and Physical, chart, labs and discussed the procedure including the risks, benefits and alternatives for the proposed anesthesia with the patient or authorized representative who has indicated his/her understanding and acceptance.   Dental advisory given  Plan Discussed with: CRNA  Anesthesia Plan Comments:         Anesthesia Quick Evaluation

## 2014-11-12 NOTE — Anesthesia Postprocedure Evaluation (Signed)
Anesthesia Post Note  Patient: Mason Scott  Procedure(s) Performed: Procedure(s) (LRB): CYSTOSCOPY WITH BLADDER BIOPSIES (N/A)  Anesthesia type: General  Patient location: PACU  Post pain: Pain level controlled  Post assessment: Post-op Vital signs reviewed  Last Vitals: BP 138/80 mmHg  Pulse 64  Temp(Src) 36.6 C (Oral)  Resp 16  Ht 5\' 10"  (1.778 m)  Wt 217 lb (98.431 kg)  BMI 31.14 kg/m2  SpO2 100%  Post vital signs: Reviewed  Level of consciousness: sedated  Complications: No apparent anesthesia complications

## 2014-11-13 ENCOUNTER — Encounter (HOSPITAL_COMMUNITY): Payer: Self-pay | Admitting: Urology

## 2014-11-21 ENCOUNTER — Other Ambulatory Visit (INDEPENDENT_AMBULATORY_CARE_PROVIDER_SITE_OTHER): Payer: Self-pay | Admitting: Otolaryngology

## 2014-11-21 DIAGNOSIS — R229 Localized swelling, mass and lump, unspecified: Principal | ICD-10-CM

## 2014-11-21 DIAGNOSIS — IMO0002 Reserved for concepts with insufficient information to code with codable children: Secondary | ICD-10-CM

## 2014-11-23 ENCOUNTER — Other Ambulatory Visit: Payer: Self-pay | Admitting: Urology

## 2014-11-28 ENCOUNTER — Other Ambulatory Visit (HOSPITAL_COMMUNITY): Payer: Medicare Other

## 2014-12-13 ENCOUNTER — Ambulatory Visit (HOSPITAL_COMMUNITY)
Admission: RE | Admit: 2014-12-13 | Discharge: 2014-12-13 | Disposition: A | Discharge status: Home / Self Care | Payer: Medicare Other | Source: Ambulatory Visit | Attending: Otolaryngology | Admitting: Otolaryngology

## 2014-12-13 ENCOUNTER — Encounter (HOSPITAL_COMMUNITY): Payer: Self-pay

## 2014-12-13 DIAGNOSIS — E041 Nontoxic single thyroid nodule: Secondary | ICD-10-CM | POA: Insufficient documentation

## 2014-12-13 DIAGNOSIS — R229 Localized swelling, mass and lump, unspecified: Secondary | ICD-10-CM

## 2014-12-13 DIAGNOSIS — IMO0002 Reserved for concepts with insufficient information to code with codable children: Secondary | ICD-10-CM

## 2014-12-13 MED ORDER — LIDOCAINE HCL (PF) 2 % IJ SOLN
INTRAMUSCULAR | Status: AC
  Administered 2014-12-13: 10 mL
  Filled 2014-12-13: qty 10

## 2014-12-13 NOTE — Discharge Instructions (Signed)
Thyroid Biopsy °The thyroid gland is a butterfly-shaped gland situated in the front of the neck. It produces hormones which affect metabolism, growth and development, and body temperature. A thyroid biopsy is a procedure in which small samples of tissue or fluid are removed from the thyroid gland or mass and examined under a microscope. This test is done to determine the cause of thyroid problems, such as infection, cancer, or other thyroid problems. °There are 2 ways to obtain samples: °1. Fine needle biopsy. Samples are removed using a thin needle inserted through the skin and into the thyroid gland or mass. °2. Open biopsy. Samples are removed after a cut (incision) is made through the skin. °LET YOUR CAREGIVER KNOW ABOUT:  °· Allergies. °· Medications taken including herbs, eye drops, over-the-counter medications, and creams. °· Use of steroids (by mouth or creams). °· Previous problems with anesthetics or numbing medicine. °· Possibility of pregnancy, if this applies. °· History of blood clots (thrombophlebitis). °· History of bleeding or blood problems. °· Previous surgery. °· Other health problems. °RISKS AND COMPLICATIONS °· Bleeding from the site. The risk of bleeding is higher if you have a bleeding disorder or are taking any blood thinning medications (anticoagulants). °· Infection. °· Injury to structures near the thyroid gland. °BEFORE THE PROCEDURE  °This is a procedure that can be done as an outpatient. Confirm the time that you need to arrive for your procedure. Confirm whether there is a need to fast or withhold any medications. A blood sample may be done to determine your blood clotting time. Medicine may be given to help you relax (sedative). °PROCEDURE °Fine needle biopsy. °You will be awake during the procedure. You may be asked to lie on your back with your head tipped backward to extend your neck. Let your caregiver know if you cannot tolerate the positioning. An area on your neck will be  cleansed. A needle is inserted through the skin of your neck. You may feel a mild discomfort during this procedure. You may be asked to avoid coughing, talking, swallowing, or making sounds during some portions of the procedure. The needle is withdrawn once tissue or fluid samples have been removed. Pressure may be applied to the neck to reduce swelling and ensure that bleeding has stopped. The samples will be sent for examination.  °Open biopsy. °You will be given general anesthesia. You will be asleep during the procedure. An incision is made in your neck. A sample of thyroid tissue or the mass is removed. The tissue sample or mass will be sent for examination. The sample or mass may be examined during the biopsy. If the sample or mass contains cancer cells, some or all of the thyroid gland may be removed. The incision is closed with stitches. °AFTER THE PROCEDURE  °Your recovery will be assessed and monitored. If there are no problems, as an outpatient, you should be able to go home shortly after the procedure. °If you had a fine needle biopsy: °· You may have soreness at the biopsy site for 1 to 2 days. °If you had an open biopsy:  °· You may have soreness at the biopsy site for 3 to 4 days. °· You may have a hoarse voice or sore throat for 1 to 2 days. °Obtaining the Test Results °It is your responsibility to obtain your test results. Do not assume everything is normal if you have not heard from your caregiver or the medical facility. It is important for you to follow up   on all of your test results. °HOME CARE INSTRUCTIONS  °· Keeping your head raised on a pillow when you are lying down may ease biopsy site discomfort. °· Supporting the back of your head and neck with both hands as you sit up from a lying position may ease biopsy site discomfort. °· Only take over-the-counter or prescription medicines for pain, discomfort, or fever as directed by your caregiver. °· Throat lozenges or gargling with warm salt  water may help to soothe a sore throat. °SEEK IMMEDIATE MEDICAL CARE IF:  °· You have severe bleeding from the biopsy site. °· You have difficulty swallowing. °· You have a fever. °· You have increased pain, swelling, redness, or warmth at the biopsy site. °· You notice pus coming from the biopsy site. °· You have swollen glands (lymph nodes) in your neck. °Document Released: 04/19/2007 Document Revised: 10/17/2012 Document Reviewed: 09/14/2013 °ExitCare® Patient Information ©2015 [employer]. This information is not intended to replace advice given to you by your health care provider. Make sure you discuss any questions you have with your health care provider. ° °

## 2014-12-13 NOTE — Procedures (Signed)
PreOperative Dx: RIGHT thyroid nodule Postoperative Dx: RIGHT thyroid nodule Procedure:   US guided FNA of RIGHT thyroid nodule Radiologist:  Annikah Lovins Anesthesia:  3 ml of 2% lidocaine Specimen:  FNA x 4  EBL:   < 1 ml Complications: None 

## 2015-02-04 DIAGNOSIS — C649 Malignant neoplasm of unspecified kidney, except renal pelvis: Secondary | ICD-10-CM

## 2015-02-04 HISTORY — DX: Malignant neoplasm of unspecified kidney, except renal pelvis: C64.9

## 2015-02-14 NOTE — Patient Instructions (Addendum)
21 Mason Scott  02/14/2015   Your procedure is scheduled on:   02-21-2015 Thursday  Enter through [facility]  Entrance and follow signs to [facility]. Arrive at    0830    AM .  (Limit 1 person with you).  Call this number if you have problems the morning of surgery: [phone]  Or Presurgical Testing [phone].   For Living Will and/or Health Care Power Attorney Forms: please provide copy for your medical record,may bring AM of surgery(Forms should be already notarized -we do not provide this service).(02-15-15 Yes, No information preferred to be brought to hospital).  Remember: Follow any bowel prep instructions per MD office. For Cpap use: Bring mask and tubing only.   Do not eat food/ or drink: After Midnight.      Take these medicines the morning of surgery with A SIP OF WATER: NONE.   Do not wear jewelry, make-up or nail polish.  Do not wear deodorant, lotions, powders, or perfumes.   Do not shave legs and under arms- 48 hours(2 days) prior to first CHG shower.(Shaving face and neck okay.)  Do not bring valuables to the hospital.(Hospital is not responsible for lost valuables).  Contacts, dentures or removable bridgework, body piercing, hair pins may not be worn into surgery.  Leave suitcase in the car. After surgery it may be brought to your room.  For patients admitted to the hospital, checkout time is 11:00 AM the day of discharge.(Restricted visitors-Any Persons displaying flu-like symptoms or illness).    Patients discharged the day of surgery will not be allowed to drive home. Must have responsible person with you x 24 hours once discharged.  Name and phone number of your driver: Mason Scott, spouse [phone] cell     Please read over the following fact sheets that you were given:  CHG(Chlorhexidine Gluconate 4% Surgical Soap) use, Blood Transfusion fact sheet, Incentive Spirometry Instruction.  Remember : Type/Screen "Blue armbands" - may not be removed  once applied(would result in being retested AM of surgery, if removed).         [place] - Preparing for Surgery Before surgery, you can play an important role.  Because skin is not sterile, your skin needs to be as free of germs as possible.  You can reduce the number of germs on your skin by washing with CHG (chlorahexidine gluconate) soap before surgery.  CHG is an antiseptic cleaner which kills germs and bonds with the skin to continue killing germs even after washing. Please DO NOT use if you have an allergy to CHG or antibacterial soaps.  If your skin becomes reddened/irritated stop using the CHG and inform your nurse when you arrive at Short Stay. Do not shave (including legs and underarms) for at least 48 hours prior to the first CHG shower.  You may shave your face/neck. Please follow these instructions carefully:  1.  Shower with CHG Soap the night before surgery and the  morning of Surgery.  2.  If you choose to wash your hair, wash your hair first as usual with your  normal  shampoo.  3.  After you shampoo, rinse your hair and body thoroughly to remove the  shampoo.                           4.  Use CHG as you would any other liquid soap.  You can apply chg directly  to the skin and  wash                       Gently with a scrungie or clean washcloth.  5.  Apply the CHG Soap to your body ONLY FROM THE NECK DOWN.   Do not use on face/ open                           Wound or open sores. Avoid contact with eyes, ears mouth and genitals (private parts).                       Wash face,  Genitals (private parts) with your normal soap.             6.  Wash thoroughly, paying special attention to the area where your surgery  will be performed.  7.  Thoroughly rinse your body with warm water from the neck down.  8.  DO NOT shower/wash with your normal soap after using and rinsing off  the CHG Soap.                9.  Pat yourself dry with a clean towel.            10.  Wear clean  pajamas.            11.  Place clean sheets on your bed the night of your first shower and do not  sleep with pets. Day of Surgery : Do not apply any lotions/deodorants the morning of surgery.  Please wear clean clothes to the hospital/surgery center.  FAILURE TO FOLLOW THESE INSTRUCTIONS MAY RESULT IN THE CANCELLATION OF YOUR SURGERY PATIENT SIGNATURE_________________________________  NURSE SIGNATURE__________________________________  ________________________________________________________________________   Mason Scott  An incentive spirometer is a tool that can help keep your lungs clear and active. This tool measures how well you are filling your lungs with each breath. Taking long deep breaths may help reverse or decrease the chance of developing breathing (pulmonary) problems (especially infection) following:  A long period of time when you are unable to move or be active. BEFORE THE PROCEDURE   If the spirometer includes an indicator to show your best effort, your nurse or respiratory therapist will set it to a desired goal.  If possible, sit up straight or lean slightly forward. Try not to slouch.  Hold the incentive spirometer in an upright position. INSTRUCTIONS FOR USE   Sit on the edge of your bed if possible, or sit up as far as you can in bed or on a chair.  Hold the incentive spirometer in an upright position.  Breathe out normally.  Place the mouthpiece in your mouth and seal your lips tightly around it.  Breathe in slowly and as deeply as possible, raising the piston or the ball toward the top of the column.  Hold your breath for 3-5 seconds or for as long as possible. Allow the piston or ball to fall to the bottom of the column.  Remove the mouthpiece from your mouth and breathe out normally.  Rest for a few seconds and repeat Steps 1 through 7 at least 10 times every 1-2 hours when you are awake. Take your time and take a few normal breaths between  deep breaths.  The spirometer may include an indicator to show your best effort. Use the indicator as a goal to work toward during each repetition.  After each set of 10  deep breaths, practice coughing to be sure your lungs are clear. If you have an incision (the cut made at the time of surgery), support your incision when coughing by placing a pillow or rolled up towels firmly against it. Once you are able to get out of bed, walk around indoors and cough well. You may stop using the incentive spirometer when instructed by your caregiver.  RISKS AND COMPLICATIONS  Take your time so you do not get dizzy or light-headed.  If you are in pain, you may need to take or ask for pain medication before doing incentive spirometry. It is harder to take a deep breath if you are having pain. AFTER USE  Rest and breathe slowly and easily.  It can be helpful to keep track of a log of your progress. Your caregiver can provide you with a simple table to help with this. If you are using the spirometer at home, follow these instructions: [place] IF:   You are having difficultly using the spirometer.  You have trouble using the spirometer as often as instructed.  Your pain medication is not giving enough relief while using the spirometer.  You develop fever of 100.5 F (38.1 C) or higher. SEEK IMMEDIATE MEDICAL CARE IF:   You cough up bloody sputum that had not been present before.  You develop fever of 102 F (38.9 C) or greater.  You develop worsening pain at or near the incision site. MAKE SURE YOU:   Understand these instructions.  Will watch your condition.  Will get help right away if you are not doing well or get worse. Document Released: 11/02/2006 Document Revised: 09/14/2011 Document Reviewed: 01/03/2007 ExitCare Patient Information 2014 [employer], [place].   ________________________________________________________________________  WHAT IS A BLOOD TRANSFUSION? Blood  Transfusion Information  A transfusion is the replacement of blood or some of its parts. Blood is made up of multiple cells which provide different functions.  Red blood cells carry oxygen and are used for blood loss replacement.  White blood cells fight against infection.  Platelets control bleeding.  Plasma helps clot blood.  Other blood products are available for specialized needs, such as hemophilia or other clotting disorders. BEFORE THE TRANSFUSION  Who gives blood for transfusions?   Healthy volunteers who are fully evaluated to make sure their blood is safe. This is blood bank blood. Transfusion therapy is the safest it has ever been in the practice of medicine. Before blood is taken from a donor, a complete history is taken to make sure that person has no history of diseases nor engages in risky social behavior (examples are intravenous drug use or sexual activity with multiple partners). The donor's travel history is screened to minimize risk of transmitting infections, such as malaria. The donated blood is tested for signs of infectious diseases, such as HIV and hepatitis. The blood is then tested to be sure it is compatible with you in order to minimize the chance of a transfusion reaction. If you or a relative donates blood, this is often done in anticipation of surgery and is not appropriate for emergency situations. It takes many days to process the donated blood. RISKS AND COMPLICATIONS Although transfusion therapy is very safe and saves many lives, the main dangers of transfusion include:   Getting an infectious disease.  Developing a transfusion reaction. This is an allergic reaction to something in the blood you were given. Every precaution is taken to prevent this. The decision to have a blood transfusion has  been considered carefully by your caregiver before blood is given. Blood is not given unless the benefits outweigh the risks. AFTER THE TRANSFUSION  Right after  receiving a blood transfusion, you will usually feel much better and more energetic. This is especially true if your red blood cells have gotten low (anemic). The transfusion raises the level of the red blood cells which carry oxygen, and this usually causes an energy increase.  The nurse administering the transfusion will monitor you carefully for complications. HOME CARE INSTRUCTIONS  No special instructions are needed after a transfusion. You may find your energy is better. Speak with your caregiver about any limitations on activity for underlying diseases you may have. SEEK MEDICAL CARE IF:   Your condition is not improving after your transfusion.  You develop redness or irritation at the intravenous (IV) site. SEEK IMMEDIATE MEDICAL CARE IF:  Any of the following symptoms occur over the next 12 hours:  Shaking chills.  You have a temperature by mouth above 102 F (38.9 C), not controlled by medicine.  Chest, back, or muscle pain.  People around you feel you are not acting correctly or are confused.  Shortness of breath or difficulty breathing.  Dizziness and fainting.  You get a rash or develop hives.  You have a decrease in urine output.  Your urine turns a dark color or changes to pink, red, or brown. Any of the following symptoms occur over the next 10 days:  You have a temperature by mouth above 102 F (38.9 C), not controlled by medicine.  Shortness of breath.  Weakness after normal activity.  The white part of the eye turns yellow (jaundice).  You have a decrease in the amount of urine or are urinating less often.  Your urine turns a dark color or changes to pink, red, or brown. Document Released: 06/19/2000 Document Revised: 09/14/2011 Document Reviewed: 02/06/2008 [facility] Patient Information 2014 [place], [place].  _______________________________________________________________________

## 2015-02-15 ENCOUNTER — Encounter (HOSPITAL_COMMUNITY): Payer: Self-pay

## 2015-02-15 ENCOUNTER — Encounter (HOSPITAL_COMMUNITY)
Admission: RE | Admit: 2015-02-15 | Discharge: 2015-02-15 | Disposition: A | Discharge status: Home / Self Care | Payer: Medicare Other | Source: Ambulatory Visit | Attending: Urology | Admitting: Urology

## 2015-02-15 DIAGNOSIS — Z01812 Encounter for preprocedural laboratory examination: Secondary | ICD-10-CM | POA: Diagnosis not present

## 2015-02-15 DIAGNOSIS — N2889 Other specified disorders of kidney and ureter: Secondary | ICD-10-CM | POA: Insufficient documentation

## 2015-02-15 DIAGNOSIS — C679 Malignant neoplasm of bladder, unspecified: Secondary | ICD-10-CM | POA: Insufficient documentation

## 2015-02-15 DIAGNOSIS — Z0183 Encounter for blood typing: Secondary | ICD-10-CM | POA: Diagnosis not present

## 2015-02-15 HISTORY — DX: Chronic kidney disease, unspecified: N18.9

## 2015-02-15 LAB — BASIC METABOLIC PANEL
Anion gap: 7 (ref 5–15)
BUN: 18 mg/dL (ref 6–20)
CALCIUM: 9.2 mg/dL (ref 8.9–10.3)
CHLORIDE: 103 mmol/L (ref 101–111)
CO2: 29 mmol/L (ref 22–32)
CREATININE: 1.11 mg/dL (ref 0.61–1.24)
GLUCOSE: 95 mg/dL (ref 65–99)
Potassium: 4 mmol/L (ref 3.5–5.1)
Sodium: 139 mmol/L (ref 135–145)

## 2015-02-15 LAB — CBC
HEMATOCRIT: 40.5 % (ref 39.0–52.0)
Hemoglobin: 14 g/dL (ref 13.0–17.0)
MCH: 31.5 pg (ref 26.0–34.0)
MCHC: 34.6 g/dL (ref 30.0–36.0)
MCV: 91 fL (ref 78.0–100.0)
Platelets: 207 10*3/uL (ref 150–400)
RBC: 4.45 MIL/uL (ref 4.22–5.81)
RDW: 12.4 % (ref 11.5–15.5)
WBC: 6.8 10*3/uL (ref 4.0–10.5)

## 2015-02-15 LAB — ABO/RH: ABO/RH(D): A POS

## 2015-02-15 NOTE — Pre-Procedure Instructions (Addendum)
02-15-15 EKG 4'16, CXR 4'16 Epic. Note to Dr. Alinda Money to note report of CXR.

## 2015-02-15 NOTE — Progress Notes (Signed)
02-15-15 CXR report in Epic 4'16-please note report.

## 2015-02-21 ENCOUNTER — Inpatient Hospital Stay (HOSPITAL_COMMUNITY)
Admission: RE | Admit: 2015-02-21 | Discharge: 2015-02-23 | DRG: 661 | Disposition: A | Discharge status: Home / Self Care | Payer: Medicare Other | Source: Ambulatory Visit | Attending: Urology | Admitting: Urology

## 2015-02-21 ENCOUNTER — Encounter (HOSPITAL_COMMUNITY)
Admission: RE | Disposition: A | Discharge status: Home / Self Care | Payer: Self-pay | Source: Ambulatory Visit | Attending: Urology

## 2015-02-21 ENCOUNTER — Inpatient Hospital Stay (HOSPITAL_COMMUNITY): Payer: Medicare Other | Admitting: Anesthesiology

## 2015-02-21 ENCOUNTER — Encounter (HOSPITAL_COMMUNITY): Payer: Self-pay | Admitting: Anesthesiology

## 2015-02-21 DIAGNOSIS — C61 Malignant neoplasm of prostate: Secondary | ICD-10-CM | POA: Diagnosis present

## 2015-02-21 DIAGNOSIS — Z803 Family history of malignant neoplasm of breast: Secondary | ICD-10-CM | POA: Diagnosis not present

## 2015-02-21 DIAGNOSIS — D49519 Neoplasm of unspecified behavior of unspecified kidney: Secondary | ICD-10-CM | POA: Diagnosis present

## 2015-02-21 DIAGNOSIS — N2889 Other specified disorders of kidney and ureter: Principal | ICD-10-CM | POA: Diagnosis present

## 2015-02-21 DIAGNOSIS — Z791 Long term (current) use of non-steroidal anti-inflammatories (NSAID): Secondary | ICD-10-CM | POA: Diagnosis not present

## 2015-02-21 DIAGNOSIS — Z7982 Long term (current) use of aspirin: Secondary | ICD-10-CM

## 2015-02-21 DIAGNOSIS — I10 Essential (primary) hypertension: Secondary | ICD-10-CM | POA: Diagnosis present

## 2015-02-21 DIAGNOSIS — E079 Disorder of thyroid, unspecified: Secondary | ICD-10-CM | POA: Diagnosis present

## 2015-02-21 DIAGNOSIS — R918 Other nonspecific abnormal finding of lung field: Secondary | ICD-10-CM | POA: Diagnosis present

## 2015-02-21 DIAGNOSIS — M1990 Unspecified osteoarthritis, unspecified site: Secondary | ICD-10-CM | POA: Diagnosis present

## 2015-02-21 DIAGNOSIS — Z79899 Other long term (current) drug therapy: Secondary | ICD-10-CM

## 2015-02-21 DIAGNOSIS — E785 Hyperlipidemia, unspecified: Secondary | ICD-10-CM | POA: Diagnosis present

## 2015-02-21 DIAGNOSIS — C679 Malignant neoplasm of bladder, unspecified: Secondary | ICD-10-CM | POA: Diagnosis present

## 2015-02-21 DIAGNOSIS — G4730 Sleep apnea, unspecified: Secondary | ICD-10-CM | POA: Diagnosis present

## 2015-02-21 HISTORY — PX: CYSTOSCOPY: SHX5120

## 2015-02-21 HISTORY — PX: ROBOTIC ASSITED PARTIAL NEPHRECTOMY: SHX6087

## 2015-02-21 LAB — BASIC METABOLIC PANEL
Anion gap: 9 (ref 5–15)
BUN: 16 mg/dL (ref 6–20)
CHLORIDE: 104 mmol/L (ref 101–111)
CO2: 25 mmol/L (ref 22–32)
Calcium: 8.1 mg/dL — ABNORMAL LOW (ref 8.9–10.3)
Creatinine, Ser: 1.33 mg/dL — ABNORMAL HIGH (ref 0.61–1.24)
GFR calc Af Amer: >60 mL/min (ref >60)
GFR calc non Af Amer: 54 mL/min — ABNORMAL LOW (ref >60)
GLUCOSE: 161 mg/dL — AB (ref 65–99)
POTASSIUM: 3.4 mmol/L — AB (ref 3.5–5.1)
Sodium: 138 mmol/L (ref 135–145)

## 2015-02-21 LAB — TYPE AND SCREEN
ABO/RH(D): A POS
Antibody Screen: NEGATIVE

## 2015-02-21 LAB — HEMOGLOBIN AND HEMATOCRIT, BLOOD
HCT: 36.5 % — ABNORMAL LOW (ref 39.0–52.0)
Hemoglobin: 12.4 g/dL — ABNORMAL LOW (ref 13.0–17.0)

## 2015-02-21 SURGERY — ROBOTIC ASSITED PARTIAL NEPHRECTOMY
Anesthesia: General | Laterality: Right

## 2015-02-21 MED ORDER — LACTATED RINGERS IV SOLN
INTRAVENOUS | Status: DC
  Administered 2015-02-21 (×3): via INTRAVENOUS

## 2015-02-21 MED ORDER — DEXTROSE-NACL 5-0.45 % IV SOLN
INTRAVENOUS | Status: DC
  Administered 2015-02-21: 20:00:00 via INTRAVENOUS

## 2015-02-21 MED ORDER — GLYCOPYRROLATE 0.2 MG/ML IJ SOLN
INTRAMUSCULAR | Status: DC | PRN
  Administered 2015-02-21: 0.2 mg via INTRAVENOUS
  Administered 2015-02-21: 0.6 mg via INTRAVENOUS

## 2015-02-21 MED ORDER — DIPHENHYDRAMINE HCL 12.5 MG/5ML PO ELIX: 12.5000 mg | ORAL_SOLUTION | Freq: Four times a day (QID) | ORAL | Status: DC | PRN

## 2015-02-21 MED ORDER — PROPOFOL 10 MG/ML IV BOLUS
INTRAVENOUS | Status: AC
  Filled 2015-02-21: qty 20

## 2015-02-21 MED ORDER — HYDROCODONE-ACETAMINOPHEN 5-325 MG PO TABS: 1.0000 | ORAL_TABLET | Freq: Four times a day (QID) | ORAL | Status: DC | PRN

## 2015-02-21 MED ORDER — CEFAZOLIN SODIUM-DEXTROSE 2-3 GM-% IV SOLR
2.0000 g | INTRAVENOUS | Status: AC
  Administered 2015-02-21 (×2): 2 g via INTRAVENOUS

## 2015-02-21 MED ORDER — NEOSTIGMINE METHYLSULFATE 10 MG/10ML IV SOLN
INTRAVENOUS | Status: DC | PRN
  Administered 2015-02-21: 4 mg via INTRAVENOUS

## 2015-02-21 MED ORDER — MIDAZOLAM HCL 5 MG/5ML IJ SOLN
INTRAMUSCULAR | Status: DC | PRN
  Administered 2015-02-21: 2 mg via INTRAVENOUS

## 2015-02-21 MED ORDER — CEFAZOLIN SODIUM 1-5 GM-% IV SOLN
1.0000 g | Freq: Three times a day (TID) | INTRAVENOUS | Status: AC
Start: 2015-02-22 — End: 2015-02-22
  Administered 2015-02-22 (×2): 1 g via INTRAVENOUS
  Filled 2015-02-21 (×2): qty 50

## 2015-02-21 MED ORDER — MORPHINE SULFATE (PF) 2 MG/ML IV SOLN
2.0000 mg | INTRAVENOUS | Status: DC | PRN
  Administered 2015-02-22 (×2): 2 mg via INTRAVENOUS
  Filled 2015-02-21 (×2): qty 1

## 2015-02-21 MED ORDER — FENTANYL CITRATE (PF) 100 MCG/2ML IJ SOLN
INTRAMUSCULAR | Status: DC | PRN
  Administered 2015-02-21: 50 ug via INTRAVENOUS
  Administered 2015-02-21: 100 ug via INTRAVENOUS
  Administered 2015-02-21 (×5): 50 ug via INTRAVENOUS
  Administered 2015-02-21: 100 ug via INTRAVENOUS

## 2015-02-21 MED ORDER — ROCURONIUM BROMIDE 100 MG/10ML IV SOLN
INTRAVENOUS | Status: DC | PRN
  Administered 2015-02-21 (×3): 20 mg via INTRAVENOUS
  Administered 2015-02-21: 50 mg via INTRAVENOUS
  Administered 2015-02-21: 10 mg via INTRAVENOUS

## 2015-02-21 MED ORDER — AMLODIPINE BESYLATE 10 MG PO TABS
10.0000 mg | ORAL_TABLET | Freq: Every evening | ORAL | Status: DC
  Administered 2015-02-21 – 2015-02-22 (×2): 10 mg via ORAL
  Filled 2015-02-21 (×2): qty 1

## 2015-02-21 MED ORDER — BUPIVACAINE LIPOSOME 1.3 % IJ SUSP
20.0000 mL | Freq: Once | INTRAMUSCULAR | Status: AC
  Administered 2015-02-21: 40 mL
  Filled 2015-02-21: qty 20

## 2015-02-21 MED ORDER — LIDOCAINE HCL (CARDIAC) 20 MG/ML IV SOLN
INTRAVENOUS | Status: DC | PRN
  Administered 2015-02-21: 50 mg via INTRAVENOUS

## 2015-02-21 MED ORDER — MIDAZOLAM HCL 2 MG/2ML IJ SOLN
INTRAMUSCULAR | Status: AC
  Filled 2015-02-21: qty 4

## 2015-02-21 MED ORDER — DEXAMETHASONE SODIUM PHOSPHATE 10 MG/ML IJ SOLN
INTRAMUSCULAR | Status: AC
  Filled 2015-02-21: qty 1

## 2015-02-21 MED ORDER — CETYLPYRIDINIUM CHLORIDE 0.05 % MT LIQD
7.0000 mL | Freq: Two times a day (BID) | OROMUCOSAL | Status: DC
  Administered 2015-02-22 (×2): 7 mL via OROMUCOSAL

## 2015-02-21 MED ORDER — DIPHENHYDRAMINE HCL 50 MG/ML IJ SOLN: 12.5000 mg | Freq: Four times a day (QID) | INTRAMUSCULAR | Status: DC | PRN

## 2015-02-21 MED ORDER — DEXAMETHASONE SODIUM PHOSPHATE 10 MG/ML IJ SOLN
INTRAMUSCULAR | Status: DC | PRN
  Administered 2015-02-21: 10 mg via INTRAVENOUS

## 2015-02-21 MED ORDER — SIMVASTATIN 10 MG PO TABS
5.0000 mg | ORAL_TABLET | Freq: Every evening | ORAL | Status: DC
  Administered 2015-02-21 – 2015-02-22 (×2): 5 mg via ORAL
  Filled 2015-02-21 (×2): qty 1

## 2015-02-21 MED ORDER — ONDANSETRON HCL 4 MG/2ML IJ SOLN: 4.0000 mg | INTRAMUSCULAR | Status: DC | PRN

## 2015-02-21 MED ORDER — ROCURONIUM BROMIDE 100 MG/10ML IV SOLN
INTRAVENOUS | Status: AC
  Filled 2015-02-21: qty 1

## 2015-02-21 MED ORDER — CEFAZOLIN SODIUM-DEXTROSE 2-3 GM-% IV SOLR
INTRAVENOUS | Status: AC
  Filled 2015-02-21: qty 50

## 2015-02-21 MED ORDER — SODIUM CHLORIDE 0.9 % IJ SOLN
INTRAMUSCULAR | Status: AC
  Filled 2015-02-21: qty 20

## 2015-02-21 MED ORDER — HYDROMORPHONE HCL 1 MG/ML IJ SOLN
INTRAMUSCULAR | Status: DC | PRN
  Administered 2015-02-21 (×2): 1 mg via INTRAVENOUS

## 2015-02-21 MED ORDER — LIDOCAINE HCL (CARDIAC) 20 MG/ML IV SOLN
INTRAVENOUS | Status: AC
  Filled 2015-02-21: qty 5

## 2015-02-21 MED ORDER — SODIUM CHLORIDE 0.9 % IJ SOLN
INTRAMUSCULAR | Status: AC
  Filled 2015-02-21: qty 10

## 2015-02-21 MED ORDER — CHLORHEXIDINE GLUCONATE 0.12 % MT SOLN
15.0000 mL | Freq: Two times a day (BID) | OROMUCOSAL | Status: DC
  Administered 2015-02-22 – 2015-02-23 (×3): 15 mL via OROMUCOSAL
  Filled 2015-02-21 (×4): qty 15

## 2015-02-21 MED ORDER — ONDANSETRON HCL 4 MG/2ML IJ SOLN
INTRAMUSCULAR | Status: AC
  Filled 2015-02-21: qty 2

## 2015-02-21 MED ORDER — HYDROMORPHONE HCL 1 MG/ML IJ SOLN: 0.2500 mg | INTRAMUSCULAR | Status: DC | PRN

## 2015-02-21 MED ORDER — ACETAMINOPHEN 10 MG/ML IV SOLN
INTRAVENOUS | Status: AC
  Filled 2015-02-21: qty 100

## 2015-02-21 MED ORDER — SODIUM CHLORIDE 0.9 % IR SOLN
Status: DC | PRN
  Administered 2015-02-21: 1000 mL

## 2015-02-21 MED ORDER — EPHEDRINE SULFATE 50 MG/ML IJ SOLN
INTRAMUSCULAR | Status: AC
  Filled 2015-02-21: qty 1

## 2015-02-21 MED ORDER — LACTATED RINGERS IR SOLN
Status: DC | PRN
  Administered 2015-02-21: 1000 mL

## 2015-02-21 MED ORDER — CEFAZOLIN SODIUM 1-5 GM-% IV SOLN: 1.0000 g | Freq: Three times a day (TID) | INTRAVENOUS | Status: DC

## 2015-02-21 MED ORDER — ACETAMINOPHEN 10 MG/ML IV SOLN
1000.0000 mg | Freq: Once | INTRAVENOUS | Status: AC
  Administered 2015-02-21: 1000 mg via INTRAVENOUS

## 2015-02-21 MED ORDER — EPHEDRINE SULFATE 50 MG/ML IJ SOLN
INTRAMUSCULAR | Status: DC | PRN
  Administered 2015-02-21: 10 mg via INTRAVENOUS
  Administered 2015-02-21: 5 mg via INTRAVENOUS

## 2015-02-21 MED ORDER — SUCCINYLCHOLINE CHLORIDE 20 MG/ML IJ SOLN
INTRAMUSCULAR | Status: DC | PRN
  Administered 2015-02-21: 160 mg via INTRAVENOUS

## 2015-02-21 MED ORDER — STERILE WATER FOR IRRIGATION IR SOLN
Status: DC | PRN
  Administered 2015-02-21: 1000 mL

## 2015-02-21 MED ORDER — INDOCYANINE GREEN 25 MG IV SOLR
INTRAVENOUS | Status: DC | PRN
  Administered 2015-02-21: 3.75 mg via INTRAVENOUS

## 2015-02-21 MED ORDER — MANNITOL 25 % IV SOLN
25.0000 g | Freq: Once | INTRAVENOUS | Status: AC
  Administered 2015-02-21 (×2): 12.5 g via INTRAVENOUS
  Filled 2015-02-21: qty 100

## 2015-02-21 MED ORDER — FENTANYL CITRATE (PF) 250 MCG/5ML IJ SOLN
INTRAMUSCULAR | Status: AC
  Filled 2015-02-21: qty 25

## 2015-02-21 MED ORDER — SULFAMETHOXAZOLE-TRIMETHOPRIM 800-160 MG PO TABS: 1.0000 | ORAL_TABLET | Freq: Two times a day (BID) | ORAL | Status: DC

## 2015-02-21 MED ORDER — HYDROMORPHONE HCL 2 MG/ML IJ SOLN
INTRAMUSCULAR | Status: AC
  Filled 2015-02-21: qty 1

## 2015-02-21 MED ORDER — DOCUSATE SODIUM 100 MG PO CAPS
100.0000 mg | ORAL_CAPSULE | Freq: Two times a day (BID) | ORAL | Status: DC
  Administered 2015-02-21 – 2015-02-23 (×4): 100 mg via ORAL
  Filled 2015-02-21 (×4): qty 1

## 2015-02-21 MED ORDER — PROMETHAZINE HCL 25 MG/ML IJ SOLN: 6.2500 mg | INTRAMUSCULAR | Status: DC | PRN

## 2015-02-21 SURGICAL SUPPLY — 71 items
AGENT HMST MTR 8 SURGIFLO (HEMOSTASIS) ×2
APL ESCP 34 STRL LF DISP (HEMOSTASIS) ×2
APPLICATOR SURGIFLO ENDO (HEMOSTASIS) ×2 IMPLANT
BAG SPEC RTRVL LRG 6X4 10 (ENDOMECHANICALS) ×2
BAG URINE DRAINAGE (UROLOGICAL SUPPLIES) IMPLANT
BAG URO CATCHER STRL LF (DRAPE) ×2 IMPLANT
CABLE HIGH FREQUENCY MONO STRZ (ELECTRODE) ×4 IMPLANT
CHLORAPREP W/TINT 26ML (MISCELLANEOUS) ×4 IMPLANT
CLIP LIGATING HEM O LOK PURPLE (MISCELLANEOUS) ×6 IMPLANT
CLIP LIGATING HEMO O LOK GREEN (MISCELLANEOUS) ×4 IMPLANT
CORDS BIPOLAR (ELECTRODE) ×4 IMPLANT
COVER BACK TABLE 60X90IN (DRAPES) ×4 IMPLANT
COVER SURGICAL LIGHT HANDLE (MISCELLANEOUS) ×4 IMPLANT
COVER TIP SHEARS 8 DVNC (MISCELLANEOUS) ×2 IMPLANT
COVER TIP SHEARS 8MM DA VINCI (MISCELLANEOUS) ×2
DECANTER SPIKE VIAL GLASS SM (MISCELLANEOUS) ×4 IMPLANT
DRAIN CHANNEL 15F RND FF 3/16 (WOUND CARE) ×4 IMPLANT
DRAPE INCISE IOBAN 66X45 STRL (DRAPES) ×4 IMPLANT
DRAPE LAPAROSCOPIC ABDOMINAL (DRAPES) ×4 IMPLANT
DRAPE SHEET LG 3/4 BI-LAMINATE (DRAPES) ×4 IMPLANT
DRAPE TABLE BACK 44X90 PK DISP (DRAPES) ×4 IMPLANT
DRAPE WARM FLUID 44X44 (DRAPE) ×4 IMPLANT
ELECT PENCIL ROCKER SW 15FT (MISCELLANEOUS) ×4 IMPLANT
ELECT REM PT RETURN 9FT ADLT (ELECTROSURGICAL) ×4
ELECTRODE REM PT RTRN 9FT ADLT (ELECTROSURGICAL) ×2 IMPLANT
EVACUATOR MICROVAS BLADDER (UROLOGICAL SUPPLIES) IMPLANT
EVACUATOR SILICONE 100CC (DRAIN) ×4 IMPLANT
GLOVE BIO SURGEON STRL SZ 6.5 (GLOVE) ×3 IMPLANT
GLOVE BIO SURGEONS STRL SZ 6.5 (GLOVE) ×1
GLOVE BIOGEL M STRL SZ7.5 (GLOVE) ×8 IMPLANT
GOWN STRL REUS W/TWL LRG LVL3 (GOWN DISPOSABLE) ×12 IMPLANT
HEMOSTAT SURGICEL 4X8 (HEMOSTASIS) IMPLANT
KIT ACCESSORY DA VINCI DISP (KITS) ×2
KIT ACCESSORY DVNC DISP (KITS) ×2 IMPLANT
KIT ASPIRATION TUBING (SET/KITS/TRAYS/PACK) IMPLANT
KIT BASIN OR (CUSTOM PROCEDURE TRAY) ×4 IMPLANT
KIT PROCEDURE DA VINCI SI (MISCELLANEOUS) ×2
KIT PROCEDURE DVNC SI (MISCELLANEOUS) IMPLANT
LIQUID BAND (GAUZE/BANDAGES/DRESSINGS) ×8 IMPLANT
LOOP CUT BIPOLAR 24F LRG (ELECTROSURGICAL) IMPLANT
MANIFOLD NEPTUNE II (INSTRUMENTS) ×4 IMPLANT
PACK CYSTO (CUSTOM PROCEDURE TRAY) ×2 IMPLANT
POSITIONER SURGICAL ARM (MISCELLANEOUS) ×8 IMPLANT
POUCH SPECIMEN RETRIEVAL 10MM (ENDOMECHANICALS) ×4 IMPLANT
SET IRRIG Y TYPE TUR BLADDER L (SET/KITS/TRAYS/PACK) ×2 IMPLANT
SET TUBE IRRIG SUCTION NO TIP (IRRIGATION / IRRIGATOR) ×4 IMPLANT
SOLUTION ELECTROLUBE (MISCELLANEOUS) ×4 IMPLANT
SPOGE SURGIFLO 8M (HEMOSTASIS) ×2
SPONGE SURGIFLO 8M (HEMOSTASIS) IMPLANT
SURGIFLO W/THROMBIN 8M KIT (HEMOSTASIS) ×4 IMPLANT
SUT ETHILON 3 0 PS 1 (SUTURE) ×4 IMPLANT
SUT MNCRL AB 4-0 PS2 18 (SUTURE) ×8 IMPLANT
SUT PROLENE 4 0 RB 1 (SUTURE) ×4
SUT PROLENE 4-0 RB1 .5 CRCL 36 (SUTURE) IMPLANT
SUT V-LOC BARB 180 2/0GR6 GS22 (SUTURE) ×4
SUT VIC AB 0 CT1 27 (SUTURE) ×4
SUT VIC AB 0 CT1 27XBRD ANTBC (SUTURE) ×2 IMPLANT
SUT VICRYL 0 UR6 27IN ABS (SUTURE) ×8 IMPLANT
SUT VLOC BARB 180 ABS3/0GR12 (SUTURE) ×4
SUTURE V-LC BRB 180 2/0GR6GS22 (SUTURE) ×2 IMPLANT
SUTURE VLOC BRB 180 ABS3/0GR12 (SUTURE) ×2 IMPLANT
SYRINGE IRR TOOMEY STRL 70CC (SYRINGE) IMPLANT
TOWEL OR 17X26 10 PK STRL BLUE (TOWEL DISPOSABLE) ×8 IMPLANT
TRAY FOLEY W/METER SILVER 16FR (SET/KITS/TRAYS/PACK) ×4 IMPLANT
TRAY LAPAROSCOPIC (CUSTOM PROCEDURE TRAY) ×4 IMPLANT
TROCAR BLADELESS OPT 5 100 (ENDOMECHANICALS) ×4 IMPLANT
TROCAR UNIVERSAL OPT 12M 100M (ENDOMECHANICALS) ×4 IMPLANT
TROCAR XCEL 12X100 BLDLESS (ENDOMECHANICALS) ×4 IMPLANT
TUBING CONNECTING 10 (TUBING) ×3 IMPLANT
TUBING CONNECTING 10' (TUBING) ×1
WATER STERILE IRR 1500ML POUR (IV SOLUTION) ×8 IMPLANT

## 2015-02-21 NOTE — Progress Notes (Signed)
ETCO2 monitpring removed due to patient being placed on CPAP. Will replace in AM when CPAP is removed.

## 2015-02-21 NOTE — Anesthesia Preprocedure Evaluation (Addendum)
Anesthesia Evaluation  Patient identified by MRN, date of birth, ID band Patient awake    Reviewed: Allergy & Precautions, NPO status , Patient's Chart, lab work & pertinent test results  Airway Mallampati: II  TM Distance: >3 FB Neck ROM: Full    Dental  (+) Dental Advisory Given,    Pulmonary sleep apnea and Continuous Positive Airway Pressure Ventilation ,  breath sounds clear to auscultation  Pulmonary exam normal       Cardiovascular Exercise Tolerance: Good hypertension, Pt. on medications Normal cardiovascular examRhythm:Regular Rate:Normal  ECG: RBBB   Neuro/Psych negative neurological ROS  negative psych ROS   GI/Hepatic negative GI ROS, Neg liver ROS,   Endo/Other  negative endocrine ROS  Renal/GU Renal diseaseChronic kidney disease.  negative genitourinary   Musculoskeletal  (+) Arthritis -,   Abdominal (+) + obese,   Peds negative pediatric ROS (+)  Hematology negative hematology ROS (+)   Anesthesia Other Findings   Reproductive/Obstetrics negative OB ROS                            Anesthesia Physical Anesthesia Plan  ASA: II  Anesthesia Plan: General   Post-op Pain Management:    Induction: Intravenous  Airway Management Planned: Oral ETT  Additional Equipment:   Intra-op Plan:   Post-operative Plan: Extubation in OR  Informed Consent: I have reviewed the patients History and Physical, chart, labs and discussed the procedure including the risks, benefits and alternatives for the proposed anesthesia with the patient or authorized representative who has indicated his/her understanding and acceptance.   Dental advisory given  Plan Discussed with: CRNA  Anesthesia Plan Comments:         Anesthesia Quick Evaluation

## 2015-02-21 NOTE — Transfer of Care (Signed)
Immediate Anesthesia Transfer of Care Note  Patient: Mason Scott  Procedure(s) Performed: Procedure(s): ROBOTIC ASSITED PARTIAL NEPHRECTOMY (Right) CYSTOSCOPY FLEXIBLE (N/A)  Patient Location: PACU  Anesthesia Type:General  Level of Consciousness: awake, alert , oriented and patient cooperative  Airway & Oxygen Therapy: Patient Spontanous Breathing and Patient connected to face mask oxygen  Post-op Assessment: Report given to RN, Post -op Vital signs reviewed and stable and Patient moving all extremities X 4  Post vital signs: stable  Last Vitals:  Filed Vitals:   02/21/15 1700  BP: 137/75  Pulse: 65  Temp: 36.5 C  Resp: 8    Complications: No apparent anesthesia complications

## 2015-02-21 NOTE — Anesthesia Postprocedure Evaluation (Signed)
  Anesthesia Post-op Note  Patient: Mason Scott  Procedure(s) Performed: Procedure(s) (LRB): ROBOTIC ASSITED PARTIAL NEPHRECTOMY (Right) CYSTOSCOPY FLEXIBLE (N/A)  Patient Location: PACU  Anesthesia Type: General  Level of Consciousness: awake and alert   Airway and Oxygen Therapy: Patient Spontanous Breathing  Post-op Pain: mild  Post-op Assessment: Post-op Vital signs reviewed, Patient's Cardiovascular Status Stable, Respiratory Function Stable, Patent Airway and No signs of Nausea or vomiting  Last Vitals:  Filed Vitals:   02/21/15 1715  BP: 134/64  Pulse: 64  Temp:   Resp: 13    Post-op Vital Signs: stable   Complications: No apparent anesthesia complications

## 2015-02-21 NOTE — Discharge Instructions (Signed)
1- Drain Sites - You may have some mild persistent drainage from old drain site for several days, this is normal. This can be covered with cotton gauze for convenience.  2 - Stiches - Your stitches are all dissolvable. You may notice a "loose thread" at your incisions, these are normal and require no intervention. You may cut them flush to the skin with fingernail clippers if needed for comfort.  3 - Diet - No restrictions  4 - Activity - No heavy lifting / straining (any activities that require valsalva or "bearing down") x 4 weeks. Otherwise, no restrictions.  5 - Bathing - You may shower immediately. Do not take a bath or get into swimming pool where incision sites are submersed in water x 4 weeks.   6 - When to Call the Doctor - Call MD for any fever >102, any acute wound problems, or any severe nausea / vomiting. You can call the [facility] [phone]) 24 hours a day 365 days a year. It will roll-over to the answering service and on-call physician after hours.   You may resume aspirin, vitamins, supplements, motrin, and aleve 7 days after surgery.

## 2015-02-21 NOTE — Op Note (Addendum)
Preoperative diagnosis: Right renal mass, History of bladder cancer  Postoperative diagnosis: Right renal mass, History of bladder cancer  Procedure:  1. Right robotic-assisted laparoscopic partial nephrectomy 2. Intraoperative renal ultrasonography 3. Flexible cystoscopy with saline bladder washing 4. Administration of indocyanine green dye  Surgeon: Roxy Horseman, Brooke Bonito. M.D.  Assistant(s): Debbrah Alar, PA-C  Resident: Dr. Verdis Frederickson  Anesthesia: General  Complications: None  EBL: 350 mL  IVF:  3500 mL crystalloid  Specimens: 1. Right renal mass 2. Saline bladder washing for cytology  Disposition of specimens: Pathology  Intraoperative findings:       1. Warm renal ischemia time: 24 minutes       2. Intraoperative renal ultrasound findings: A 2.0 cm solid mass was noted exophytic layoff posterior lower pole consistent with the patient's known renal mass and preoperative imaging.  Drains: 1. # 15 Blake perinephric drain  Indication:  Mason Scott is a 69 y.o. year old patient with a right renal mass and a history of bladder cancer.  After a thorough review of the management options for their renal mass, they elected to proceed with surgical treatment and the above procedure.  We have discussed the potential benefits and risks of the procedure, side effects of the proposed treatment, the likelihood of the patient achieving the goals of the procedure, and any potential problems that might occur during the procedure or recuperation. Informed consent has been obtained.   Description of procedure:  The patient was taken to the operating room and a general anesthetic was administered. The patient was given preoperative antibiotics. He was initially placed in the supine position and his genitalia was prepped and draped in the usual sterile fashion with Betadine.  Flexible cystoscopy was then performed which revealed a normal urethra.  Inspection of the bladder  systematically revealed ureteral orifices to be in their expected anatomic location and effluxing clear urine.  No bladder tumors, stones, or other mucosal pathology was identified throughout the bladder.  A saline bladder washing was then obtained and sent for cytologic analysis.The patient was then placed in the right modified flank position with care to pad all potential pressure points, and prepped and draped in the usual sterile fashion. Next a preoperative timeout was performed.  A site was selected on the right side of the umbilicus for placement of the camera port. This was placed using a standard open Hassan technique which allowed entry into the peritoneal cavity under direct vision and without difficulty. A 12 mm port was placed and a pneumoperitoneum established. The camera was then used to inspect the abdomen and there was no evidence of any intra-abdominal injuries or other abnormalities. The remaining abdominal ports were then placed. 8 mm robotic ports were placed in the right upper quadrant, right lower quadrant, and far right lateral abdominal wall. A 12 mm port was placed in the upper midline for laparoscopic assistance. All ports were placed under direct vision without difficulty. The surgical cart was then docked.   Utilizing the cautery scissors, the white line of Toldt was incised allowing the colon to be mobilized medially and the plane between the mesocolon and the anterior layer of Gerota's fascia to be developed and the kidney to be exposed.  The ureter and gonadal vein were identified inferiorly and the ureter was lifted anteriorly off the psoas muscle.  Dissection proceeded superiorly along the gonadal vein until the renal vein was identified.  The renal hilum was then carefully isolated with a combination of blunt  and sharp dissection allowing the renal arterial and venous structures to be separated and isolated in preparation for renal hilar vessel clamping. There was a complex  renal hilum including 3 total renal veins including a lower pole group of veins that included an anterior and posterior vein.  There was also a branching renal artery with 1 artery extending superiorly and anteriorly and the other extending posteriorly. During the hilar dissection, there was noted to be bleeding from the posterior lower pole renal vein and this was ligated with Weck clips and divided.  There also was bleeding noted from the anterior lower pole renal vein from a small avulsed tributary.  This was oversewn with a 4-0 prolene suture for repair.  12.5 g of IV mannitol was then administered.   Attention turned to the kidney and the perinephric fat surrounding the renal mass was removed and the kidney was mobilized sufficiently for exposure and resection of the renal mass. The patient had densely adherent perinephric fat that was difficult to dissect away from the renal capsule.  Eventually, the kidney was dissected freely from the surrounding perinephric fat with a small layer of perinephric densely adherent to the actual renal capsule.  For this reason, it was very difficult to identify the actual renal mass even despite its exophytic appearance on imaging.  We were eventually able to identify this lesions likely location.  Intraoperative renal ultrasonography was utilized with the laparoscopic ultrasound probe to identify the renal tumor and identify the tumor margins.This confirmed the location of the tumor and the surrounding capsule of the kidney was cleared away leaving an appropriate margin for the renal mass.   Once the renal mass was properly isolated, preparations were made for resection of the tumor.  Reconstructive sutures were placed into the abdomen for the renorrhaphy portion of the procedure.  The posterior renal artery was then clamped with bulldog clamps. Indocyanine green dye was then injected with 3 cc total.  This demonstrated that the portion of the kidney and the posterior  inferior pole appeared to be ischemic with selective arterial clamping. The tumor was then excised with cold scissor dissection along with an adequate visible gross margin of normal renal parenchyma. The tumor appeared to be excised without any gross violation of the tumor. The renal collecting system was not entered during removal of the tumor.  A running 3-0 V-lock suture was then brought through the capsule of the kidney and run along the base of the renal defect to provide hemostasis. The kidney unfortunately was very nonpliable and suture repair was not very effective.  I then preplaced 2-0 V lock sutures and placed a Surgicel bolster into the defect.. An additional hemostatic agent (Surgiflo) was then placed into the renal defect. A running 2-0 V lock suture was then used to secure the bolster which resulted in adequate hemostasis.    The bulldog clamps were then removed from the renal hilar vessel(s) and an additional 12.5 g of IV mannitol was administered. Total warm renal ischemia time was 24 minutes. The renal tumor resection site was examined. Hemostasis appeared adequate.   The kidney was placed back into its normal anatomic position and covered with perinephric fat as needed.  A # 36 Blake drain was then brought through the lateral lower port site and positioned in the perinephric space.  It was secured to the skin with a nylon suture. The surgical cart was undocked.  The renal tumor specimen was removed intact within an endopouch retrieval bag  via the camera port sites.  The camera port site and the other 12 mm port site were then closed at the fascial level with 0-vicryl suture.  All other laparoscopic/robotic ports were removed under direct vision and the pneumoperitoneum let down with inspection of the operative field performed and hemostasis again confirmed. All incision sites were then injected with local anesthetic and reapproximated at the skin level with 4-0 monocryl subcuticular closures.   Dermabond was applied to the skin.  The patient tolerated the procedure well and without complications.  The patient was able to be extubated and transferred to the recovery unit in satisfactory condition.  Pryor Curia MD

## 2015-02-21 NOTE — Progress Notes (Signed)
Pt placed on Auto CPAP 5-15 CMH20 with 3 LPM O2 bleed in via Pt home mask and tubing.  Pt tolerating well at this time.  RT to monitor and assess as needed.

## 2015-02-21 NOTE — H&P (Signed)
History of Present Illness [name] is a 69 year old with the following urologic history:    1) Prostate cancer: He was diagnosed with cT1cNx Mx, Gleason 3+3=6 adenocarcinoma of the prostate (pretreatment PSA 6.14) in November 2013 and is s/p treatment with brachytherapy (Dr. Arloa Koh) on 09/29/12.  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 He was noted to have a recurrent, high grade Ta urothelial carcinoma in 2015.    Sep 1991: TURBT, Thiotepa  Mar 2015: Erythematous changes and atypical cytology with positive FISH  Apr 2015: TURBT: High grade, Ta urothelial carcinoma of left lateral bladder  Jun-Jul 2015: Induction BCG  Sep 2015: TURBT: Suspicious cytology, Low grade Ta tumor of dome of the bladder  Sep-Oct 2015: 3 week maintenance BCG (rash developed ? related to BCG)  Jan 2016: Cytology suspicious, Upper tract imaging (CT negative), cysto negative  Feb 2016: 3 week maintenance BCG  May 2016: TUR - L posterior high grade Ta tumor, R lateral low grade Ta tumor    3) Incontinence: He presented with new onset incontinence in July 2014 associated with urgency. This resolved spontaneously without the need for treatment.    4) Bilateral renal masses: He was incidentally noted to have bilateral enhancing renal masses on CT imaging during an upper tract study for his urothelial carcinoma. An MRI confirmed enhancing renal masses including a 1.8 cm posterior lower pole right renal mass and a 2.1 cm interpolar left renal mass off the posterior hilar lip.    Baseline renal function: Cr 1.1, eGFR > 60 ml/min    Interval history:    He follows up today for further surveillance of his prostate cancer and bladder cancer as well as for a preoperative appointment prior to his planned right partial nephrectomy. He  has remained in stable health over the past couple of months. He has completed his workup for his thyroid mass and underwent an ultrasound and biopsy which did not demonstrate malignancy. He is scheduled for routine follow-up with Dr. Benjamine Mola. He denies any new health related complaints since his last visit.     Past Medical History Problems  1. History of Arthritis 2. History of hyperlipidemia (Z86.39) 3. History of hypertension (Z86.79) 4. History of sleep apnea (Z87.09)  Surgical History Problems  1. History of Cystoscopy Bladder Tumor 2. History of Cystoscopy With Biopsy 3. History of Cystoscopy With Biopsy 4. History of Cystoscopy With Fulguration Medium Lesion (2-5cm) 5. History of Surgery Prostate Transperineal Placement Of Needles  Current Meds 1. AmLODIPine Besylate 10 MG Oral Tablet;  Therapy: (GEZMOQHU:[id number]) to Recorded 2. Aspir-81 81 MG Oral Tablet Delayed Release;  Therapy: (WSFKCLEX:51ZGY1749) to Recorded 3. CPAP;  Therapy: (SWHQPRFF:63WGY6599) to Recorded 4. Fish Oil Concentrate 1000 MG Oral Capsule;  Therapy: (JTTSVXBL:39QZE0923) to Recorded 5. Meloxicam 15 MG Oral Tablet; takes 1/2 qd;  Therapy: 21Jan2014 to Recorded 6. Valsartan-Hydrochlorothiazide 160-25 MG Oral Tablet;  Therapy: (Recorded:06May2015) to Recorded 7. VESIcare 5 MG Oral Tablet; TAKE 1 TABLET As Directed;  Therapy: 30QTM2263 to (Evaluate:28May2016)  Requested for: [phone]; Last  Rx:03Jun2015 Ordered  Allergies Medication  1. No Known Drug Allergies  Family History Problems  1. Family history of Breast Cancer : Mother 2. Denied: Family history of Prostate Cancer 3. Family history of Stroke Syndrome : Father  Social History Problems  1. Denied: History of Alcohol  Use 2. Marital History - Currently Married 3. Never A Smoker  Vitals Vital Signs [Data Includes: Last 1 Day]  Recorded: 12Aug2016 12:53PM  Blood Pressure: 143 / 71 Heart Rate: 68  Physical Exam Constitutional: Well  nourished and well developed . No acute distress.  ENT:. The ears and nose are normal in appearance.  Neck: The appearance of the neck is normal and no neck mass is present.  Pulmonary: No respiratory distress, normal respiratory rhythm and effort and clear bilateral breath sounds.  Cardiovascular: Heart rate and rhythm are normal . No peripheral edema.  Abdomen: The abdomen is soft and nontender. No masses are palpated. No CVA tenderness. No hernias are palpable. No hepatosplenomegaly noted.  Lymphatics: The supraclavicular, femoral and inguinal nodes are not enlarged or tender.  Skin: Normal skin turgor, no visible rash and no visible skin lesions.  Neuro/Psych:. Mood and affect are appropriate.    Results/Data Urine [Data Includes: Last 1 Day]   12Aug2016  COLOR YELLOW   APPEARANCE CLEAR   SPECIFIC GRAVITY 1.020   pH 5.0   GLUCOSE NEGATIVE   BILIRUBIN NEGATIVE   KETONE NEGATIVE   BLOOD TRACE   PROTEIN NEGATIVE   NITRITE NEGATIVE   LEUKOCYTE ESTERASE NEGATIVE   SQUAMOUS EPITHELIAL/HPF 0-5 HPF  WBC 0-5 WBC/HPF  RBC NONE SEEN RBC/HPF  BACTERIA NONE SEEN HPF  CRYSTALS NONE SEEN HPF  CASTS NONE SEEN LPF  Yeast NONE SEEN HPF   I have reviewed his imaging studies. Findings are as dictated above.   Assessment Assessed  1. Renal mass (N28.89) 2. Prostate cancer (C61)  Plan Health Maintenance  1. UA With REFLEX; [Do Not Release]; Status:Resulted - Requires Verification;   Done:  [id number] 12:37PM Malignant neoplasm of overlapping sites of bladder  2. Cysto; Status:Hold For - Date of Service; Requested for:08Jul2016;  Prostate cancer  3. PSA; Status:In Progress - Specimen/Data Collected;   Done: [id number] 4. VENIPUNCTURE; Status:Complete;   Done: [id number] Renal mass  5. Follow-up Keep Future Appt Office  Follow-up  Status: Complete  Done: [id number]  Discussion/Summary 1. Right renal mass suspicious for malignancy: He is scheduled to proceed with his right nephrectomy next  week. This will be performed in combination with cystoscopy for his bladder cancer surveillance.   We have discussed the risks of treatment in detail including but not limited to bleeding, infection, heart attack, stroke, death, venothromoboembolism, cancer recurrence, injury/damage to surrounding organs and structures, urine leak, the possibility of open surgical conversion for patients undergoing minimally invasive surgery, the risk of developing chronic kidney disease and its associated implications, and the potential risk of end stage renal disease possibly necessitating dialysis. We reviewed the postoperative recovery process. All questions were answered to his stated satisfaction.    2. Left renal mass: It is right renal mass is confirmed to be a renal malignancy, we will likely proceed with definitive treatment of his left renal mass as well. This is in a more difficult location will require further discussion regarding optimal therapy. It is right renal mass proven to be benign, he certainly may benefit from renal mass biopsy on the left side prior to any intervention.    3. Prostate cancer: His PSA will be checked today. He will continue surveillance every 6 months.    4. Bladder cancer: He will undergo surveillance cystoscopy at the time of his upcoming partial nephrectomy. If there is a recurrent bladder tumor, he understands that we would proceed with biopsy and/or transurethral resection as needed. We reviewed  that possibility today as well as the potential risks and benefits, complications, and expected recovery process. He understands that this likely would not delay his partial nephrectomy however.    5. Pulmonary nodule: He is due for repeat imaging of the chest in April 2017 to follow-up on his right lower lobe pulmonary nodule.    6. Thyroid mass: He will continue follow-up with Dr. Benjamine Mola but his biopsy was apparently not suggestive of malignancy.    Cc: Dr. Sharilyn Sites     Verified Results PSA1 [phone] 01:23PM1 Read Drivers  SPECIMEN TYPE: BLOOD  [Feb 16, 2015 12:48PM Mason Scott] Please notify patient of PSA result. PSA is low and indicates that prostate cancer is well controlled.   Test Name Result Flag Reference  PSA1 0.44 ng/mL1  <=4.001  TEST METHODOLOGY: ECLIA PSA (ELECTROCHEMILUMINESCENCE IMMUNOASSAY)     1. Amended By: Raynelle Bring; Feb 16 2015 12:48 PM EST  SignaturesElectronically signed by : Raynelle Bring, M.D.; Feb 16 2015 12:48PM EST

## 2015-02-21 NOTE — Anesthesia Procedure Notes (Signed)

## 2015-02-22 ENCOUNTER — Encounter (HOSPITAL_COMMUNITY): Payer: Self-pay | Admitting: Urology

## 2015-02-22 LAB — BASIC METABOLIC PANEL
Anion gap: 9 (ref 5–15)
BUN: 17 mg/dL (ref 6–20)
CHLORIDE: 105 mmol/L (ref 101–111)
CO2: 22 mmol/L (ref 22–32)
Calcium: 8.2 mg/dL — ABNORMAL LOW (ref 8.9–10.3)
Creatinine, Ser: 1.25 mg/dL — ABNORMAL HIGH (ref 0.61–1.24)
GFR calc non Af Amer: 58 mL/min — ABNORMAL LOW (ref >60)
Glucose, Bld: 176 mg/dL — ABNORMAL HIGH (ref 65–99)
POTASSIUM: 4.3 mmol/L (ref 3.5–5.1)
SODIUM: 136 mmol/L (ref 135–145)

## 2015-02-22 LAB — CREATININE, FLUID (PLEURAL, PERITONEAL, JP DRAINAGE): Creat, Fluid: 1.3 mg/dL

## 2015-02-22 LAB — HEMOGLOBIN AND HEMATOCRIT, BLOOD
HEMATOCRIT: 36.9 % — AB (ref 39.0–52.0)
HEMOGLOBIN: 12.6 g/dL — AB (ref 13.0–17.0)

## 2015-02-22 MED ORDER — HYDROCODONE-ACETAMINOPHEN 5-325 MG PO TABS: 1.0000 | ORAL_TABLET | Freq: Four times a day (QID) | ORAL | Status: DC | PRN

## 2015-02-22 MED ORDER — BISACODYL 10 MG RE SUPP
10.0000 mg | Freq: Once | RECTAL | Status: AC
  Filled 2015-02-22: qty 1

## 2015-02-22 MED ORDER — SENNA 8.6 MG PO TABS: 1.0000 | ORAL_TABLET | Freq: Every day | ORAL | Status: DC

## 2015-02-22 MED ORDER — BISACODYL 10 MG RE SUPP: 10.0000 mg | Freq: Every day | RECTAL | Status: DC | PRN

## 2015-02-22 MED ORDER — HYDROCODONE-ACETAMINOPHEN 5-325 MG PO TABS
1.0000 | ORAL_TABLET | Freq: Four times a day (QID) | ORAL | Status: DC | PRN
  Administered 2015-02-22 – 2015-02-23 (×3): 1 via ORAL
  Filled 2015-02-22 (×3): qty 1

## 2015-02-22 MED ORDER — BISACODYL 10 MG RE SUPP
10.0000 mg | Freq: Once | RECTAL | Status: AC
  Administered 2015-02-22: 10 mg via RECTAL

## 2015-02-22 MED ORDER — DOCUSATE SODIUM 100 MG PO CAPS: 100.0000 mg | ORAL_CAPSULE | Freq: Two times a day (BID) | ORAL | Status: DC

## 2015-02-22 NOTE — Progress Notes (Signed)
RT placed pt on auto titrate CPAP 5-15cmH2O on room air via home nasal pillows. Pt states he is comfortable and is tolerating CPAP well at this time. RT will continue to monitor as needed.

## 2015-02-22 NOTE — Progress Notes (Signed)
1 Day Post-Op Subjective: The patient is doing well.  No nausea or vomiting. Pain is adequately controlled.  Objective: Vital signs in last 24 hours: Temp:  [97.7 F (36.5 C)-98.9 F (37.2 C)] 98.9 F (37.2 C) (08/19 0607) Pulse Rate:  [51-71] 71 (08/19 0607) Resp:  [8-28] 16 (08/19 0607) BP: (122-137)/(61-79) 128/66 mmHg (08/19 0607) SpO2:  [95 %-100 %] 97 % (08/19 0607) Weight:  [100.812 kg (222 lb 4 oz)] 100.812 kg (222 lb 4 oz) (08/18 0853)  Intake/Output from previous day: 08/18 0701 - 08/19 0700 In: 5250 [I.V.:5200; IV Piggyback:50] Out: 1914 [Urine:1340; Drains:100; Blood:350] Intake/Output this shift:    Physical Exam:  General: Alert and oriented. CV: RRR Lungs: Clear bilaterally. GI: Soft, Nondistended. Incisions: Clean and dry. Urine: Clear Extremities: Nontender, no erythema, no edema.  Lab Results:  Recent Labs  02/21/15 1725 02/22/15 0505  HGB 12.4* 12.6*  HCT 36.5* 36.9*          Recent Labs  02/15/15 1100 02/21/15 1725 02/22/15 0505  CREATININE 1.11 1.33* 1.25*           Results for orders placed or performed during the hospital encounter of 02/21/15 (from the past 24 hour(s))  Basic metabolic panel     Status: Abnormal   Collection Time: 02/21/15  5:25 PM  Result Value Ref Range   Sodium 138 135 - 145 mmol/L   Potassium 3.4 (L) 3.5 - 5.1 mmol/L   Chloride 104 101 - 111 mmol/L   CO2 25 22 - 32 mmol/L   Glucose, Bld 161 (H) 65 - 99 mg/dL   BUN 16 6 - 20 mg/dL   Creatinine, Ser 1.33 (H) 0.61 - 1.24 mg/dL   Calcium 8.1 (L) 8.9 - 10.3 mg/dL   GFR calc non Af Amer 54 (L) >60 mL/min   GFR calc Af Amer >60 >60 mL/min   Anion gap 9 5 - 15  Hemoglobin and hematocrit, blood     Status: Abnormal   Collection Time: 02/21/15  5:25 PM  Result Value Ref Range   Hemoglobin 12.4 (L) 13.0 - 17.0 g/dL   HCT 36.5 (L) 39.0 - 78.2 %  Basic metabolic panel     Status: Abnormal   Collection Time: 02/22/15  5:05 AM  Result Value Ref Range   Sodium 136  135 - 145 mmol/L   Potassium 4.3 3.5 - 5.1 mmol/L   Chloride 105 101 - 111 mmol/L   CO2 22 22 - 32 mmol/L   Glucose, Bld 176 (H) 65 - 99 mg/dL   BUN 17 6 - 20 mg/dL   Creatinine, Ser 1.25 (H) 0.61 - 1.24 mg/dL   Calcium 8.2 (L) 8.9 - 10.3 mg/dL   GFR calc non Af Amer 58 (L) >60 mL/min   GFR calc Af Amer >60 >60 mL/min   Anion gap 9 5 - 15  Hemoglobin and hematocrit, blood     Status: Abnormal   Collection Time: 02/22/15  5:05 AM  Result Value Ref Range   Hemoglobin 12.6 (L) 13.0 - 17.0 g/dL   HCT 36.9 (L) 39.0 - 52.0 %    Assessment/Plan: POD# 1 s/p robotic partial nephrectomy. Doing well  1) Ambulate, Incentive spirometry 2) Advance diet as tolerated 3) Transition to oral pain medication 4) Dulcolax suppository 5) D/C urethral catheter 6) will send JP fluid for creatinine later this evening    LOS: 1 day   Mason Scott 02/22/2015, 7:02 AM

## 2015-02-22 NOTE — Discharge Summary (Addendum)
  Date of admission: 02/21/2015  Date of discharge: 02/22/2015  Admission diagnosis: Right renal mass, bladder cancer  Discharge diagnosis: Right renal mass, bladder cancer  History and Physical: For full details, please see admission history and physical. Briefly, Mason Scott is a 69 y.o. year old patient with a history of bladder and prostate cancer.  He was incidentally found to have bilateral renal masses concerning for renal cell carcinoma.   Hospital Course: He was taken to the OR on 02/21/15 and underwent surveillance cystoscopy that was negative for recurrence.  He was then repositioned and underwent a left robotic partial nephrectomy.  He remained hemodynamically stable and was able to begin ambulating on POD # 1.  His diet was advanced and he was transitioned to oral pain medication.  He had return of bowel function.  His drain fluid was sent for creatinine and was consistent with serum and his drain was removed on POD # 2 when he was able to be discharged.  Laboratory values:  Recent Labs  02/21/15 1725 02/22/15 0505  HGB 12.4* 12.6*  HCT 36.5* 36.9*    Recent Labs  02/21/15 1725 02/22/15 0505  CREATININE 1.33* 1.25*    Disposition: Home  Discharge instruction: The patient was instructed to be ambulatory but told to refrain from heavy lifting, strenuous activity, or driving.   Discharge medications:    Medication List    STOP taking these medications        aspirin EC 81 MG tablet     meloxicam 15 MG tablet  Commonly known as:  MOBIC     Omega 3 1000 MG Caps     phenazopyridine 100 MG tablet  Commonly known as:  PYRIDIUM      TAKE these medications        amLODipine 10 MG tablet  Commonly known as:  NORVASC  Take 10 mg by mouth every evening.     HYDROcodone-acetaminophen 5-325 MG per tablet  Commonly known as:  NORCO/VICODIN  Take 1-2 tablets by mouth every 6 (six) hours as needed.     simvastatin 10 MG tablet  Commonly known as:  ZOCOR   Take 5 mg by mouth every evening.              valsartan-hydrochlorothiazide 160-25 MG per tablet  Commonly known as:  DIOVAN-HCT  Take 0.5 tablets by mouth every morning.        Followup:      Follow-up Information    Follow up with Dutch Gray, MD.   Specialty:  Urology   Why:  as scheduled   Contact information:   [place] [address] [phone]

## 2015-02-23 LAB — BASIC METABOLIC PANEL
Anion gap: 3 — ABNORMAL LOW (ref 5–15)
BUN: 14 mg/dL (ref 6–20)
CALCIUM: 8 mg/dL — AB (ref 8.9–10.3)
CHLORIDE: 105 mmol/L (ref 101–111)
CO2: 28 mmol/L (ref 22–32)
CREATININE: 1.25 mg/dL — AB (ref 0.61–1.24)
GFR calc non Af Amer: 58 mL/min — ABNORMAL LOW (ref >60)
Glucose, Bld: 120 mg/dL — ABNORMAL HIGH (ref 65–99)
Potassium: 4 mmol/L (ref 3.5–5.1)
Sodium: 136 mmol/L (ref 135–145)

## 2015-03-20 ENCOUNTER — Other Ambulatory Visit: Payer: Self-pay | Admitting: Urology

## 2015-05-17 ENCOUNTER — Encounter (HOSPITAL_COMMUNITY)
Admission: RE | Admit: 2015-05-17 | Discharge: 2015-05-17 | Disposition: A | Discharge status: Home / Self Care | Payer: Medicare Other | Source: Ambulatory Visit | Attending: Urology | Admitting: Urology

## 2015-05-17 ENCOUNTER — Encounter (HOSPITAL_COMMUNITY): Payer: Self-pay

## 2015-05-17 DIAGNOSIS — Z01812 Encounter for preprocedural laboratory examination: Secondary | ICD-10-CM | POA: Diagnosis present

## 2015-05-17 LAB — CBC
HCT: 39.6 % (ref 39.0–52.0)
Hemoglobin: 13.6 g/dL (ref 13.0–17.0)
MCH: 31.2 pg (ref 26.0–34.0)
MCHC: 34.3 g/dL (ref 30.0–36.0)
MCV: 90.8 fL (ref 78.0–100.0)
PLATELETS: 205 10*3/uL (ref 150–400)
RBC: 4.36 MIL/uL (ref 4.22–5.81)
RDW: 12.6 % (ref 11.5–15.5)
WBC: 7.1 10*3/uL (ref 4.0–10.5)

## 2015-05-17 LAB — BASIC METABOLIC PANEL
Anion gap: 8 (ref 5–15)
BUN: 28 mg/dL — AB (ref 6–20)
CHLORIDE: 105 mmol/L (ref 101–111)
CO2: 26 mmol/L (ref 22–32)
CREATININE: 1.24 mg/dL (ref 0.61–1.24)
Calcium: 8.8 mg/dL — ABNORMAL LOW (ref 8.9–10.3)
GFR calc Af Amer: >60 mL/min (ref >60)
GFR calc non Af Amer: 58 mL/min — ABNORMAL LOW (ref >60)
GLUCOSE: 107 mg/dL — AB (ref 65–99)
Potassium: 3.7 mmol/L (ref 3.5–5.1)
SODIUM: 139 mmol/L (ref 135–145)

## 2015-05-17 NOTE — Progress Notes (Addendum)
Dr Delma Post reviewed H&P and EKG 11/01/2014 / epic. No orders given. Anesthesia to see pt day of surgery.  CXR/epic 11/01/2014

## 2015-05-17 NOTE — Patient Instructions (Signed)
Mason Scott  05/17/2015   Your procedure is scheduled on: Monday May 27, 2015  Report to [facility] Main  Entrance take [place]  elevators to 3rd floor to  [place] at 9:00 AM.  Call this number if you have problems the morning of surgery [phone]   Remember: ONLY 1 PERSON MAY GO WITH YOU TO SHORT STAY TO GET  READY MORNING OF [place].  Do not eat food or drink liquids :After Midnight.               BRING CPAP MASK AND TUBING     Take these medicines the morning of surgery with A SIP OF WATER: NONE                               You may not have any metal on your body including hair pins and              piercings  Do not wear jewelry,  lotions, powders or colognes, deodorant                           Men may shave face and neck.   Do not bring valuables to the hospital. [place].  Contacts, dentures or bridgework may not be worn into surgery.  Leave suitcase in the car. After surgery it may be brought to your room.   Special Instructions: FOLLOW SURGEON'S INSTRUCTION IN REGARDS TO BOWEL PROGRAM PRIOR TO SURGERY DATE               Please read over the following fact sheets you were given:INCENTIVE SPIROMETER; BLOOD TRANSFUSION INFORMATION SHEET _____________________________________________________________________             [facility] - Preparing for Surgery Before surgery, you can play an important role.  Because skin is not sterile, your skin needs to be as free of germs as possible.  You can reduce the number of germs on your skin by washing with CHG (chlorahexidine gluconate) soap before surgery.  CHG is an antiseptic cleaner which kills germs and bonds with the skin to continue killing germs even after washing. Please DO NOT use if you have an allergy to CHG or antibacterial soaps.  If your skin becomes reddened/irritated stop using the CHG and inform your nurse when you  arrive at Short Stay. Do not shave (including legs and underarms) for at least 48 hours prior to the first CHG shower.  You may shave your face/neck. Please follow these instructions carefully:  1.  Shower with CHG Soap the night before surgery and the  morning of Surgery.  2.  If you choose to wash your hair, wash your hair first as usual with your  normal  shampoo.  3.  After you shampoo, rinse your hair and body thoroughly to remove the  shampoo.                           4.  Use CHG as you would any other liquid soap.  You can apply chg directly  to the skin and wash  Gently with a scrungie or clean washcloth.  5.  Apply the CHG Soap to your body ONLY FROM THE NECK DOWN.   Do not use on face/ open                           Wound or open sores. Avoid contact with eyes, ears mouth and genitals (private parts).                       Wash face,  Genitals (private parts) with your normal soap.             6.  Wash thoroughly, paying special attention to the area where your surgery  will be performed.  7.  Thoroughly rinse your body with warm water from the neck down.  8.  DO NOT shower/wash with your normal soap after using and rinsing off  the CHG Soap.                9.  Pat yourself dry with a clean towel.            10.  Wear clean pajamas.            11.  Place clean sheets on your bed the night of your first shower and do not  sleep with pets. Day of Surgery : Do not apply any lotions/deodorants the morning of surgery.  Please wear clean clothes to the hospital/surgery center.  FAILURE TO FOLLOW THESE INSTRUCTIONS MAY RESULT IN THE CANCELLATION OF YOUR SURGERY PATIENT SIGNATURE_________________________________  NURSE SIGNATURE__________________________________  ________________________________________________________________________   Adam Phenix  An incentive spirometer is a tool that can help keep your lungs clear and active. This tool measures how  well you are filling your lungs with each breath. Taking long deep breaths may help reverse or decrease the chance of developing breathing (pulmonary) problems (especially infection) following:  A long period of time when you are unable to move or be active. BEFORE THE PROCEDURE   If the spirometer includes an indicator to show your best effort, your nurse or respiratory therapist will set it to a desired goal.  If possible, sit up straight or lean slightly forward. Try not to slouch.  Hold the incentive spirometer in an upright position. INSTRUCTIONS FOR USE   Sit on the edge of your bed if possible, or sit up as far as you can in bed or on a chair.  Hold the incentive spirometer in an upright position.  Breathe out normally.  Place the mouthpiece in your mouth and seal your lips tightly around it.  Breathe in slowly and as deeply as possible, raising the piston or the ball toward the top of the column.  Hold your breath for 3-5 seconds or for as long as possible. Allow the piston or ball to fall to the bottom of the column.  Remove the mouthpiece from your mouth and breathe out normally.  Rest for a few seconds and repeat Steps 1 through 7 at least 10 times every 1-2 hours when you are awake. Take your time and take a few normal breaths between deep breaths.  The spirometer may include an indicator to show your best effort. Use the indicator as a goal to work toward during each repetition.  After each set of 10 deep breaths, practice coughing to be sure your lungs are clear. If you have an incision (the cut made at the time of surgery),  support your incision when coughing by placing a pillow or rolled up towels firmly against it. Once you are able to get out of bed, walk around indoors and cough well. You may stop using the incentive spirometer when instructed by your caregiver.  RISKS AND COMPLICATIONS  Take your time so you do not get dizzy or light-headed.  If you are in  pain, you may need to take or ask for pain medication before doing incentive spirometry. It is harder to take a deep breath if you are having pain. AFTER USE  Rest and breathe slowly and easily.  It can be helpful to keep track of a log of your progress. Your caregiver can provide you with a simple table to help with this. If you are using the spirometer at home, follow these instructions: [place] IF:   You are having difficultly using the spirometer.  You have trouble using the spirometer as often as instructed.  Your pain medication is not giving enough relief while using the spirometer.  You develop fever of 100.5 F (38.1 C) or higher. SEEK IMMEDIATE MEDICAL CARE IF:   You cough up bloody sputum that had not been present before.  You develop fever of 102 F (38.9 C) or greater.  You develop worsening pain at or near the incision site. MAKE SURE YOU:   Understand these instructions.  Will watch your condition.  Will get help right away if you are not doing well or get worse. Document Released: 11/02/2006 Document Revised: 09/14/2011 Document Reviewed: 01/03/2007 ExitCare Patient Information 2014 ExitCare, [place].   ________________________________________________________________________  WHAT IS A BLOOD TRANSFUSION? Blood Transfusion Information  A transfusion is the replacement of blood or some of its parts. Blood is made up of multiple cells which provide different functions.  Red blood cells carry oxygen and are used for blood loss replacement.  White blood cells fight against infection.  Platelets control bleeding.  Plasma helps clot blood.  Other blood products are available for specialized needs, such as hemophilia or other clotting disorders. BEFORE THE TRANSFUSION  Who gives blood for transfusions?   Healthy volunteers who are fully evaluated to make sure their blood is safe. This is blood bank blood. Transfusion therapy is the safest it has  ever been in the practice of medicine. Before blood is taken from a donor, a complete history is taken to make sure that person has no history of diseases nor engages in risky social behavior (examples are intravenous drug use or sexual activity with multiple partners). The donor's travel history is screened to minimize risk of transmitting infections, such as malaria. The donated blood is tested for signs of infectious diseases, such as HIV and hepatitis. The blood is then tested to be sure it is compatible with you in order to minimize the chance of a transfusion reaction. If you or a relative donates blood, this is often done in anticipation of surgery and is not appropriate for emergency situations. It takes many days to process the donated blood. RISKS AND COMPLICATIONS Although transfusion therapy is very safe and saves many lives, the main dangers of transfusion include:   Getting an infectious disease.  Developing a transfusion reaction. This is an allergic reaction to something in the blood you were given. Every precaution is taken to prevent this. The decision to have a blood transfusion has been considered carefully by your caregiver before blood is given. Blood is not given unless the benefits outweigh the risks. AFTER THE TRANSFUSION  Right after receiving a blood transfusion, you will usually feel much better and more energetic. This is especially true if your red blood cells have gotten low (anemic). The transfusion raises the level of the red blood cells which carry oxygen, and this usually causes an energy increase.  The nurse administering the transfusion will monitor you carefully for complications. HOME CARE INSTRUCTIONS  No special instructions are needed after a transfusion. You may find your energy is better. Speak with your caregiver about any limitations on activity for underlying diseases you may have. SEEK MEDICAL CARE IF:   Your condition is not improving after your  transfusion.  You develop redness or irritation at the intravenous (IV) site. SEEK IMMEDIATE MEDICAL CARE IF:  Any of the following symptoms occur over the next 12 hours:  Shaking chills.  You have a temperature by mouth above 102 F (38.9 C), not controlled by medicine.  Chest, back, or muscle pain.  People around you feel you are not acting correctly or are confused.  Shortness of breath or difficulty breathing.  Dizziness and fainting.  You get a rash or develop hives.  You have a decrease in urine output.  Your urine turns a dark color or changes to pink, red, or brown. Any of the following symptoms occur over the next 10 days:  You have a temperature by mouth above 102 F (38.9 C), not controlled by medicine.  Shortness of breath.  Weakness after normal activity.  The white part of the eye turns yellow (jaundice).  You have a decrease in the amount of urine or are urinating less often.  Your urine turns a dark color or changes to pink, red, or brown. Document Released: 06/19/2000 Document Revised: 09/14/2011 Document Reviewed: 02/06/2008 [facility] Patient Information 2014 [place], [place].  _______________________________________________________________________

## 2015-05-24 NOTE — H&P (Signed)
History of Present Illness [name] is a 68 year old with the following urologic history:    1) Prostate cancer: He was diagnosed with cT1cNx Mx, Gleason 3+3=6 adenocarcinoma of the prostate (pretreatment PSA 6.14) in November 2013 and is s/p treatment with brachytherapy (Dr. Robert Murray) on 09/29/12.  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 He was noted to have a recurrent, high grade Ta urothelial carcinoma in 2015.    Sep 1991: TURBT, Thiotepa  Mar 2015: Erythematous changes and atypical cytology with positive FISH  Apr 2015: TURBT: High grade, Ta urothelial carcinoma of left lateral bladder  Jun-Jul 2015: Induction BCG  Sep 2015: TURBT: Suspicious cytology, Low grade Ta tumor of dome of the bladder  Sep-Oct 2015: 3 week maintenance BCG (rash developed ? related to BCG)  Jan 2016: Cytology suspicious, Upper tract imaging (CT negative), cysto negative  Feb 2016: 3 week maintenance BCG  May 2016: TUR - L posterior high grade Ta tumor, R lateral low grade Ta tumor    3) Incontinence: He presented with new onset incontinence in July 2014 associated with urgency. This resolved spontaneously without the need for treatment.    4) Bilateral renal masses: He was incidentally noted to have bilateral enhancing renal masses on CT imaging during an upper tract study for his urothelial carcinoma. An MRI confirmed enhancing renal masses including a 1.8 cm posterior lower pole right renal mass and a 2.1 cm interpolar left renal mass off the posterior hilar lip. 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    Baseline renal function: Cr 1.1, eGFR > 60 ml/min    Aug 2016: Right partial nephrectomy - pT1a Nx Mx, Fuhrman grade 3 type II papillary renal cell  carcinoma with negative surgical margins    Interval history:    He follows up today in preparation for treatment of his left enhancing renal mass. He has remained in excellent health since his last visit. He denies any hematuria or other voiding complaints.     Past Medical History Problems  1. History of Arthritis 2. History of hyperlipidemia (Z86.39) 3. History of hypertension (Z86.79) 4. History of sleep apnea (Z87.09)  Surgical History Problems  1. History of Cystoscopy Bladder Tumor 2. History of Cystoscopy With Biopsy 3. History of Cystoscopy With Biopsy 4. History of Cystoscopy With Fulguration Medium Lesion (2-5cm) 5. History of Kidney Surgery Laparoscopic Partial Nephrectomy 6. History of Surgery Prostate Transperineal Placement Of Needles  Current Meds 1. AmLODIPine Besylate 10 MG Oral Tablet;  Therapy: (Recorded:03Jan2014) to Recorded 2. Aspir-81 81 MG Oral Tablet Delayed Release;  Therapy: (Recorded:03Jan2014) to Recorded 3. CPAP;  Therapy: (Recorded:03Jan2014) to Recorded 4. Fish Oil Concentrate 1000 MG Oral Capsule;  Therapy: (Recorded:03Jan2014) to Recorded 5. Meloxicam 15 MG Oral Tablet; takes 1/2 qd;  Therapy: 21Jan2014 to Recorded 6. Valsartan-Hydrochlorothiazide 160-25 MG Oral Tablet;  Therapy: (Recorded:06May2015) to Recorded  Allergies Medication  1. No Known Drug Allergies  Family History Problems  1. Family history of Breast Cancer : Mother 2. Denied: Family history of Prostate Cancer 3. Family history of Stroke Syndrome : Father  Social History Problems  1. Denied: History of Alcohol Use (History) 2. Marital History - Currently Married 3. Never A Smoker  Review of Systems  Genitourinary: no hematuria.  Constitutional:   no recent weight loss.    Vitals Vital Signs [Data Includes: Last 1 Day]  Recorded: [id number] 11:00AM  Blood Pressure: 158 / 78 Heart Rate: 51  Physical Exam Constitutional: Well nourished and well developed .  No acute distress.  ENT:. The ears and nose are normal in appearance.  Neck: The appearance of the neck is normal and no neck mass is present.  Pulmonary: No respiratory distress, normal respiratory rhythm and effort and clear bilateral breath sounds.  Cardiovascular: Heart rate and rhythm are normal . No peripheral edema.  Abdomen: Incision site(s) well healed. The abdomen is soft and nontender. No masses are palpated. No CVA tenderness. No hernias are palpable. No hepatosplenomegaly noted.  Lymphatics: The supraclavicular, femoral and inguinal nodes are not enlarged or tender.  Skin: Normal skin turgor, no visible rash and no visible skin lesions.  Neuro/Psych:. Mood and affect are appropriate.    Results/Data Selected Results  UA With REFLEX [id number] 10:14AM Mason Scott  SPECIMEN TYPE: CLEAN CATCH   Test Name Result Flag Reference  COLOR YELLOW  YELLOW  ** PLEASE NOTE CHANGE IN UNIT OF MEASURE AND REFERENCE RANGE(S). **  APPEARANCE CLEAR  CLEAR  SPECIFIC GRAVITY 1.015  1.001-1.035  pH 6.0  5.0-8.0  GLUCOSE NEGATIVE  NEGATIVE  BILIRUBIN NEGATIVE  NEGATIVE  KETONE NEGATIVE  NEGATIVE  BLOOD TRACE A NEGATIVE  PROTEIN NEGATIVE  NEGATIVE  NITRITE NEGATIVE  NEGATIVE  LEUKOCYTE ESTERASE NEGATIVE  NEGATIVE  SQUAMOUS EPITHELIAL/HPF 0-5 HPF  <=5  WBC NONE SEEN WBC/HPF  <=5  RBC 0-2 RBC/HPF  <=2  BACTERIA NONE SEEN HPF  NONE SEEN  CRYSTALS NONE SEEN HPF  NONE SEEN  CASTS NONE SEEN LPF  NONE SEEN  Yeast NONE SEEN HPF  NONE SEEN   Assessment Assessed  1. 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 Renal cell carcinoma, right (C64.1) 3. Malignant neoplasm of overlapping sites of bladder (C67.8) 4. Prostate cancer (C61)  Plan Malignant neoplasm of overlapping sites of bladder  1. Cysto; Status:Canceled;  2. Follow-up Month x 3 Office  Follow-up  Status: Canceled Prostate cancer  3. PSA; Status:Canceled;  Renal cell carcinoma, left  4. Follow-up Keep Future Appt Office  Follow-up   Status: Hold For - Appointment  Requested  for: [id number]  Discussion/Summary 1. Left renal mass concerning for malignancy: I reviewed the patient's MRI with him and his wife today. He has elected to proceed with surgical resection considering the fact that he was noted to have an aggressive primary renal malignancy on the right kidney confirmed after his right partial nephrectomy. We did discuss the possibility that this procedure may be more complex based on the location of this tumor as well as the fact that he had very densely adherent fat to the kidney which made his last procedure more complicated despite the favorable location of the tumor. He understands that there may be an increased risk for necessitating open surgical conversion or total nephrectomy if indicated. He is prepared for this and does wish to proceed as planned with a left robot-assisted laparoscopic partial nephrectomy.   The patient was provided information regarding their renal mass including the relative risk of benign versus malignant pathology and the natural history of renal cell carcinoma and other possible malignancies of the kidney. The role of renal biopsy, laboratory testing, and imaging studies to further characterize renal masses and/or the presence of metastatic disease were explained. We discussed the role of active surveillance, surgical therapy with both radical nephrectomy and nephron-sparing surgery,  and ablative therapy in the treatment of renal masses. In addition, we discussed our goals of providing an accurate diagnosis and oncologic control while maintaining optimal renal function as appropriate based on the size, location, and complexity of their renal mass as well as their co-morbidities.    We have discussed the risks of treatment in detail including but not limited to bleeding, infection, heart attack, stroke, death, venothromoboembolism, cancer recurrence, injury/damage to surrounding organs and structures,  urine leak, the possibility of open surgical conversion for patients undergoing minimally invasive surgery, the risk of developing chronic kidney disease and its associated implications, and the potential risk of end stage renal disease possibly necessitating dialysis.     2. Renal cell carcinoma: He will likely be due to begin surveillance imaging in the spring. Considering the aggressive nature of his tumor on the right side and pending his pathology from his tumor on the left side, we will discuss our plan for surveillance including the possibility of more frequent renal imaging if he does have multi-focal renal cell carcinoma.    3. Prostate cancer: His next PSA is due for around February 2017.    4. Urothelial carcinoma of the bladder: He will plan to undergo surveillance cystoscopy of the time of his partial nephrectomy. If negative, he would be due for maintenance BCG to begin in early January.    5. Pulmonary nodule: He is due for repeat imaging of the chest around April 2017. This will be scheduled to coincide with his need for follow-up surveillance of his renal cell carcinoma anyway.    Cc: Dr. Sharilyn Sites  A total of 35 minutes were spent in the overall care of the patient today with 30 minutes in direct face to face consultation.    Signatures Electronically signed by : Mason Scott, M.D.; May 14 2015 12:23PM EST

## 2015-05-27 ENCOUNTER — Inpatient Hospital Stay (HOSPITAL_COMMUNITY)
Admission: RE | Admit: 2015-05-27 | Discharge: 2015-05-29 | DRG: 658 | Disposition: A | Discharge status: Home / Self Care | Payer: Medicare Other | Source: Ambulatory Visit | Attending: Urology | Admitting: Urology

## 2015-05-27 ENCOUNTER — Encounter (HOSPITAL_COMMUNITY)
Admission: RE | Disposition: A | Discharge status: Home / Self Care | Payer: Self-pay | Source: Ambulatory Visit | Attending: Urology

## 2015-05-27 ENCOUNTER — Encounter (HOSPITAL_COMMUNITY): Payer: Self-pay

## 2015-05-27 ENCOUNTER — Inpatient Hospital Stay (HOSPITAL_COMMUNITY): Payer: Medicare Other | Admitting: Registered Nurse

## 2015-05-27 DIAGNOSIS — C61 Malignant neoplasm of prostate: Secondary | ICD-10-CM | POA: Diagnosis present

## 2015-05-27 DIAGNOSIS — Z7982 Long term (current) use of aspirin: Secondary | ICD-10-CM

## 2015-05-27 DIAGNOSIS — G4730 Sleep apnea, unspecified: Secondary | ICD-10-CM | POA: Diagnosis present

## 2015-05-27 DIAGNOSIS — Z905 Acquired absence of kidney: Secondary | ICD-10-CM | POA: Diagnosis not present

## 2015-05-27 DIAGNOSIS — E785 Hyperlipidemia, unspecified: Secondary | ICD-10-CM | POA: Diagnosis present

## 2015-05-27 DIAGNOSIS — C642 Malignant neoplasm of left kidney, except renal pelvis: Secondary | ICD-10-CM | POA: Diagnosis present

## 2015-05-27 DIAGNOSIS — Z8551 Personal history of malignant neoplasm of bladder: Secondary | ICD-10-CM

## 2015-05-27 DIAGNOSIS — D49519 Neoplasm of unspecified behavior of unspecified kidney: Secondary | ICD-10-CM | POA: Diagnosis present

## 2015-05-27 DIAGNOSIS — I10 Essential (primary) hypertension: Secondary | ICD-10-CM | POA: Diagnosis present

## 2015-05-27 DIAGNOSIS — M1990 Unspecified osteoarthritis, unspecified site: Secondary | ICD-10-CM | POA: Diagnosis present

## 2015-05-27 HISTORY — PX: CYSTOSCOPY: SHX5120

## 2015-05-27 HISTORY — PX: ROBOTIC ASSITED PARTIAL NEPHRECTOMY: SHX6087

## 2015-05-27 LAB — BASIC METABOLIC PANEL
ANION GAP: 10 (ref 5–15)
BUN: 17 mg/dL (ref 6–20)
CALCIUM: 8.4 mg/dL — AB (ref 8.9–10.3)
CHLORIDE: 100 mmol/L — AB (ref 101–111)
CO2: 28 mmol/L (ref 22–32)
Creatinine, Ser: 1.59 mg/dL — ABNORMAL HIGH (ref 0.61–1.24)
GFR calc non Af Amer: 43 mL/min — ABNORMAL LOW (ref >60)
GFR, EST AFRICAN AMERICAN: 50 mL/min — AB (ref >60)
Glucose, Bld: 156 mg/dL — ABNORMAL HIGH (ref 65–99)
Potassium: 3.7 mmol/L (ref 3.5–5.1)
Sodium: 138 mmol/L (ref 135–145)

## 2015-05-27 LAB — TYPE AND SCREEN
ABO/RH(D): A POS
Antibody Screen: NEGATIVE

## 2015-05-27 LAB — HEMOGLOBIN AND HEMATOCRIT, BLOOD
HEMATOCRIT: 41 % (ref 39.0–52.0)
Hemoglobin: 13.8 g/dL (ref 13.0–17.0)

## 2015-05-27 SURGERY — ROBOTIC ASSITED PARTIAL NEPHRECTOMY
Anesthesia: General

## 2015-05-27 MED ORDER — LIDOCAINE HCL (CARDIAC) 20 MG/ML IV SOLN
INTRAVENOUS | Status: AC
  Filled 2015-05-27: qty 5

## 2015-05-27 MED ORDER — SIMVASTATIN 10 MG PO TABS
5.0000 mg | ORAL_TABLET | Freq: Every evening | ORAL | Status: DC
  Administered 2015-05-27 – 2015-05-28 (×2): 5 mg via ORAL
  Filled 2015-05-27 (×2): qty 1

## 2015-05-27 MED ORDER — LACTATED RINGERS IR SOLN
Status: DC | PRN
  Administered 2015-05-27: 1000 mL

## 2015-05-27 MED ORDER — SODIUM CHLORIDE 0.9 % IJ SOLN
INTRAMUSCULAR | Status: AC
  Filled 2015-05-27: qty 20

## 2015-05-27 MED ORDER — HEMOSTATIC AGENTS (NO CHARGE) OPTIME
TOPICAL | Status: DC | PRN
  Administered 2015-05-27: 1 via TOPICAL

## 2015-05-27 MED ORDER — MIDAZOLAM HCL 5 MG/5ML IJ SOLN
INTRAMUSCULAR | Status: DC | PRN
  Administered 2015-05-27: 2 mg via INTRAVENOUS

## 2015-05-27 MED ORDER — DIPHENHYDRAMINE HCL 50 MG/ML IJ SOLN: 12.5000 mg | Freq: Four times a day (QID) | INTRAMUSCULAR | Status: DC | PRN

## 2015-05-27 MED ORDER — MORPHINE SULFATE (PF) 2 MG/ML IV SOLN
2.0000 mg | INTRAVENOUS | Status: DC | PRN
  Administered 2015-05-27 – 2015-05-28 (×2): 2 mg via INTRAVENOUS
  Filled 2015-05-27 (×2): qty 1

## 2015-05-27 MED ORDER — HYDROMORPHONE HCL 2 MG/ML IJ SOLN
INTRAMUSCULAR | Status: AC
  Filled 2015-05-27: qty 1

## 2015-05-27 MED ORDER — DIPHENHYDRAMINE HCL 12.5 MG/5ML PO ELIX: 12.5000 mg | ORAL_SOLUTION | Freq: Four times a day (QID) | ORAL | Status: DC | PRN

## 2015-05-27 MED ORDER — HYDROMORPHONE HCL 1 MG/ML IJ SOLN
INTRAMUSCULAR | Status: AC
  Filled 2015-05-27: qty 1

## 2015-05-27 MED ORDER — SODIUM CHLORIDE 0.9 % IJ SOLN
INTRAMUSCULAR | Status: DC | PRN
  Administered 2015-05-27: 20 mL

## 2015-05-27 MED ORDER — SODIUM CHLORIDE 0.9 % IR SOLN: Status: DC | PRN

## 2015-05-27 MED ORDER — ROCURONIUM BROMIDE 100 MG/10ML IV SOLN
INTRAVENOUS | Status: AC
  Filled 2015-05-27: qty 1

## 2015-05-27 MED ORDER — GLYCOPYRROLATE 0.2 MG/ML IJ SOLN
INTRAMUSCULAR | Status: AC
  Filled 2015-05-27: qty 4

## 2015-05-27 MED ORDER — ACETAMINOPHEN 10 MG/ML IV SOLN
1000.0000 mg | Freq: Four times a day (QID) | INTRAVENOUS | Status: DC
  Administered 2015-05-27 – 2015-05-28 (×3): 1000 mg via INTRAVENOUS
  Filled 2015-05-27 (×4): qty 100

## 2015-05-27 MED ORDER — PROMETHAZINE HCL 25 MG/ML IJ SOLN: 6.2500 mg | INTRAMUSCULAR | Status: DC | PRN

## 2015-05-27 MED ORDER — HYDROCODONE-ACETAMINOPHEN 5-325 MG PO TABS: 1.0000 | ORAL_TABLET | Freq: Four times a day (QID) | ORAL | Status: DC | PRN

## 2015-05-27 MED ORDER — LACTATED RINGERS IV SOLN
INTRAVENOUS | Status: DC
  Administered 2015-05-27: 1000 mL via INTRAVENOUS

## 2015-05-27 MED ORDER — SUCCINYLCHOLINE CHLORIDE 20 MG/ML IJ SOLN
INTRAMUSCULAR | Status: DC | PRN
  Administered 2015-05-27: 100 mg via INTRAVENOUS
  Administered 2015-05-27: 80 mg via INTRAVENOUS

## 2015-05-27 MED ORDER — CEFAZOLIN SODIUM 1-5 GM-% IV SOLN
1.0000 g | Freq: Three times a day (TID) | INTRAVENOUS | Status: AC
  Administered 2015-05-27 – 2015-05-28 (×2): 1 g via INTRAVENOUS
  Filled 2015-05-27 (×2): qty 50

## 2015-05-27 MED ORDER — IRBESARTAN 150 MG PO TABS
150.0000 mg | ORAL_TABLET | Freq: Every day | ORAL | Status: DC
  Administered 2015-05-27 – 2015-05-29 (×3): 150 mg via ORAL
  Filled 2015-05-27 (×3): qty 1

## 2015-05-27 MED ORDER — FENTANYL CITRATE (PF) 100 MCG/2ML IJ SOLN
INTRAMUSCULAR | Status: DC | PRN
  Administered 2015-05-27 (×2): 50 ug via INTRAVENOUS
  Administered 2015-05-27: 100 ug via INTRAVENOUS
  Administered 2015-05-27: 50 ug via INTRAVENOUS

## 2015-05-27 MED ORDER — DEXTROSE-NACL 5-0.45 % IV SOLN
INTRAVENOUS | Status: DC
  Administered 2015-05-27 – 2015-05-28 (×4): via INTRAVENOUS

## 2015-05-27 MED ORDER — MANNITOL 25 % IV SOLN
INTRAVENOUS | Status: AC
  Filled 2015-05-27: qty 100

## 2015-05-27 MED ORDER — ONDANSETRON HCL 4 MG/2ML IJ SOLN
INTRAMUSCULAR | Status: AC
  Filled 2015-05-27: qty 2

## 2015-05-27 MED ORDER — HYDROMORPHONE HCL 1 MG/ML IJ SOLN
0.2500 mg | INTRAMUSCULAR | Status: DC | PRN
  Administered 2015-05-27 (×2): 0.5 mg via INTRAVENOUS

## 2015-05-27 MED ORDER — LIDOCAINE HCL (CARDIAC) 20 MG/ML IV SOLN
INTRAVENOUS | Status: DC | PRN
  Administered 2015-05-27: 100 mg via INTRAVENOUS

## 2015-05-27 MED ORDER — ROCURONIUM BROMIDE 100 MG/10ML IV SOLN
INTRAVENOUS | Status: DC | PRN
  Administered 2015-05-27: 20 mg via INTRAVENOUS
  Administered 2015-05-27: 10 mg via INTRAVENOUS
  Administered 2015-05-27: 50 mg via INTRAVENOUS
  Administered 2015-05-27: 10 mg via INTRAVENOUS

## 2015-05-27 MED ORDER — HYDROMORPHONE HCL 1 MG/ML IJ SOLN
INTRAMUSCULAR | Status: DC | PRN
  Administered 2015-05-27 (×2): 0.5 mg via INTRAVENOUS

## 2015-05-27 MED ORDER — MIDAZOLAM HCL 2 MG/2ML IJ SOLN
INTRAMUSCULAR | Status: AC
  Filled 2015-05-27: qty 2

## 2015-05-27 MED ORDER — DOCUSATE SODIUM 100 MG PO CAPS
100.0000 mg | ORAL_CAPSULE | Freq: Two times a day (BID) | ORAL | Status: DC
  Administered 2015-05-27 – 2015-05-29 (×4): 100 mg via ORAL
  Filled 2015-05-27 (×4): qty 1

## 2015-05-27 MED ORDER — SODIUM CHLORIDE 0.9 % IR SOLN
Status: DC | PRN
  Administered 2015-05-27: 1500 mL

## 2015-05-27 MED ORDER — PROPOFOL 10 MG/ML IV BOLUS
INTRAVENOUS | Status: DC | PRN
  Administered 2015-05-27: 200 mg via INTRAVENOUS

## 2015-05-27 MED ORDER — HYDROCHLOROTHIAZIDE 25 MG PO TABS
25.0000 mg | ORAL_TABLET | Freq: Every day | ORAL | Status: DC
  Administered 2015-05-27 – 2015-05-29 (×3): 25 mg via ORAL
  Filled 2015-05-27 (×3): qty 1

## 2015-05-27 MED ORDER — BUPIVACAINE LIPOSOME 1.3 % IJ SUSP
20.0000 mL | Freq: Once | INTRAMUSCULAR | Status: AC
  Administered 2015-05-27: 20 mL
  Filled 2015-05-27: qty 20

## 2015-05-27 MED ORDER — ONDANSETRON HCL 4 MG/2ML IJ SOLN
INTRAMUSCULAR | Status: DC | PRN
  Administered 2015-05-27: 4 mg via INTRAVENOUS

## 2015-05-27 MED ORDER — CEFAZOLIN SODIUM-DEXTROSE 2-3 GM-% IV SOLR
INTRAVENOUS | Status: AC
  Filled 2015-05-27: qty 50

## 2015-05-27 MED ORDER — PROPOFOL 10 MG/ML IV BOLUS
INTRAVENOUS | Status: AC
  Filled 2015-05-27: qty 20

## 2015-05-27 MED ORDER — FENTANYL CITRATE (PF) 250 MCG/5ML IJ SOLN
INTRAMUSCULAR | Status: AC
  Filled 2015-05-27: qty 5

## 2015-05-27 MED ORDER — MANNITOL 25 % IV SOLN
INTRAVENOUS | Status: AC
  Filled 2015-05-27: qty 50

## 2015-05-27 MED ORDER — CEFAZOLIN SODIUM-DEXTROSE 2-3 GM-% IV SOLR
2.0000 g | INTRAVENOUS | Status: AC
  Administered 2015-05-27: 2 g via INTRAVENOUS

## 2015-05-27 MED ORDER — GLYCOPYRROLATE 0.2 MG/ML IJ SOLN
INTRAMUSCULAR | Status: DC | PRN
  Administered 2015-05-27: .8 mg via INTRAVENOUS

## 2015-05-27 MED ORDER — MANNITOL 25 % IV SOLN
INTRAVENOUS | Status: DC | PRN
  Administered 2015-05-27 (×2): 12.5 g via INTRAVENOUS

## 2015-05-27 MED ORDER — ONDANSETRON HCL 4 MG/2ML IJ SOLN: 4.0000 mg | INTRAMUSCULAR | Status: DC | PRN

## 2015-05-27 MED ORDER — STERILE WATER FOR IRRIGATION IR SOLN
Status: DC | PRN
  Administered 2015-05-27: 1000 mL

## 2015-05-27 MED ORDER — DEXAMETHASONE SODIUM PHOSPHATE 10 MG/ML IJ SOLN
INTRAMUSCULAR | Status: DC | PRN
  Administered 2015-05-27: 10 mg via INTRAVENOUS

## 2015-05-27 MED ORDER — VALSARTAN-HYDROCHLOROTHIAZIDE 160-25 MG PO TABS: 0.5000 | ORAL_TABLET | Freq: Every morning | ORAL | Status: DC

## 2015-05-27 MED ORDER — NEOSTIGMINE METHYLSULFATE 10 MG/10ML IV SOLN
INTRAVENOUS | Status: DC | PRN
  Administered 2015-05-27: 5 mg via INTRAVENOUS

## 2015-05-27 MED ORDER — AMLODIPINE BESYLATE 10 MG PO TABS
10.0000 mg | ORAL_TABLET | Freq: Every evening | ORAL | Status: DC
  Administered 2015-05-27 – 2015-05-28 (×2): 10 mg via ORAL
  Filled 2015-05-27 (×2): qty 1

## 2015-05-27 MED ORDER — DEXAMETHASONE SODIUM PHOSPHATE 10 MG/ML IJ SOLN
INTRAMUSCULAR | Status: AC
  Filled 2015-05-27: qty 1

## 2015-05-27 SURGICAL SUPPLY — 60 items
APL ESCP 34 STRL LF DISP (HEMOSTASIS) ×2
APPLICATOR SURGIFLO ENDO (HEMOSTASIS) ×2 IMPLANT
BAG SPEC RTRVL LRG 6X4 10 (ENDOMECHANICALS) ×2
BAG URO CATCHER STRL LF (DRAPE) ×2 IMPLANT
CABLE HIGH FREQUENCY MONO STRZ (ELECTRODE) ×4 IMPLANT
CATH INTERMIT  6FR 70CM (CATHETERS) ×2 IMPLANT
CHLORAPREP W/TINT 26ML (MISCELLANEOUS) ×4 IMPLANT
CLIP LIGATING HEM O LOK PURPLE (MISCELLANEOUS) ×4 IMPLANT
CLIP LIGATING HEMO O LOK GREEN (MISCELLANEOUS) ×10 IMPLANT
CLOTH BEACON ORANGE TIMEOUT ST (SAFETY) ×2 IMPLANT
CORDS BIPOLAR (ELECTRODE) ×4 IMPLANT
COVER BACK TABLE 60X90IN (DRAPES) ×2 IMPLANT
COVER SURGICAL LIGHT HANDLE (MISCELLANEOUS) ×4 IMPLANT
COVER TIP SHEARS 8 DVNC (MISCELLANEOUS) ×2 IMPLANT
COVER TIP SHEARS 8MM DA VINCI (MISCELLANEOUS) ×2
DECANTER SPIKE VIAL GLASS SM (MISCELLANEOUS) ×2 IMPLANT
DRAIN CHANNEL 15F RND FF 3/16 (WOUND CARE) ×4 IMPLANT
DRAPE INCISE IOBAN 66X45 STRL (DRAPES) ×4 IMPLANT
DRAPE LAPAROSCOPIC ABDOMINAL (DRAPES) IMPLANT
DRAPE SHEET LG 3/4 BI-LAMINATE (DRAPES) ×4 IMPLANT
DRAPE WARM FLUID 44X44 (DRAPE) ×4 IMPLANT
ELECT PENCIL ROCKER SW 15FT (MISCELLANEOUS) ×4 IMPLANT
ELECT REM PT RETURN 9FT ADLT (ELECTROSURGICAL) ×4
ELECTRODE REM PT RTRN 9FT ADLT (ELECTROSURGICAL) ×2 IMPLANT
EVACUATOR SILICONE 100CC (DRAIN) ×4 IMPLANT
GLOVE BIO SURGEON STRL SZ 6.5 (GLOVE) ×5 IMPLANT
GLOVE BIO SURGEONS STRL SZ 6.5 (GLOVE) ×3
GLOVE BIOGEL M STRL SZ7.5 (GLOVE) ×8 IMPLANT
GOWN STRL REUS W/TWL LRG LVL3 (GOWN DISPOSABLE) ×16 IMPLANT
GUIDEWIRE STR DUAL SENSOR (WIRE) ×2 IMPLANT
HEMOSTAT SURGICEL 4X8 (HEMOSTASIS) IMPLANT
KIT ACCESSORY DA VINCI DISP (KITS) ×2
KIT ACCESSORY DVNC DISP (KITS) ×2 IMPLANT
KIT BASIN OR (CUSTOM PROCEDURE TRAY) ×4 IMPLANT
LIQUID BAND (GAUZE/BANDAGES/DRESSINGS) ×6 IMPLANT
MANIFOLD NEPTUNE II (INSTRUMENTS) ×4 IMPLANT
PACK CYSTO (CUSTOM PROCEDURE TRAY) ×2 IMPLANT
POSITIONER SURGICAL ARM (MISCELLANEOUS) ×6 IMPLANT
POUCH SPECIMEN RETRIEVAL 10MM (ENDOMECHANICALS) ×4 IMPLANT
SET TUBE IRRIG SUCTION NO TIP (IRRIGATION / IRRIGATOR) ×4 IMPLANT
SOLUTION ELECTROLUBE (MISCELLANEOUS) ×4 IMPLANT
SURGIFLO W/THROMBIN 8M KIT (HEMOSTASIS) ×4 IMPLANT
SUT ETHILON 3 0 PS 1 (SUTURE) ×4 IMPLANT
SUT MNCRL AB 4-0 PS2 18 (SUTURE) ×8 IMPLANT
SUT V-LOC BARB 180 2/0GR6 GS22 (SUTURE) ×4
SUT VIC AB 0 CT1 27 (SUTURE) ×8
SUT VIC AB 0 CT1 27XBRD ANTBC (SUTURE) ×2 IMPLANT
SUT VICRYL 0 UR6 27IN ABS (SUTURE) ×8 IMPLANT
SUT VLOC BARB 180 ABS3/0GR12 (SUTURE) ×8
SUTURE V-LC BRB 180 2/0GR6GS22 (SUTURE) ×2 IMPLANT
SUTURE VLOC BRB 180 ABS3/0GR12 (SUTURE) ×2 IMPLANT
TOWEL OR 17X26 10 PK STRL BLUE (TOWEL DISPOSABLE) ×8 IMPLANT
TRAY FOLEY W/METER SILVER 16FR (SET/KITS/TRAYS/PACK) ×4 IMPLANT
TRAY LAPAROSCOPIC (CUSTOM PROCEDURE TRAY) ×4 IMPLANT
TROCAR BLADELESS OPT 5 100 (ENDOMECHANICALS) ×2 IMPLANT
TROCAR UNIVERSAL OPT 12M 100M (ENDOMECHANICALS) ×4 IMPLANT
TROCAR XCEL 12X100 BLDLESS (ENDOMECHANICALS) ×4 IMPLANT
TUBING CONNECTING 10 (TUBING) IMPLANT
TUBING CONNECTING 10' (TUBING)
WATER STERILE IRR 1500ML POUR (IV SOLUTION) ×6 IMPLANT

## 2015-05-27 NOTE — Progress Notes (Signed)
CPAP mask and hose in car

## 2015-05-27 NOTE — Progress Notes (Signed)
B met and Hgb. And Hct. Drawn by lab. 

## 2015-05-27 NOTE — Anesthesia Preprocedure Evaluation (Addendum)
Anesthesia Evaluation  Patient identified by MRN, date of birth, ID band Patient awake    Reviewed: Allergy & Precautions, NPO status , Patient's Chart, lab work & pertinent test results  Airway Mallampati: II  TM Distance: >3 FB Neck ROM: Full    Dental no notable dental hx.    Pulmonary sleep apnea and Continuous Positive Airway Pressure Ventilation ,    Pulmonary exam normal breath sounds clear to auscultation       Cardiovascular hypertension, Pt. on medications Normal cardiovascular exam Rhythm:Regular Rate:Normal     Neuro/Psych negative neurological ROS  negative psych ROS   GI/Hepatic negative GI ROS, Neg liver ROS,   Endo/Other  negative endocrine ROS  Renal/GU negative Renal ROS  negative genitourinary   Musculoskeletal negative musculoskeletal ROS (+)   Abdominal   Peds negative pediatric ROS (+)  Hematology negative hematology ROS (+)   Anesthesia Other Findings   Reproductive/Obstetrics negative OB ROS                            Anesthesia Physical Anesthesia Plan  ASA: II  Anesthesia Plan: General   Post-op Pain Management:    Induction: Intravenous  Airway Management Planned: Oral ETT  Additional Equipment:   Intra-op Plan:   Post-operative Plan: Extubation in OR  Informed Consent: I have reviewed the patients History and Physical, chart, labs and discussed the procedure including the risks, benefits and alternatives for the proposed anesthesia with the patient or authorized representative who has indicated his/her understanding and acceptance.   Dental advisory given  Plan Discussed with: CRNA and Surgeon  Anesthesia Plan Comments:         Anesthesia Quick Evaluation

## 2015-05-27 NOTE — Progress Notes (Signed)
Patient ID: Mason Scott, male   DOB: May 20, 1946, 69 y.o.   MRN: [id number]  Post-op note  Subjective: The patient is doing well.  No complaints.  Objective: Vital signs in last 24 hours: Temp:  [97.9 F (36.6 C)-98.6 F (37 C)] 98.6 F (37 C) (11/21 1723) Pulse Rate:  [57-81] 64 (11/21 1723) Resp:  [9-18] 12 (11/21 1723) BP: (122-151)/(67-112) 138/67 mmHg (11/21 1723) SpO2:  [97 %-100 %] 97 % (11/21 1723) Weight:  [96.707 kg (213 lb 3.2 oz)-101.152 kg (223 lb)] 96.707 kg (213 lb 3.2 oz) (11/21 1725)  Intake/Output from previous day:   Intake/Output this shift: Total I/O In: 2300 [I.V.:2300] Out: 520 [Urine:330; Drains:90; Blood:100]  Physical Exam:  General: Alert and oriented. Abdomen: Soft, Nondistended. Incisions: Clean and dry.  Lab Results:  Recent Labs  05/27/15 1610  HGB 13.8  HCT 41.0    Assessment/Plan: POD#0   1) Continue to monitor, bedrest tonight   Pryor Curia. MD   LOS: 0 days   Mason Scott,LES 05/27/2015, 5:55 PM

## 2015-05-27 NOTE — Discharge Instructions (Signed)
1.  Activity:  You are encouraged to ambulate frequently (about every hour during waking hours) to help prevent blood clots from forming in your legs or lungs.  However, you should not engage in any heavy lifting (> 10-15 lbs), strenuous activity, or straining. 2. Diet: You should advance your diet as instructed by your physician.  It will be normal to have some bloating, nausea, and abdominal discomfort intermittently. 3. Prescriptions:  You will be provided a prescription for pain medication to take as needed.  If your pain is not severe enough to require the prescription pain medication, you may take extra strength Tylenol instead which will have less side effects.  You should also take a prescribed stool softener to avoid straining with bowel movements as the prescription pain medication may constipate you. 4. Incisions: You may remove your dressing bandages 48 hours after surgery if not removed in the hospital.  You will either have some small staples or special tissue glue at each of the incision sites. Once the bandages are removed (if present), the incisions may stay open to air.  You may start showering (but not soaking or bathing in water) the 2nd day after surgery and the incisions simply need to be patted dry after the shower.  No additional care is needed. 5. What to call us about: You should call the office [phone]) if you develop fever > 101 or develop persistent vomiting.  You may resume aspirin, vitamins, supplements, advil, and aleve 7 days after surgery.

## 2015-05-27 NOTE — Interval H&P Note (Signed)
History and Physical Interval Note:  05/27/2015 11:00 AM  Mason Scott  has presented today for surgery, with the diagnosis of LEFT RENAL MASS, BLADDER CANCER  The various methods of treatment have been discussed with the patient and family. After consideration of risks, benefits and other options for treatment, the patient has consented to  Procedure(s): ROBOTIC ASSISTED PARTIAL NEPHRECTOMY (Left) CYSTOSCOPY FLEXIBLE (N/A) as a surgical intervention .  The patient's history has been reviewed, patient examined, no change in status, stable for surgery.  I have reviewed the patient's chart and labs.  Questions were answered to the patient's satisfaction.     Delwyn Scoggin,LES

## 2015-05-27 NOTE — Progress Notes (Signed)
Hgb. 13.8-Hct. 41.0- results noted.

## 2015-05-27 NOTE — Anesthesia Postprocedure Evaluation (Signed)
Anesthesia Post Note  Patient: Mason Scott  Procedure(s) Performed: Procedure(s) (LRB): ROBOTIC ASSISTED PARTIAL NEPHRECTOMY (Left) CYSTOSCOPY FLEXIBLE (N/A)  Patient location during evaluation: PACU Anesthesia Type: General Level of consciousness: awake and alert Pain management: pain level controlled Vital Signs Assessment: post-procedure vital signs reviewed and stable Respiratory status: spontaneous breathing, nonlabored ventilation, respiratory function stable and patient connected to nasal cannula oxygen Cardiovascular status: blood pressure returned to baseline and stable Postop Assessment: No signs of nausea or vomiting Anesthetic complications: no    Last Vitals:  Filed Vitals:   05/27/15 1610 05/27/15 1615  BP:  142/80  Pulse: 71 70  Temp:    Resp: 15 14    Last Pain:  Filed Vitals:   05/27/15 1626  PainSc: 5                  Stirling Orton,W. EDMOND

## 2015-05-27 NOTE — Anesthesia Procedure Notes (Signed)
Procedure Name: Intubation Date/Time: 05/27/2015 11:55 AM Performed by: Carleene Cooper A Pre-anesthesia Checklist: Patient identified, Emergency Drugs available, Suction available, Patient being monitored and Timeout performed Patient Re-evaluated:Patient Re-evaluated prior to inductionOxygen Delivery Method: Circle system utilized Preoxygenation: Pre-oxygenation with 100% oxygen Intubation Type: IV induction Ventilation: Mask ventilation without difficulty and Oral airway inserted - appropriate to patient size Laryngoscope Size: Mac and 4 Grade View: Grade I Tube type: Oral Tube size: 7.5 mm Number of attempts: 1 Airway Equipment and Method: Stylet Placement Confirmation: ETT inserted through vocal cords under direct vision,  positive ETCO2 and breath sounds checked- equal and bilateral Secured at: 22 cm Tube secured with: Tape Dental Injury: Teeth and Oropharynx as per pre-operative assessment

## 2015-05-27 NOTE — Transfer of Care (Signed)
Immediate Anesthesia Transfer of Care Note  Patient: Mason Scott  Procedure(s) Performed: Procedure(s): ROBOTIC ASSISTED PARTIAL NEPHRECTOMY (Left) CYSTOSCOPY FLEXIBLE (N/A)  Patient Location: PACU  Anesthesia Type:General  Level of Consciousness: awake, alert , oriented and patient cooperative  Airway & Oxygen Therapy: Patient Spontanous Breathing and Patient connected to face mask oxygen  Post-op Assessment: Report given to RN, Post -op Vital signs reviewed and stable and Patient moving all extremities  Post vital signs: Reviewed and stable  Last Vitals:  Filed Vitals:   05/27/15 0833  BP: 126/74  Pulse: 57  Temp: 36.6 C  Resp: 18    Complications: No apparent anesthesia complications

## 2015-05-27 NOTE — Op Note (Signed)
Preoperative diagnosis: Left renal mass, urothelial carcinoma of the bladder  Postoperative diagnosis: Left renal mass, urothelial carcinoma of the bladder  Procedure:  1. Left robotic-assisted laparoscopic partial nephrectomy 2. Cystoscopy  Surgeon: Pryor Curia. M.D.  Assistant(s): Debbrah Alar, PA-C  Anesthesia: General  Complications: None  EBL: 100 mL  IVF:  2100 mL crystalloid  Specimens: 1. Left renal mass 2. Bladder washing for cytology  Disposition of specimens: Pathology  Intraoperative findings:       1. Warm renal ischemia time: 27 minutes  Drains: 1. # 15 Blake perinephric drain  Indication:  Mason Scott is a 69 y.o. year old patient with a history of renal cell carcinoma s/p right partial nephrectomy and a left renal mass. He also has a history of bladder cancer and is due for surveillance cystoscopy.  After a thorough review of the management options for their renal mass, they elected to proceed with surgical treatment and the above procedure.  We have discussed the potential benefits and risks of the procedure, side effects of the proposed treatment, the likelihood of the patient achieving the goals of the procedure, and any potential problems that might occur during the procedure or recuperation. Informed consent has been obtained.   Description of procedure:  The patient was taken to the operating room and a general anesthetic was administered. The patient was given preoperative antibiotics and placed supine. After the patient was prepped and draped in the usual sterile fashion, flexible cystourethroscopy was performed.  This revealed a normal urethra. Systematic examination of the bladder revealed no evidence for tumors, stones, or other mucosal pathology. A saline bladder washing was obtained and sent for cytology. He was then placed in the left modified flank position with care to pad all potential pressure points, and prepped and draped in  the usual sterile fashion. Next a preoperative timeout was performed.  A site was selected on the left side of the umbilicus for placement of the camera port. This was placed using a standard open Hassan technique which allowed entry into the peritoneal cavity under direct vision and without difficulty. A 12 mm port was placed and a pneumoperitoneum established. The camera was then used to inspect the abdomen and there was no evidence of any intra-abdominal injuries or other abnormalities. The remaining abdominal ports were then placed. 8 mm robotic ports were placed in the left upper quadrant, left lower quadrant, and far left lateral abdominal wall. A 12 mm port was placed in the upper midline for laparoscopic assistance. All ports were placed under direct vision without difficulty. The surgical cart was then docked.   Utilizing the cautery scissors, the white line of Toldt was incised allowing the colon to be mobilized medially and the plane between the mesocolon and the anterior layer of Gerota's fascia to be developed and the kidney to be exposed.  The ureter and gonadal vein were identified inferiorly and the ureter was lifted anteriorly off the psoas muscle.  Dissection proceeded superiorly along the gonadal vein until the renal vein was identified.  The renal hilum was then carefully isolated with a combination of blunt and sharp dissection allowing the renal arterial and venous structures to be separated and isolated in preparation for renal hilar vessel clamping. There was a single main renal artery and vein with an accessory lower pole renal artery.  12.5 g of IV mannitol was then administered.   Attention turned to the kidney and the perinephric fat surrounding the renal mass was  removed and the kidney was mobilized sufficiently for exposure and resection of the renal mass.  Similar to his prior surgery, the perinephric fat was densely adherent to the renal capsule making this portion of the  procedure very tedious and challenging.  The renal mass was eventually able to be identified and isolated at the posterior lip of the renal hilum near the main renal artery.  The surrounding perinephric fat was cleared from the margins of the tumor.  Once the renal mass was properly isolated, preparations were made for resection of the tumor.  Reconstructive sutures were placed into the abdomen for the renorrhaphy portion of the procedure.  The renal arteries were then clamped with bulldog clamps.  The tumor was then excised with cold scissor dissection along with an adequate visible gross margin of normal renal parenchyma. The tumor appeared to be excised without any gross violation of the tumor. The renal collecting system was not entered during removal of the tumor.  A running 3-0 V-lock suture was then brought through the capsule of the kidney and run along the base of the renal defect to provide hemostasis and close any entry into the renal collecting system if present. Weck clips were used to secure this suture outside the renal capsule at the proximal and distal ends. An additional hemostatic agent (Surgiflo) was then placed into the renal defect. A running 2-0 V lock suture was then used to close the renal capsule using a sliding clip technique.  An additional 3-0 V lock suture was also placed to help close the capsule which resulted in adequate compression of the renal defect.    The bulldog clamps were then removed from the renal hilar vessel(s) and an additional 12.5 g of IV mannitol was administered. Total warm renal ischemia time was 27 minutes. The renal tumor resection site was examined. Hemostasis appeared adequate.   The kidney was placed back into its normal anatomic position and covered with perinephric fat as needed.  A # 60 Blake drain was then brought through the lateral lower port site and positioned in the perinephric space.  It was secured to the skin with a nylon suture. The surgical  cart was undocked.  The renal tumor specimen was removed intact within an endopouch retrieval bag via the camera port sites.  The camera port site and the other 12 mm port site were then closed at the fascial level with 0-vicryl suture.  All other laparoscopic/robotic ports were removed under direct vision and the pneumoperitoneum let down with inspection of the operative field performed and hemostasis again confirmed. All incision sites were then injected with local anesthetic and reapproximated at the skin level with 4-0 monocryl subcuticular closures.  Dermabond was applied to the skin.  The patient tolerated the procedure well and without complications.  The patient was able to be extubated and transferred to the recovery unit in satisfactory condition.  Pryor Curia MD

## 2015-05-27 NOTE — Progress Notes (Signed)
B met results noted 

## 2015-05-28 ENCOUNTER — Encounter (HOSPITAL_COMMUNITY): Payer: Self-pay | Admitting: Urology

## 2015-05-28 LAB — BASIC METABOLIC PANEL
ANION GAP: 9 (ref 5–15)
BUN: 21 mg/dL — AB (ref 6–20)
CHLORIDE: 100 mmol/L — AB (ref 101–111)
CO2: 26 mmol/L (ref 22–32)
Calcium: 8.5 mg/dL — ABNORMAL LOW (ref 8.9–10.3)
Creatinine, Ser: 1.52 mg/dL — ABNORMAL HIGH (ref 0.61–1.24)
GFR calc Af Amer: 53 mL/min — ABNORMAL LOW (ref >60)
GFR, EST NON AFRICAN AMERICAN: 46 mL/min — AB (ref >60)
GLUCOSE: 160 mg/dL — AB (ref 65–99)
POTASSIUM: 3.9 mmol/L (ref 3.5–5.1)
Sodium: 135 mmol/L (ref 135–145)

## 2015-05-28 LAB — HEMOGLOBIN AND HEMATOCRIT, BLOOD
HCT: 36.9 % — ABNORMAL LOW (ref 39.0–52.0)
Hemoglobin: 12.6 g/dL — ABNORMAL LOW (ref 13.0–17.0)

## 2015-05-28 MED ORDER — BISACODYL 10 MG RE SUPP
10.0000 mg | Freq: Once | RECTAL | Status: AC
  Administered 2015-05-28: 10 mg via RECTAL
  Filled 2015-05-28: qty 1

## 2015-05-28 MED ORDER — HYDROCODONE-ACETAMINOPHEN 5-325 MG PO TABS
1.0000 | ORAL_TABLET | Freq: Four times a day (QID) | ORAL | Status: DC | PRN
  Administered 2015-05-28: 2 via ORAL
  Administered 2015-05-28: 1 via ORAL
  Administered 2015-05-29: 2 via ORAL
  Administered 2015-05-29: 1 via ORAL
  Filled 2015-05-28: qty 2
  Filled 2015-05-28 (×2): qty 1
  Filled 2015-05-28: qty 2

## 2015-05-28 MED ORDER — HYDROCODONE-ACETAMINOPHEN 5-325 MG PO TABS: 1.0000 | ORAL_TABLET | Freq: Four times a day (QID) | ORAL | Status: DC | PRN

## 2015-05-28 NOTE — Progress Notes (Signed)
Patient ID: Mason Scott, male   DOB: [birth year], 69 y.o.   MRN: [id number]  1 Day Post-Op Subjective: Doing well.  No nausea or vomiting.  Pain well controlled.  Objective: Vital signs in last 24 hours: Temp:  [97.9 F (36.6 C)-98.9 F (37.2 C)] 98.9 F (37.2 C) (11/22 0636) Pulse Rate:  [57-81] 67 (11/22 0636) Resp:  [9-18] 16 (11/22 0636) BP: (122-151)/(65-112) 127/70 mmHg (11/22 0636) SpO2:  [97 %-100 %] 97 % (11/22 0636) Weight:  [96.707 kg (213 lb 3.2 oz)-101.152 kg (223 lb)] 96.707 kg (213 lb 3.2 oz) (11/21 1725)  Intake/Output from previous day: 11/21 0701 - 11/22 0700 In: 3357.1 [I.V.:3187.1; IV Piggyback:150] Out: 2015 [Urine:1780; Drains:135; Blood:100] Intake/Output this shift:    Physical Exam:  General: Alert and oriented CV: RRR Lungs: Clear Abdomen: Soft, ND, Positive BS Incisions:C/D/I Ext: NT, No erythema  Lab Results:  Recent Labs  05/27/15 1610 05/28/15 0526  HGB 13.8 12.6*  HCT 41.0 36.9*   BMET  Recent Labs  05/27/15 1610 05/28/15 0526  NA 138 135  K 3.7 3.9  CL 100* 100*  CO2 28 26  GLUCOSE 156* 160*  BUN 17 21*  CREATININE 1.59* 1.52*  CALCIUM 8.4* 8.5*     Studies/Results: No results found.  Assessment/Plan: POD # 1 s/p left RAL partial nephrectomy - D/C catheter - Ambulate, IS - Monitor drain output and renal function - Advance diet - Po pain medication   LOS: 1 day   Darica Goren,LES 05/28/2015, 7:28 AM

## 2015-05-29 LAB — BASIC METABOLIC PANEL
Anion gap: 8 (ref 5–15)
BUN: 22 mg/dL — ABNORMAL HIGH (ref 6–20)
CHLORIDE: 102 mmol/L (ref 101–111)
CO2: 27 mmol/L (ref 22–32)
Calcium: 8.6 mg/dL — ABNORMAL LOW (ref 8.9–10.3)
Creatinine, Ser: 1.51 mg/dL — ABNORMAL HIGH (ref 0.61–1.24)
GFR calc non Af Amer: 46 mL/min — ABNORMAL LOW (ref >60)
GFR, EST AFRICAN AMERICAN: 53 mL/min — AB (ref >60)
Glucose, Bld: 118 mg/dL — ABNORMAL HIGH (ref 65–99)
POTASSIUM: 3.7 mmol/L (ref 3.5–5.1)
SODIUM: 137 mmol/L (ref 135–145)

## 2015-05-29 LAB — HEMOGLOBIN AND HEMATOCRIT, BLOOD
HEMATOCRIT: 36.7 % — AB (ref 39.0–52.0)
HEMOGLOBIN: 12.4 g/dL — AB (ref 13.0–17.0)

## 2015-05-29 LAB — CREATININE, FLUID (PLEURAL, PERITONEAL, JP DRAINAGE): Creat, Fluid: 1.6 mg/dL

## 2015-05-29 NOTE — Progress Notes (Signed)
Patient discharged home with family. JP drain removed this AM with no complications. Minimal pain & discomfort to surgical area. Tolerating diet well- no N/V. Discharge paperwork explained to patient & he verbalized understanding. Printed prescription given to patient. All questions answered. VS stable. Transported to family's vehicle by W/C with staff assist.

## 2015-05-29 NOTE — Discharge Summary (Signed)
  Date of admission: 05/27/2015  Date of discharge: 05/29/2015  Admission diagnosis: Left renal neoplasm  Discharge diagnosis: RCC  Secondary diagnoses: History of renal cell carcinoma, history of bladder cancer, history of prostate cancer, sleep apnea, hypertension  History and Physical: For full details, please see admission history and physical. Briefly, Mason Scott is a 69 y.o. year old patient with an enhancing left renal mass suspicious for renal malignancy.   Hospital Course: He underwent a left RAL partial nephrectomy and cystoscopy on 05/27/15.  Cystoscopy did not demonstrate evidence of bladder cancer recurrence and his partial nephrectomy was performed without complications.  He remained hemodynamically stable and on POD #1 began ambulating, his diet was advanced, and he was transitioned to oral pain medication.  His renal function remained stable and his drain Cr was 1.3 consistent with serum.  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  Laboratory values:  Recent Labs  05/27/15 1610 05/28/15 0526 05/29/15 0447  HGB 13.8 12.6* 12.4*  HCT 41.0 36.9* 36.7*    Recent Labs  05/28/15 0526 05/29/15 0447  CREATININE 1.52* 1.51*    Disposition: Home  Discharge instruction: The patient was instructed to be ambulatory but told to refrain from heavy lifting, strenuous activity, or driving.   Discharge medications:    Medication List    STOP taking these medications        aspirin EC 81 MG tablet     influenza vac split quadrivalent PF 0.5 ML injection  Commonly known as:  FLUARIX     meloxicam 15 MG tablet  Commonly known as:  MOBIC     omega-3 acid ethyl esters 1 G capsule  Commonly known as:  LOVAZA     pneumococcal 13-valent conjugate vaccine Susp injection  Commonly known as:  PREVNAR 13      TAKE these medications         amLODipine 10 MG tablet  Commonly known as:  NORVASC  Take 10 mg by mouth every evening.     HYDROcodone-acetaminophen 5-325 MG tablet  Commonly known as:  NORCO  Take 1-2 tablets by mouth every 6 (six) hours as needed.     simvastatin 10 MG tablet  Commonly known as:  ZOCOR  Take 5 mg by mouth every evening.     valsartan-hydrochlorothiazide 160-25 MG tablet  Commonly known as:  DIOVAN-HCT  Take 0.5 tablets by mouth every morning.        Followup:      Follow-up Information    Follow up with Dutch Gray, MD On 06/18/2015.   Specialty:  Urology   Why:  at 10:00   Contact information:   [place] [address] [phone]

## 2015-05-29 NOTE — Progress Notes (Signed)
Patient ID: Mason Scott, male   DOB: [birth year], 69 y.o.   MRN: [id number]  2 Days Post-Op Subjective: Doing well.  Had a bowel movement last night which relieved abdominal pressure.  Tolerating diet. Ambulating well.  Objective: Vital signs in last 24 hours: Temp:  [98.1 F (36.7 C)-99.1 F (37.3 C)] 98.4 F (36.9 C) (11/23 0452) Pulse Rate:  [62-74] 70 (11/23 0452) Resp:  [18-20] 18 (11/23 0452) BP: (106-134)/(54-75) 127/70 mmHg (11/23 0452) SpO2:  [94 %-97 %] 94 % (11/23 0452)  Intake/Output from previous day: 11/22 0701 - 11/23 0700 In: 2003.8 [P.O.:960; I.V.:1043.8] Out: 1121 [Urine:1101; Drains:20] Intake/Output this shift:    Physical Exam:  General: Alert and oriented Abdomen: Soft, ND, Positive BS Incisions:C/D/I Ext: NT, No erythema  Lab Results:  Recent Labs  05/27/15 1610 05/28/15 0526 05/29/15 0447  HGB 13.8 12.6* 12.4*  HCT 41.0 36.9* 36.7*   BMET  Recent Labs  05/28/15 0526 05/29/15 0447  NA 135 137  K 3.9 3.7  CL 100* 102  CO2 26 27  GLUCOSE 160* 118*  BUN 21* 22*  CREATININE 1.52* 1.51*  CALCIUM 8.5* 8.6*     Studies/Results: Path: pT1a Nx Mx, papillary RCC with negative surgical margins (Fuhrman grade II)  Drain Cr 1.3  Assessment/Plan: POD # 2 s/p left RAL partial nephrectomy - D/C drain - D/C home   LOS: 2 days   Mason Scott,LES 05/29/2015, 7:32 AM

## 2016-01-08 ENCOUNTER — Other Ambulatory Visit: Payer: Self-pay | Admitting: Urology

## 2016-01-14 NOTE — Patient Instructions (Addendum)
Mason Scott  01/14/2016   Your procedure is scheduled on: Thursday 01/16/2016  Report to [facility] Main  Entrance take [place]  elevators to 3rd floor to  [place] at 0930  AM.  Call this number if you have problems the morning of surgery [phone]   Remember: ONLY 1 PERSON MAY GO WITH YOU TO SHORT STAY TO GET  READY MORNING OF [place].   Do not eat food or drink liquids :After Midnight.   PLEASE BRING YOUR CPAP MASK AND TUBING WITH YOU TO HOSPITAL MORNING OF SURGERY!   Take these medicines the morning of surgery with A SIP OF WATER: none              You may not have any metal on your body including hair pins and              piercings  Do not wear jewelry, make-up, lotions, powders or perfumes, deodorant             Do not wear nail polish.  Do not shave  48 hours prior to surgery.              Men may shave face and neck.   Do not bring valuables to the hospital. [place].  Contacts, dentures or bridgework may not be worn into surgery.  Leave suitcase in the car. After surgery it may be brought to your room.     Patients discharged the day of surgery will not be allowed to drive home.  Name and phone number of your driver:spouse-[name] and son-[name]  Special Instructions: N/A              Please read over the following fact sheets you were given: _____________________________________________________________________             [facility] - Preparing for Surgery Before surgery, you can play an important role.  Because skin is not sterile, your skin needs to be as free of germs as possible.  You can reduce the number of germs on your skin by washing with CHG (chlorahexidine gluconate) soap before surgery.  CHG is an antiseptic cleaner which kills germs and bonds with the skin to continue killing germs even after washing. Please DO NOT use if you have an allergy to CHG or  antibacterial soaps.  If your skin becomes reddened/irritated stop using the CHG and inform your nurse when you arrive at Short Stay. Do not shave (including legs and underarms) for at least 48 hours prior to the first CHG shower.  You may shave your face/neck. Please follow these instructions carefully:  1.  Shower with CHG Soap the night before surgery and the  morning of Surgery.  2.  If you choose to wash your hair, wash your hair first as usual with your  normal  shampoo.  3.  After you shampoo, rinse your hair and body thoroughly to remove the  shampoo.                           4.  Use CHG as you would any other liquid soap.  You can apply chg directly  to the skin and wash  Gently with a scrungie or clean washcloth.  5.  Apply the CHG Soap to your body ONLY FROM THE NECK DOWN.   Do not use on face/ open                           Wound or open sores. Avoid contact with eyes, ears mouth and genitals (private parts).                       Wash face,  Genitals (private parts) with your normal soap.             6.  Wash thoroughly, paying special attention to the area where your surgery  will be performed.  7.  Thoroughly rinse your body with warm water from the neck down.  8.  DO NOT shower/wash with your normal soap after using and rinsing off  the CHG Soap.                9.  Pat yourself dry with a clean towel.            10.  Wear clean pajamas.            11.  Place clean sheets on your bed the night of your first shower and do not  sleep with pets. Day of Surgery : Do not apply any lotions/deodorants the morning of surgery.  Please wear clean clothes to the hospital/surgery center.  FAILURE TO FOLLOW THESE INSTRUCTIONS MAY RESULT IN THE CANCELLATION OF YOUR SURGERY PATIENT SIGNATURE_________________________________  NURSE SIGNATURE__________________________________  ________________________________________________________________________

## 2016-01-15 ENCOUNTER — Encounter (HOSPITAL_COMMUNITY): Payer: Self-pay

## 2016-01-15 ENCOUNTER — Encounter (HOSPITAL_COMMUNITY)
Admission: RE | Admit: 2016-01-15 | Discharge: 2016-01-15 | Disposition: A | Discharge status: Home / Self Care | Payer: Medicare Other | Source: Ambulatory Visit | Attending: Urology | Admitting: Urology

## 2016-01-15 DIAGNOSIS — G4730 Sleep apnea, unspecified: Secondary | ICD-10-CM | POA: Diagnosis not present

## 2016-01-15 DIAGNOSIS — R911 Solitary pulmonary nodule: Secondary | ICD-10-CM | POA: Diagnosis not present

## 2016-01-15 DIAGNOSIS — Z791 Long term (current) use of non-steroidal anti-inflammatories (NSAID): Secondary | ICD-10-CM | POA: Diagnosis not present

## 2016-01-15 DIAGNOSIS — E785 Hyperlipidemia, unspecified: Secondary | ICD-10-CM | POA: Diagnosis not present

## 2016-01-15 DIAGNOSIS — Z7982 Long term (current) use of aspirin: Secondary | ICD-10-CM | POA: Diagnosis not present

## 2016-01-15 DIAGNOSIS — C642 Malignant neoplasm of left kidney, except renal pelvis: Secondary | ICD-10-CM | POA: Diagnosis not present

## 2016-01-15 DIAGNOSIS — C679 Malignant neoplasm of bladder, unspecified: Secondary | ICD-10-CM | POA: Diagnosis present

## 2016-01-15 DIAGNOSIS — Z79899 Other long term (current) drug therapy: Secondary | ICD-10-CM | POA: Diagnosis not present

## 2016-01-15 DIAGNOSIS — C678 Malignant neoplasm of overlapping sites of bladder: Secondary | ICD-10-CM | POA: Diagnosis not present

## 2016-01-15 DIAGNOSIS — E669 Obesity, unspecified: Secondary | ICD-10-CM | POA: Diagnosis not present

## 2016-01-15 DIAGNOSIS — Z6831 Body mass index (BMI) 31.0-31.9, adult: Secondary | ICD-10-CM | POA: Diagnosis not present

## 2016-01-15 DIAGNOSIS — Z905 Acquired absence of kidney: Secondary | ICD-10-CM | POA: Diagnosis not present

## 2016-01-15 DIAGNOSIS — I10 Essential (primary) hypertension: Secondary | ICD-10-CM | POA: Diagnosis not present

## 2016-01-15 DIAGNOSIS — I4510 Unspecified right bundle-branch block: Secondary | ICD-10-CM | POA: Diagnosis not present

## 2016-01-15 DIAGNOSIS — C641 Malignant neoplasm of right kidney, except renal pelvis: Secondary | ICD-10-CM | POA: Diagnosis not present

## 2016-01-15 DIAGNOSIS — Z8546 Personal history of malignant neoplasm of prostate: Secondary | ICD-10-CM | POA: Diagnosis not present

## 2016-01-15 DIAGNOSIS — M1990 Unspecified osteoarthritis, unspecified site: Secondary | ICD-10-CM | POA: Diagnosis not present

## 2016-01-15 LAB — CBC
HCT: 38.7 % — ABNORMAL LOW (ref 39.0–52.0)
Hemoglobin: 13.5 g/dL (ref 13.0–17.0)
MCH: 31.5 pg (ref 26.0–34.0)
MCHC: 34.9 g/dL (ref 30.0–36.0)
MCV: 90.4 fL (ref 78.0–100.0)
PLATELETS: 203 10*3/uL (ref 150–400)
RBC: 4.28 MIL/uL (ref 4.22–5.81)
RDW: 13 % (ref 11.5–15.5)
WBC: 8 10*3/uL (ref 4.0–10.5)

## 2016-01-15 LAB — BASIC METABOLIC PANEL
Anion gap: 7 (ref 5–15)
BUN: 24 mg/dL — AB (ref 6–20)
CO2: 26 mmol/L (ref 22–32)
CREATININE: 1.06 mg/dL (ref 0.61–1.24)
Calcium: 8.7 mg/dL — ABNORMAL LOW (ref 8.9–10.3)
Chloride: 102 mmol/L (ref 101–111)
GFR calc Af Amer: >60 mL/min (ref >60)
Glucose, Bld: 98 mg/dL (ref 65–99)
Potassium: 3.8 mmol/L (ref 3.5–5.1)
SODIUM: 135 mmol/L (ref 135–145)

## 2016-01-15 NOTE — Progress Notes (Signed)
12/18/2015-Ct chest and abdomen without and with contrast from  [facility] 12/18/2015-lab-electrolyte panel, and creatinine and Bun from [facility]

## 2016-01-15 NOTE — H&P (Signed)
@media  print    Office Visit Report 12/25/2015    Mason Scott         MRN: [id number]  PRIMARY CARE:  Sharilyn Sites, MD  DOB: 1945/10/30, 70 year old Male  REFERRING:  Raynelle Bring, MD  SSN: -[id number]  PROVIDER:  Raynelle Bring, M.D.    LOCATION:  [facility] [phone]    CC/HPI: I have kidney cancer.     He follows up today for further evaluation of his bilateral renal cell carcinomas and his right-sided pulmonary nodule. He denies any recent hematuria, unintentional weight loss, or other complaints. He has noted an increasing incisional hernia near his midline incision where his camera port was placed for both his procedures. He denies any significant pain or problems.    CC: Bladder CA cysto f/u  HPI: Mason Scott is a 70 year-old male established patient who is here for cystoscopic surveillance of bladder cancer.  He has not noted recent hematuria.     ALLERGIES: No Allergies    MEDICATIONS: AmLODIPine Besylate 10 MG Oral Tablet Oral  Aspir-81 81 MG Oral Tablet Delayed Release Oral  CPAP  Fish Oil Concentrate 1000 MG Oral Capsule Oral  Meloxicam 15 MG Oral Tablet 0 Oral  Valsartan-Hydrochlorothiazide 160-25 MG Oral Tablet Oral     GU PSH: Cysto Bladder Ureth Biopsy - 11/20/2014, 03/15/2014 Cystoscopy TURBT 2-5 cm - 2015 Lap Partial Nephrectomy - 05/28/2015, 02/26/2015 Locm 300-399Mg/Ml Iodine,1Ml - 12/18/2015 TRANSPERI NEEDLE PLACE, PROS - 2014      PSH Notes: Kidney Surgery Laparoscopic Partial Nephrectomy, Kidney Surgery Laparoscopic Partial Nephrectomy, Cystoscopy With Biopsy, Cystoscopy With Biopsy, Cystoscopy With Fulguration Medium Lesion (2-5cm), Surgery Prostate Transperineal Placement Of Needles, Cystoscopy Bladder Tumor   NON-GU PSH: None         GU PMH: Bladder Cancer, overlapping sites, Malignant neoplasm of overlapping sites of bladder - 08/27/2015 Prostate Cancer, Prostate cancer - 08/27/2015 Kidney Cancer Left, expect renal pelvis,  Renal cell carcinoma, left - 06/10/2015 Kidney Cancer Right, except renal pelvis, Renal cell carcinoma, right - 05/02/2015 Urgency of urination, Urinary urgency - 2015 Urinary Tract Inf, Unspec site, Urinary tract infection - 2015 Male ED, unspecified, Erectile dysfunction - 2015 Urinary incontinence, Unspec, Urinary incontinence - 2014       PMH Notes:  1) Prostate cancer: He was diagnosed with cT1cNx Mx, Gleason 3+3=6 adenocarcinoma of the prostate (pretreatment PSA 6.14) in November 2013 and is s/p treatment with brachytherapy (Dr. Arloa Koh) on 09/29/12.   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 He was noted to have a recurrent, high grade Ta urothelial carcinoma in 2015.   Sep 1991: TURBT, Thiotepa  Mar 2015: Erythematous changes and atypical cytology with positive FISH  Apr 2015: TURBT: High grade, Ta urothelial carcinoma of left lateral bladder  Jun-Jul 2015: Induction BCG  Sep 2015: TURBT: Suspicious cytology, Low grade Ta tumor of dome of the bladder  Sep-Oct 2015: 3 week maintenance BCG (rash developed ? related to BCG)  Jan 2016: Cytology suspicious, Upper tract imaging (CT negative), cysto negative  Feb 2016: 3 week maintenance BCG  May 2016: TUR - L posterior high grade Ta tumor, R lateral low grade Ta tumor  May-Jun 2016: Repeat induction BCG  Jan 2016: Maintenance BCG   3) Incontinence: He presented with new onset incontinence in July  2014 associated with urgency. This resolved spontaneously without the need for treatment.   4) Bilateral renal masses: He was incidentally noted to have bilateral enhancing renal masses on CT imaging during an upper tract study for his urothelial carcinoma. An MRI confirmed enhancing renal masses including a 1.8 cm posterior lower pole right renal mass and a 2.1 cm interpolar left  renal mass off the posterior hilar lip. 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 He underwent a left partial nephrectomy in November 2016 demonstrating a type I papillary renal cell carcinoma.   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 with negative surgical margins   Nov 2016: Left partial nephrectomy - pT1a Nx Mx, Fuhrman grade 2 type I papillary renal cell carcinoma with negative surgical margins      NON-GU PMH: Disorder of thyroid, unspecified, Thyroid mass - 10/10/2014 Other nonspecific abnormal finding of lung field, Lung mass - 09/23/2014 Personal history of other diseases of the circulatory system, History of hypertension - 2014 Personal history of other diseases of the nervous system and sense organs, History of sleep apnea - 2014 Personal history of other endocrine, nutritional and metabolic disease, History of hyperlipidemia - 2014 , Arthritis - 2014     FAMILY HISTORY: Breast Cancer - Mother Stroke Syndrome - Father    SOCIAL HISTORY: Marital Status: Married Patient has never smoked.  Social Drinker.  Drinks 1 caffeinated drink per day.      Notes: Never A Smoker, Marital History - Currently Married, Alcohol Use    REVIEW OF SYSTEMS:     GU Review Male:  Patient denies frequent urination, hard to postpone urination, burning/ pain with urination, get up at night to urinate, leakage of urine, stream starts and stops, trouble starting your streams, and have to strain to urinate .    Gastrointestinal (Upper):  Patient denies nausea and vomiting.    Gastrointestinal (Lower):  Patient denies diarrhea and constipation.    Constitutional:  Patient denies fever, night sweats, weight loss, and fatigue.    Skin:  Patient denies skin rash/ lesion and itching.    Eyes:  Patient denies blurred vision and  double vision.    Ears/ Nose/ Throat:  Patient denies sore throat and sinus problems.    Hematologic/Lymphatic:  Patient denies easy bruising and swollen glands.    Cardiovascular:  Patient denies leg swelling and chest pains.    Respiratory:  Patient denies cough and shortness of breath.    Endocrine:  Patient denies excessive thirst.    Musculoskeletal:  Patient denies back pain and joint pain.    Neurological:  Patient denies headaches and dizziness.    Psychologic:  Patient denies depression and anxiety.    VITAL SIGNS:      Weight: 220 lb/99.8 kg      Height/Length: 70 in / 178 cm      BP: 158/86 mmHg      Pulse: 55 /min      Temp: 98.0 F / 37 C      BMI: 31.6       MULTI-SYSTEM PHYSICAL EXAMINATION:    Constitutional: Well-nourished. No physical deformities. Normally developed. Good grooming.  Neck: Neck symmetrical, not swollen. Normal tracheal position.  Respiratory: No labored breathing, no use of accessory muscles.   Cardiovascular: Normal temperature, normal extremity pulses,  no swelling, no varicosities.  Lymphatic: No enlargement of neck, axillae, groin.  Skin: No paleness, no jaundice, no cyanosis. No lesion, no ulcer, no rash.  Neurologic / Psychiatric: Oriented to time, oriented to place, oriented to person. No depression, no anxiety, no agitation.  Gastrointestinal: No mass, no tenderness, no rigidity, non obese abdomen.  Musculoskeletal: Normal gait and station of head and neck.   PAST DATA REVIEWED:   Source Of History:  Patient  Urine Test Review:  Urinalysis  X-Ray Review: C.T. Chest/ Abd/Pelvis: Reviewed Films.     Notes:  I independently reviewed his CT scan. His pulmonary nodule appears stable. There is no evidence to suggest local recurrence on his kidneys. No evidence to suggest metastatic disease to the abdomen.   PROCEDURES:    Flexible Cystoscopy - 52000  Indication:  Risks, benefits, and potential complications of the procedure were discussed  with the patient including infection, bleeding, voiding discomfort, urinary retention, fever, chills, sepsis, and others. All questions were answered. Informed consent was obtained. Sterile technique and intraurethral analgesia were used.  Meatus:  Normal size. Normal location. Normal condition.  Urethra:  No strictures.  External Sphincter:  Normal.  Verumontanum:  Normal.  Prostate:  He has bilateral obstructingprostatic lobes.  Bladder Neck:  Non-obstructing.  Ureteral Orifices:  Normal location. Normal size. Normal shape. Effluxed clear urine.  Bladder:  No trabeculation. No tumors. There is noted to be an area of erythema to the right posterior bladder which is nonspecific No obvious tumor growths.      Chaperone: The procedure was well-tolerated and without complications. Instructions were given to call the office immediately if questions or problems.    Urinalysis w/Scope - 81001  Dipstick Dipstick Cont'd Micro  Specimen: Voided Bilirubin: Neg WBC/hpf: NS (Not Seen)  Color: Yellow Ketones: Neg RBC/hpf: 3-10/hpf  Appearance: Clear Blood: 1+ Bacteria: NS (Not Seen)  Specific Gravity: 1.025 Protein: Neg Cystals: NS (Not Seen)  pH: 5.5 Urobilinogen: 1.0 Casts: NS (Not Seen)  Glucose: Neg Nitrites: Neg Trichomonas: Not Present   Leukocyte Esterase: Neg Mucous: Not Present    Epithelial Cells: NS (Not Seen)    Yeast: NS (Not Seen)    Sperm: Not Present    ASSESSMENT:     ICD-10 Details  1 GU:  Kidney Cancer Left, expect renal pelvis - C64.2   2  Kidney Cancer Right, except renal pelvis - C64.1   3  Bladder Cancer, overlapping sites - C67.8   4  Prostate Cancer - C61    PLAN:   Orders  Labs Urine Cytology  Schedule  Labs: 3 Weeks - PSA   3 Months - Urinalysis  Return Visit: 3 Months - Office Visit  Procedure: Unspecified Date at [facility] - 29199 - BCG (Bcg 50 Cc.) - W9604, 54098 Notes: 1/2 strength maintenance BCG for 3 weeks - begin in 1-2 weeks   Document  Letter(s):  Created for Patient: Clinical Summary   Notes:  1. High-risk non-muscle invasive bladder cancer: He has some nonspecific findings on his cystoscopy today. A bladder washing is been obtained for cytology. If positive, he will need to undergo further evaluation with biopsy. Otherwise, he will plan to proceed with 3 weeks of maintenance BCG. He will then follow up in another 3 months for continued close surveillance cystoscopy.   2. Prostate cancer: His PSA will be checked prior to his next appointment.   3. Renal cell carcinoma: No evidence for recurrence. We will proceed with repeat  chest abdominal imagingin 6-9 months.   4. Pulmonary nodule: This remains stable and will be rechecked on future chest imaging.   Cc: Dr. Sharilyn Sites   * Signed by Raynelle Bring, M.D. on 12/25/15 at 6:29 PM (EDT)*

## 2016-01-16 ENCOUNTER — Encounter (HOSPITAL_COMMUNITY): Payer: Self-pay | Admitting: *Deleted

## 2016-01-16 ENCOUNTER — Ambulatory Visit (HOSPITAL_COMMUNITY)
Admission: RE | Admit: 2016-01-16 | Discharge: 2016-01-16 | Disposition: A | Discharge status: Home / Self Care | Payer: Medicare Other | Source: Ambulatory Visit | Attending: Urology | Admitting: Urology

## 2016-01-16 ENCOUNTER — Encounter (HOSPITAL_COMMUNITY)
Admission: RE | Disposition: A | Discharge status: Home / Self Care | Payer: Self-pay | Source: Ambulatory Visit | Attending: Urology

## 2016-01-16 ENCOUNTER — Ambulatory Visit (HOSPITAL_COMMUNITY): Payer: Medicare Other | Admitting: Certified Registered Nurse Anesthetist

## 2016-01-16 DIAGNOSIS — C642 Malignant neoplasm of left kidney, except renal pelvis: Secondary | ICD-10-CM | POA: Insufficient documentation

## 2016-01-16 DIAGNOSIS — C641 Malignant neoplasm of right kidney, except renal pelvis: Secondary | ICD-10-CM | POA: Diagnosis not present

## 2016-01-16 DIAGNOSIS — C678 Malignant neoplasm of overlapping sites of bladder: Secondary | ICD-10-CM | POA: Insufficient documentation

## 2016-01-16 DIAGNOSIS — E669 Obesity, unspecified: Secondary | ICD-10-CM | POA: Insufficient documentation

## 2016-01-16 DIAGNOSIS — Z8546 Personal history of malignant neoplasm of prostate: Secondary | ICD-10-CM | POA: Insufficient documentation

## 2016-01-16 DIAGNOSIS — R911 Solitary pulmonary nodule: Secondary | ICD-10-CM | POA: Insufficient documentation

## 2016-01-16 DIAGNOSIS — Z7982 Long term (current) use of aspirin: Secondary | ICD-10-CM | POA: Insufficient documentation

## 2016-01-16 DIAGNOSIS — I10 Essential (primary) hypertension: Secondary | ICD-10-CM | POA: Insufficient documentation

## 2016-01-16 DIAGNOSIS — M1990 Unspecified osteoarthritis, unspecified site: Secondary | ICD-10-CM | POA: Insufficient documentation

## 2016-01-16 DIAGNOSIS — Z905 Acquired absence of kidney: Secondary | ICD-10-CM | POA: Insufficient documentation

## 2016-01-16 DIAGNOSIS — Z79899 Other long term (current) drug therapy: Secondary | ICD-10-CM | POA: Insufficient documentation

## 2016-01-16 DIAGNOSIS — I4510 Unspecified right bundle-branch block: Secondary | ICD-10-CM | POA: Insufficient documentation

## 2016-01-16 DIAGNOSIS — E785 Hyperlipidemia, unspecified: Secondary | ICD-10-CM | POA: Insufficient documentation

## 2016-01-16 DIAGNOSIS — G4730 Sleep apnea, unspecified: Secondary | ICD-10-CM | POA: Insufficient documentation

## 2016-01-16 DIAGNOSIS — Z6831 Body mass index (BMI) 31.0-31.9, adult: Secondary | ICD-10-CM | POA: Insufficient documentation

## 2016-01-16 DIAGNOSIS — Z791 Long term (current) use of non-steroidal anti-inflammatories (NSAID): Secondary | ICD-10-CM | POA: Insufficient documentation

## 2016-01-16 HISTORY — PX: CYSTOSCOPY WITH BIOPSY: SHX5122

## 2016-01-16 HISTORY — DX: Malignant neoplasm of bladder, unspecified: C67.9

## 2016-01-16 SURGERY — CYSTOSCOPY, WITH BIOPSY
Anesthesia: General | Laterality: Bilateral

## 2016-01-16 MED ORDER — CEFAZOLIN SODIUM-DEXTROSE 2-4 GM/100ML-% IV SOLN
2.0000 g | INTRAVENOUS | Status: AC
  Administered 2016-01-16: 2 g via INTRAVENOUS

## 2016-01-16 MED ORDER — METOCLOPRAMIDE HCL 5 MG/ML IJ SOLN: 10.0000 mg | Freq: Once | INTRAMUSCULAR | Status: DC | PRN

## 2016-01-16 MED ORDER — PROPOFOL 10 MG/ML IV BOLUS
INTRAVENOUS | Status: DC | PRN
  Administered 2016-01-16: 200 mg via INTRAVENOUS
  Administered 2016-01-16: 50 mg via INTRAVENOUS

## 2016-01-16 MED ORDER — SODIUM CHLORIDE 0.9 % IR SOLN
Status: DC | PRN
  Administered 2016-01-16: 3000 mL

## 2016-01-16 MED ORDER — LACTATED RINGERS IV SOLN
INTRAVENOUS | Status: DC
  Administered 2016-01-16: 1000 mL via INTRAVENOUS
  Administered 2016-01-16: 12:00:00 via INTRAVENOUS

## 2016-01-16 MED ORDER — ONDANSETRON HCL 4 MG/2ML IJ SOLN
INTRAMUSCULAR | Status: DC | PRN
  Administered 2016-01-16: 4 mg via INTRAVENOUS

## 2016-01-16 MED ORDER — LIDOCAINE HCL (CARDIAC) 20 MG/ML IV SOLN
INTRAVENOUS | Status: DC | PRN
  Administered 2016-01-16: 50 mg via INTRAVENOUS

## 2016-01-16 MED ORDER — MIDAZOLAM HCL 5 MG/5ML IJ SOLN
INTRAMUSCULAR | Status: DC | PRN
  Administered 2016-01-16: 1 mg via INTRAVENOUS

## 2016-01-16 MED ORDER — IOHEXOL 300 MG/ML  SOLN
INTRAMUSCULAR | Status: DC | PRN
  Administered 2016-01-16: 6 mL

## 2016-01-16 MED ORDER — EPHEDRINE SULFATE 50 MG/ML IJ SOLN
INTRAMUSCULAR | Status: AC
  Filled 2016-01-16: qty 1

## 2016-01-16 MED ORDER — PROPOFOL 10 MG/ML IV BOLUS
INTRAVENOUS | Status: AC
  Filled 2016-01-16: qty 20

## 2016-01-16 MED ORDER — SODIUM CHLORIDE 0.9 % IJ SOLN
INTRAMUSCULAR | Status: AC
  Filled 2016-01-16: qty 10

## 2016-01-16 MED ORDER — MIDAZOLAM HCL 2 MG/2ML IJ SOLN
INTRAMUSCULAR | Status: AC
  Filled 2016-01-16: qty 2

## 2016-01-16 MED ORDER — MEPERIDINE HCL 50 MG/ML IJ SOLN: 6.2500 mg | INTRAMUSCULAR | Status: DC | PRN

## 2016-01-16 MED ORDER — FENTANYL CITRATE (PF) 100 MCG/2ML IJ SOLN
INTRAMUSCULAR | Status: DC | PRN
  Administered 2016-01-16 (×4): 25 ug via INTRAVENOUS

## 2016-01-16 MED ORDER — DEXAMETHASONE SODIUM PHOSPHATE 4 MG/ML IJ SOLN
INTRAMUSCULAR | Status: DC | PRN
  Administered 2016-01-16: 10 mg via INTRAVENOUS

## 2016-01-16 MED ORDER — KETOROLAC TROMETHAMINE 30 MG/ML IJ SOLN
INTRAMUSCULAR | Status: AC
  Filled 2016-01-16: qty 1

## 2016-01-16 MED ORDER — ONDANSETRON HCL 4 MG/2ML IJ SOLN
INTRAMUSCULAR | Status: AC
  Filled 2016-01-16: qty 2

## 2016-01-16 MED ORDER — FENTANYL CITRATE (PF) 100 MCG/2ML IJ SOLN: 25.0000 ug | INTRAMUSCULAR | Status: DC | PRN

## 2016-01-16 MED ORDER — HYDROCODONE-ACETAMINOPHEN 5-325 MG PO TABS: 1.0000 | ORAL_TABLET | Freq: Four times a day (QID) | ORAL | Status: DC | PRN

## 2016-01-16 MED ORDER — STERILE WATER FOR IRRIGATION IR SOLN
Status: DC | PRN
  Administered 2016-01-16: 3000 mL

## 2016-01-16 MED ORDER — CEFAZOLIN SODIUM-DEXTROSE 2-4 GM/100ML-% IV SOLN
INTRAVENOUS | Status: AC
Start: 2016-01-16 — End: 2016-01-16
  Filled 2016-01-16: qty 100

## 2016-01-16 MED ORDER — EPHEDRINE SULFATE 50 MG/ML IJ SOLN
INTRAMUSCULAR | Status: DC | PRN
  Administered 2016-01-16 (×2): 5 mg via INTRAVENOUS

## 2016-01-16 MED ORDER — FENTANYL CITRATE (PF) 100 MCG/2ML IJ SOLN
INTRAMUSCULAR | Status: AC
  Filled 2016-01-16: qty 2

## 2016-01-16 MED ORDER — DEXAMETHASONE SODIUM PHOSPHATE 10 MG/ML IJ SOLN
INTRAMUSCULAR | Status: AC
  Filled 2016-01-16: qty 1

## 2016-01-16 MED ORDER — PHENAZOPYRIDINE HCL 100 MG PO TABS: 100.0000 mg | ORAL_TABLET | Freq: Three times a day (TID) | ORAL | Status: DC | PRN

## 2016-01-16 MED ORDER — LIDOCAINE HCL (CARDIAC) 20 MG/ML IV SOLN
INTRAVENOUS | Status: AC
  Filled 2016-01-16: qty 5

## 2016-01-16 SURGICAL SUPPLY — 14 items
BAG URINE DRAINAGE (UROLOGICAL SUPPLIES) IMPLANT
BAG URO CATCHER STRL LF (MISCELLANEOUS) ×3 IMPLANT
ELECT REM PT RETURN 9FT ADLT (ELECTROSURGICAL) ×3
ELECTRODE REM PT RTRN 9FT ADLT (ELECTROSURGICAL) ×1 IMPLANT
EVACUATOR MICROVAS BLADDER (UROLOGICAL SUPPLIES) IMPLANT
GLOVE BIOGEL M STRL SZ7.5 (GLOVE) ×3 IMPLANT
GOWN STRL REUS W/TWL LRG LVL3 (GOWN DISPOSABLE) ×3 IMPLANT
LOOP CUT BIPOLAR 24F LRG (ELECTROSURGICAL) IMPLANT
MANIFOLD NEPTUNE II (INSTRUMENTS) ×3 IMPLANT
PACK CYSTO (CUSTOM PROCEDURE TRAY) ×3 IMPLANT
SET ASPIRATION TUBING (TUBING) IMPLANT
SYRINGE IRR TOOMEY STRL 70CC (SYRINGE) IMPLANT
TUBING CONNECTING 10 (TUBING) ×2 IMPLANT
TUBING CONNECTING 10' (TUBING) ×1

## 2016-01-16 NOTE — Discharge Instructions (Signed)
1. You may see some blood in the urine and may have some burning with urination for 48-72 hours. You also may notice that you have to urinate more frequently or urgently after your procedure which is normal.  °2. You should call should you develop an inability urinate, fever > 101, persistent nausea and vomiting that prevents you from eating or drinking to stay hydrated.  °

## 2016-01-16 NOTE — Anesthesia Postprocedure Evaluation (Signed)
Anesthesia Post Note  Patient: Mason Scott  Procedure(s) Performed: Procedure(s) (LRB): CYSTOSCOPY WITH BLADDER BIOPSY,  BILATERAL RETROGRADE PYELOGRAM (Bilateral)  Patient location during evaluation: PACU Anesthesia Type: General Level of consciousness: awake and alert Pain management: pain level controlled Vital Signs Assessment: post-procedure vital signs reviewed and stable Respiratory status: spontaneous breathing, nonlabored ventilation, respiratory function stable and patient connected to nasal cannula oxygen Cardiovascular status: blood pressure returned to baseline and stable Postop Assessment: no signs of nausea or vomiting Anesthetic complications: no    Last Vitals:  Filed Vitals:   01/16/16 1321 01/16/16 1359  BP: 129/88 158/70  Pulse: 49 59  Temp: 37.1 C 36.4 C  Resp: 12 18    Last Pain: There were no vitals filed for this visit.               Montez Hageman

## 2016-01-16 NOTE — Op Note (Signed)
Preoperative diagnosis: 1. Bladder tumor (2 cm)  Postoperative diagnosis:  1. Bladder tumor (2 cm)  Procedure:  1. Cystoscopy 2. Transurethral resection of bladder tumor (largest 2 cm) 3. Bilateral retrograde pyelography with interpretation  Surgeon: Pryor Curia. M.D.  Anesthesia: General  Complications: None  Intraoperative findings:  1. Bladder tumor: There were three areas of suspicion including the left posterior bladder where two small (< 1 cm) papillary tumors were identified, an area of flat erythematous tissue/tumor (2 cm) on the right lateral bladder wall, and a small (1.5 cm) area of erythema at the dome. 2. Retrograde pyelography: Using a 6 Fr ureteral catheter and Omnipaque contrast, bilateral retrograde pyelograms were performed.  There were no ureteral or renal pelvic filling defects and no dilation noted bilaterally.   EBL: Minimal  Specimens: 1. Left posterior bladder tumors 2. Right lateral bladder wall tumor 3. Biopsy of bladder dome lesion  Disposition of specimens: Pathology  Indication: Mason Scott is a patient who was found to have a bladder tumor. After reviewing the management options for treatment, he elected to proceed with the above surgical procedure(s). We have discussed the potential benefits and risks of the procedure, side effects of the proposed treatment, the likelihood of the patient achieving the goals of the procedure, and any potential problems that might occur during the procedure or recuperation. Informed consent has been obtained.  Description of procedure:  The patient was taken to the operating room and general anesthesia was induced.  The patient was placed in the dorsal lithotomy position, prepped and draped in the usual sterile fashion, and preoperative antibiotics were administered. A preoperative time-out was performed.   Cystourethroscopy was performed.  The patient's urethra was examined and was normal.  The  bladder was then systematically examined in its entirety. Findings are as dictated above.  Attention then turned to the right ureteral orifice and a ureteral catheter was used to intubate the ureteral orifice.  Omnipaque contrast was injected through the ureteral catheter and a retrograde pyelogram was performed with findings as dictated above.  Attention then turned to the left ureteral orifice and a ureteral catheter was used to intubate the ureteral orifice.  Omnipaque contrast was injected through the ureteral catheter and a retrograde pyelogram was performed with findings as dictated above.  The bladder was then re-examined.  The left posterior bladder tumors were then transurethrally resected in their entirety.  Similarly, the right lateral lesion and lesion at the dome were resected so that all grossly abnormal tissue was removed.   Separate biopsies were also taken of the underlying deep bladder tissue.   Hemostasis was then achieved with the loop cautery and the bladder was emptied and reinspected with no further bleeding noted at the end of the procedure.    The bladder was then emptied and the procedure ended.  The patient appeared to tolerate the procedure well and without complications.  The patient was able to be awakened and transferred to the recovery unit in satisfactory condition.    Pryor Curia MD

## 2016-01-16 NOTE — Anesthesia Preprocedure Evaluation (Signed)
Anesthesia Evaluation  Patient identified by MRN, date of birth, ID band Patient awake    Reviewed: Allergy & Precautions, NPO status , Patient's Chart, lab work & pertinent test results  Airway Mallampati: II  TM Distance: >3 FB Neck ROM: Full    Dental  (+) Dental Advisory Given,    Pulmonary sleep apnea and Continuous Positive Airway Pressure Ventilation ,    Pulmonary exam normal breath sounds clear to auscultation       Cardiovascular Exercise Tolerance: Good hypertension, Pt. on medications Normal cardiovascular exam Rhythm:Regular Rate:Normal  ECG: RBBB   Neuro/Psych negative neurological ROS  negative psych ROS   GI/Hepatic negative GI ROS, Neg liver ROS,   Endo/Other  negative endocrine ROS  Renal/GU Renal diseaseChronic kidney disease.  negative genitourinary   Musculoskeletal  (+) Arthritis ,   Abdominal (+) + obese,   Peds negative pediatric ROS (+)  Hematology negative hematology ROS (+)   Anesthesia Other Findings   Reproductive/Obstetrics negative OB ROS                             Anesthesia Physical  Anesthesia Plan  ASA: II  Anesthesia Plan: General   Post-op Pain Management:    Induction: Intravenous  Airway Management Planned: LMA  Additional Equipment:   Intra-op Plan:   Post-operative Plan: Extubation in OR  Informed Consent: I have reviewed the patients History and Physical, chart, labs and discussed the procedure including the risks, benefits and alternatives for the proposed anesthesia with the patient or authorized representative who has indicated his/her understanding and acceptance.   Dental advisory given  Plan Discussed with: CRNA  Anesthesia Plan Comments:         Anesthesia Quick Evaluation

## 2016-01-16 NOTE — Progress Notes (Signed)
Patient may go to Short Stay now- per Dr. Marcell Barlow

## 2016-01-16 NOTE — Anesthesia Procedure Notes (Signed)
Procedure Name: LMA Insertion Date/Time: 01/16/2016 11:22 AM Performed by: Deliah Boston Pre-anesthesia Checklist: Patient identified, Emergency Drugs available, Suction available and Patient being monitored Patient Re-evaluated:Patient Re-evaluated prior to inductionOxygen Delivery Method: Circle system utilized Preoxygenation: Pre-oxygenation with 100% oxygen Intubation Type: IV induction Ventilation: Mask ventilation without difficulty LMA: LMA inserted LMA Size: 4.0 Number of attempts: 1 Placement Confirmation: positive ETCO2 and breath sounds checked- equal and bilateral Tube secured with: Tape Dental Injury: Teeth and Oropharynx as per pre-operative assessment

## 2016-01-16 NOTE — Interval H&P Note (Signed)
History and Physical Interval Note:  01/16/2016 11:10 AM  Mason Scott  has presented today for surgery, with the diagnosis of BLADDER CANCER  The various methods of treatment have been discussed with the patient and family. After consideration of risks, benefits and other options for treatment, the patient has consented to  Procedure(s) with comments: CYSTOSCOPY WITH BLADDER BIOPSY, POSSIBLE TRANSURETHRAL RESECTION OF BLADDER, POSSIBLE BILATERAL RETROGRADE PYELOGRAM (Bilateral) - 1 HOUR as a surgical intervention .  The patient's history has been reviewed, patient examined, no change in status, stable for surgery.  I have reviewed the patient's chart and labs.  Questions were answered to the patient's satisfaction.     Brewster Wolters,LES

## 2016-01-16 NOTE — Transfer of Care (Signed)
Immediate Anesthesia Transfer of Care Note  Patient: Mason Scott  Procedure(s) Performed: Procedure(s) with comments: CYSTOSCOPY WITH BLADDER BIOPSY,  BILATERAL RETROGRADE PYELOGRAM (Bilateral) - 1 HOUR  Patient Location: PACU  Anesthesia Type:general  Level of Consciousness: Patient easily awoken, sedated, comfortable, cooperative, following commands, responds to stimulation.   Airway & Oxygen Therapy: Patient spontaneously breathing, ventilating well, oxygen via simple oxygen mask.  Post-op Assessment: Report given to PACU RN, vital signs reviewed and stable, moving all extremities.   Post vital signs: Reviewed and stable.  Complications: No apparent anesthesia complications

## 2016-05-20 ENCOUNTER — Other Ambulatory Visit: Payer: Self-pay | Admitting: Urology

## 2016-05-21 NOTE — Patient Instructions (Signed)
TOMER ROSSEN  05/21/2016   Your procedure is scheduled on: 05-25-16  Report to [facility] Main  Entrance take [facility]  elevators to 3rd floor to  [place] at 1030  AM.  Call this number if you have problems the morning of surgery [phone]   Remember: ONLY 1 PERSON MAY GO WITH YOU TO SHORT STAY TO GET  READY MORNING OF [place].  Do not eat food or drink liquids :After Midnight.     Take these medicines the morning of surgery with A SIP OF WATER: AMLODIPINE (NORVASC), HYDROCODNE IF NEEDED, PYRIDIUM IF NEEDED              You may not have any metal on your body including hair pins and              piercings  Do not wear jewelry, make-up, lotions, powders or perfumes, deodorant             Do not wear nail polish.  Do not shave  48 hours prior to surgery.              Men may shave face and neck.   Do not bring valuables to the hospital. [place].  Contacts, dentures or bridgework may not be worn into surgery.  Leave suitcase in the car. After surgery it may be brought to your room.     Patients discharged the day of surgery will not be allowed to drive home.  Name and phone number of your driver:  Special Instructions: N/A              Please read over the following fact sheets you were given: _____________________________________________________________________             [facility] - Preparing for Surgery Before surgery, you can play an important role.  Because skin is not sterile, your skin needs to be as free of germs as possible.  You can reduce the number of germs on your skin by washing with CHG (chlorahexidine gluconate) soap before surgery.  CHG is an antiseptic cleaner which kills germs and bonds with the skin to continue killing germs even after washing. Please DO NOT use if you have an allergy to CHG or antibacterial soaps.  If your skin becomes reddened/irritated stop using  the CHG and inform your nurse when you arrive at Short Stay. Do not shave (including legs and underarms) for at least 48 hours prior to the first CHG shower.  You may shave your face/neck. Please follow these instructions carefully:  1.  Shower with CHG Soap the night before surgery and the  morning of Surgery.  2.  If you choose to wash your hair, wash your hair first as usual with your  normal  shampoo.  3.  After you shampoo, rinse your hair and body thoroughly to remove the  shampoo.                           4.  Use CHG as you would any other liquid soap.  You can apply chg directly  to the skin and wash  Gently with a scrungie or clean washcloth.  5.  Apply the CHG Soap to your body ONLY FROM THE NECK DOWN.   Do not use on face/ open                           Wound or open sores. Avoid contact with eyes, ears mouth and genitals (private parts).                       Wash face,  Genitals (private parts) with your normal soap.             6.  Wash thoroughly, paying special attention to the area where your surgery  will be performed.  7.  Thoroughly rinse your body with warm water from the neck down.  8.  DO NOT shower/wash with your normal soap after using and rinsing off  the CHG Soap.                9.  Pat yourself dry with a clean towel.            10.  Wear clean pajamas.            11.  Place clean sheets on your bed the night of your first shower and do not  sleep with pets. Day of Surgery : Do not apply any lotions/deodorants the morning of surgery.  Please wear clean clothes to the hospital/surgery center.  FAILURE TO FOLLOW THESE INSTRUCTIONS MAY RESULT IN THE CANCELLATION OF YOUR SURGERY PATIENT SIGNATURE_________________________________  NURSE SIGNATURE__________________________________  ________________________________________________________________________

## 2016-05-22 ENCOUNTER — Encounter (HOSPITAL_COMMUNITY): Payer: Self-pay

## 2016-05-22 ENCOUNTER — Encounter (HOSPITAL_COMMUNITY)
Admission: RE | Admit: 2016-05-22 | Discharge: 2016-05-22 | Disposition: A | Discharge status: Home / Self Care | Payer: Medicare Other | Source: Ambulatory Visit | Attending: Urology | Admitting: Urology

## 2016-05-22 DIAGNOSIS — I4510 Unspecified right bundle-branch block: Secondary | ICD-10-CM | POA: Insufficient documentation

## 2016-05-22 DIAGNOSIS — Z8551 Personal history of malignant neoplasm of bladder: Secondary | ICD-10-CM | POA: Diagnosis not present

## 2016-05-22 DIAGNOSIS — Z9989 Dependence on other enabling machines and devices: Secondary | ICD-10-CM | POA: Diagnosis not present

## 2016-05-22 DIAGNOSIS — C679 Malignant neoplasm of bladder, unspecified: Secondary | ICD-10-CM

## 2016-05-22 DIAGNOSIS — G4730 Sleep apnea, unspecified: Secondary | ICD-10-CM | POA: Diagnosis not present

## 2016-05-22 DIAGNOSIS — R001 Bradycardia, unspecified: Secondary | ICD-10-CM

## 2016-05-22 DIAGNOSIS — I517 Cardiomegaly: Secondary | ICD-10-CM

## 2016-05-22 DIAGNOSIS — C61 Malignant neoplasm of prostate: Secondary | ICD-10-CM | POA: Insufficient documentation

## 2016-05-22 DIAGNOSIS — Z8612 Personal history of poliomyelitis: Secondary | ICD-10-CM | POA: Diagnosis not present

## 2016-05-22 DIAGNOSIS — Z8546 Personal history of malignant neoplasm of prostate: Secondary | ICD-10-CM | POA: Diagnosis not present

## 2016-05-22 DIAGNOSIS — Z791 Long term (current) use of non-steroidal anti-inflammatories (NSAID): Secondary | ICD-10-CM | POA: Diagnosis not present

## 2016-05-22 DIAGNOSIS — Z01812 Encounter for preprocedural laboratory examination: Secondary | ICD-10-CM

## 2016-05-22 DIAGNOSIS — Z01810 Encounter for preprocedural cardiovascular examination: Secondary | ICD-10-CM

## 2016-05-22 DIAGNOSIS — I129 Hypertensive chronic kidney disease with stage 1 through stage 4 chronic kidney disease, or unspecified chronic kidney disease: Secondary | ICD-10-CM | POA: Insufficient documentation

## 2016-05-22 DIAGNOSIS — G4733 Obstructive sleep apnea (adult) (pediatric): Secondary | ICD-10-CM

## 2016-05-22 DIAGNOSIS — Z79899 Other long term (current) drug therapy: Secondary | ICD-10-CM | POA: Diagnosis not present

## 2016-05-22 DIAGNOSIS — Z08 Encounter for follow-up examination after completed treatment for malignant neoplasm: Secondary | ICD-10-CM | POA: Diagnosis not present

## 2016-05-22 DIAGNOSIS — E785 Hyperlipidemia, unspecified: Secondary | ICD-10-CM

## 2016-05-22 DIAGNOSIS — Z905 Acquired absence of kidney: Secondary | ICD-10-CM | POA: Diagnosis not present

## 2016-05-22 DIAGNOSIS — M1990 Unspecified osteoarthritis, unspecified site: Secondary | ICD-10-CM | POA: Insufficient documentation

## 2016-05-22 DIAGNOSIS — Z85528 Personal history of other malignant neoplasm of kidney: Secondary | ICD-10-CM | POA: Diagnosis not present

## 2016-05-22 DIAGNOSIS — N189 Chronic kidney disease, unspecified: Secondary | ICD-10-CM | POA: Insufficient documentation

## 2016-05-22 DIAGNOSIS — I10 Essential (primary) hypertension: Secondary | ICD-10-CM | POA: Diagnosis not present

## 2016-05-22 DIAGNOSIS — Z7982 Long term (current) use of aspirin: Secondary | ICD-10-CM | POA: Diagnosis not present

## 2016-05-22 LAB — CBC
HCT: 40.3 % (ref 39.0–52.0)
Hemoglobin: 13.7 g/dL (ref 13.0–17.0)
MCH: 31.1 pg (ref 26.0–34.0)
MCHC: 34 g/dL (ref 30.0–36.0)
MCV: 91.6 fL (ref 78.0–100.0)
Platelets: 237 10*3/uL (ref 150–400)
RBC: 4.4 MIL/uL (ref 4.22–5.81)
RDW: 12.4 % (ref 11.5–15.5)
WBC: 6.5 10*3/uL (ref 4.0–10.5)

## 2016-05-22 LAB — BASIC METABOLIC PANEL
Anion gap: 9 (ref 5–15)
BUN: 22 mg/dL — AB (ref 6–20)
CO2: 28 mmol/L (ref 22–32)
CREATININE: 1.33 mg/dL — AB (ref 0.61–1.24)
Calcium: 9.4 mg/dL (ref 8.9–10.3)
Chloride: 103 mmol/L (ref 101–111)
GFR calc non Af Amer: 53 mL/min — ABNORMAL LOW (ref >60)
Glucose, Bld: 103 mg/dL — ABNORMAL HIGH (ref 65–99)
Potassium: 4.4 mmol/L (ref 3.5–5.1)
SODIUM: 140 mmol/L (ref 135–145)

## 2016-05-22 NOTE — Progress Notes (Signed)
BMET RESULTS FAXED TO DR Benoit

## 2016-05-22 NOTE — H&P (Signed)
Office Visit Report     05/12/2016   --------------------------------------------------------------------------------   Mason Scott  MRN: [id number]  PRIMARY CARE:  Sharilyn Sites, MD  DOB: [birth year], 70 year old Male  REFERRING:  Kathie Rhodes, MD  SSN: -[id number]  PROVIDER:  Raynelle Bring, M.D.    LOCATION:  [facility] 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 Akel is a 70 year old gentleman follows up for the above urologic issues. Since his last visit, he did undergo repeat BCG induction and tolerated this very well. He made an extra effort to try and retained BCG this time and was able along to for 2-3 hours at each treatment. He suffered no ill effects. He denies any recent hematuria.     ALLERGIES: No Allergies    MEDICATIONS: AmLODIPine Besylate 10 MG Oral Tablet Oral  Aspir-81 81 MG Oral Tablet Delayed Release Oral  CPAP  Fish Oil Concentrate 1000 MG Oral Capsule Oral  Meloxicam 15 MG Oral Tablet 0 Oral  Valsartan-Hydrochlorothiazide 160-25 MG Oral Tablet Oral     GU PSH: Bladder Instill AntiCA Agent - 04/09/2016, 04/02/2016, 03/26/2016, 03/19/2016, 03/12/2016, 03/05/2016 Cysto Bladder Ureth Biopsy - 11/20/2014, 2015 Cystoscopy - 12/25/2015 Cystoscopy TURBT 2-5 cm - 01/16/2016, 2015 Lap Partial Nephrectomy - 05/28/2015, 02/26/2015 Locm 300-399Mg/Ml Iodine,1Ml - 12/18/2015 TRANSPERI NEEDLE PLACE, PROS - 2014      PSH Notes: Kidney Surgery Laparoscopic Partial Nephrectomy, Kidney Surgery Laparoscopic Partial Nephrectomy, Cystoscopy With Biopsy, Cystoscopy With Biopsy, Cystoscopy With Fulguration Medium Lesion (2-5cm), Surgery Prostate Transperineal Placement Of Needles, Cystoscopy Bladder Tumor   NON-GU PSH: None   GU PMH: Bladder Cancer, overlapping sites - 12/25/2015, Malignant neoplasm of  overlapping sites of bladder, - 08/27/2015 Kidney Cancer Left, expect renal pelvis - 12/25/2015, Renal cell carcinoma, left, - 06/10/2015 Kidney Cancer Right, except renal pelvis - 12/25/2015, Renal cell carcinoma, right, - 05/02/2015 Prostate Cancer, Prostate cancer - 08/27/2015 Urinary Urgency, Urinary urgency - 2015 Urinary Tract Inf, Unspec site, Urinary tract infection - 2015 Male ED, unspecified, Erectile dysfunction - 2015 Urinary incontinence, Unspec, Urinary incontinence - 2014      PMH Notes:  1) Prostate cancer: He was diagnosed with cT1cNx Mx, Gleason 3+3=6 adenocarcinoma of the prostate (pretreatment PSA 6.14) in November 2013 and is s/p treatment with brachytherapy (Dr. Arloa Koh) on 09/29/12.   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 He was noted to have a recurrent, high grade Ta urothelial carcinoma in 2015.   Sep 1991: TURBT, Thiotepa  Mar 2015: Erythematous changes and atypical cytology with positive FISH  Apr 2015: TURBT: High grade, Ta urothelial carcinoma of left lateral bladder  Jun-Jul 2015: Induction BCG  Sep 2015: TURBT: Suspicious cytology, Low grade Ta tumor of dome of the bladder  Sep-Oct 2015: 3 week maintenance BCG (rash developed ? related to BCG)  Jan 2016: Cytology suspicious, Upper tract imaging (CT negative), cysto negative  Feb 2016: 3 week maintenance BCG  May 2016: TUR - L posterior high grade Ta tumor, R lateral low grade Ta tumor  May-Jun 2016: Repeat induction BCG  Jan 2016: Maintenance BCG  Jul 2017: TURBT - Multiple high grade Ta tumors  Aug-Oct 2017: Repeat induction BCG   3) Incontinence: He presented with new onset incontinence  in July 2014 associated with urgency. This resolved spontaneously without the need for treatment.   4) Bilateral renal masses: He was incidentally noted to  have bilateral enhancing renal masses on CT imaging during an upper tract study for his urothelial carcinoma. An MRI confirmed enhancing renal masses including a 1.8 cm posterior lower pole right renal mass and a 2.1 cm interpolar left renal mass off the posterior hilar lip. 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 He underwent a left partial nephrectomy in November 2016 demonstrating a type I papillary renal cell carcinoma.   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 with negative surgical margins   Nov 2016: Left partial nephrectomy - pT1a Nx Mx, Fuhrman grade 2 type I papillary renal cell carcinoma with negative surgical margins   2012-07-08 11:10:57 - Note: Arthritis   NON-GU PMH: Encounter for antineoplastic chemotherapy - 03/12/2016 Disorder of thyroid, unspecified, Thyroid mass - 2016 Other nonspecific abnormal finding of lung field, Lung mass - 2016 Personal history of other diseases of the circulatory system, History of hypertension - 2014 Personal history of other diseases of the nervous system and sense organs, History of sleep apnea - 2014 Personal history of other endocrine, nutritional and metabolic disease, History of hyperlipidemia - 2014    FAMILY HISTORY: Breast Cancer - Mother Stroke Syndrome - Father   SOCIAL HISTORY: Marital Status: Married Current Smoking Status: Patient has never smoked.  Social Drinker.  Drinks 1 caffeinated drink per day.    REVIEW OF SYSTEMS:    GU Review Male:   Patient denies frequent urination, hard to postpone urination, burning/ pain with urination, get up at night to urinate, leakage of urine, stream starts and stops, trouble starting your streams, and have to strain to urinate .  Gastrointestinal (Upper):   Patient denies nausea and vomiting.   Gastrointestinal (Lower):   Patient denies diarrhea and constipation.  Constitutional:   Patient denies fever, night sweats, weight loss, and fatigue.  Skin:   Patient denies skin rash/ lesion and itching.  Eyes:   Patient denies blurred vision and double vision.  Ears/ Nose/ Throat:   Patient denies sore throat and sinus problems.  Hematologic/Lymphatic:   Patient denies easy bruising and swollen glands.  Cardiovascular:   Patient denies leg swelling and chest pains.  Respiratory:   Patient denies cough and shortness of breath.  Endocrine:   Patient denies excessive thirst.  Musculoskeletal:   Patient denies back pain and joint pain.  Neurological:   Patient denies headaches and dizziness.  Psychologic:   Patient denies depression and anxiety.   VITAL SIGNS:      05/12/2016 01:57 PM  Weight 227 lb / 102.97 kg  Height 70 in / 177.8 cm  BP 150/83 mmHg  Pulse 69 /min  BMI 32.6 kg/m   GU PHYSICAL EXAMINATION:    Urethral Meatus: Normal size. No lesion, no wart, no polyp, no balanitis, no discharge. Normal location.    MULTI-SYSTEM PHYSICAL EXAMINATION:    Constitutional: Well-nourished. No physical deformities. Normally developed. Good grooming.     PAST DATA REVIEWED:  Source Of History:  Patient  Lab Test Review:   PSA  Urine Test Review:   Urinalysis   05/01/16 08/22/15 02/15/15 07/12/14 09/20/13 03/03/13  PSA  Total PSA 0.11 ng/dl 0.49  0.44  1.41  0.84  1.54     PROCEDURES:         Flexible Cystoscopy - 52000  Indication: Bladder cancer Risks, benefits, and potential complications of the procedure were discussed with the patient including infection, bleeding, voiding discomfort, urinary retention, fever, chills, sepsis, and others. All questions were answered. Informed consent was obtained. Sterile technique and intraurethral analgesia were used.  Meatus:  Normal size. Normal location. Normal condition.  Urethra:  No strictures.  External Sphincter:  Normal.   Verumontanum:  Normal.  Prostate:  Bilateral Lobe hypertrophy  Bladder Neck:  Non-obstructing.  Ureteral Orifices:  Normal location. Normal size. Normal shape. Effluxed clear urine.  Bladder:  No trabeculation. No tumors. Normal mucosa. No stones.      Chaperone: Fransisco Beau The procedure was well-tolerated and without complications. Instructions were given to call the office immediately if questions or problems.         Urinalysis Dipstick Dipstick Cont'd  Color: Yellow Bilirubin: Neg  Appearance: Clear Ketones: Neg  Specific Gravity: 1.015 Blood: Neg  pH: 6.0 Protein: Neg  Glucose: Neg Urobilinogen: 0.2    Nitrites: Neg    Leukocyte Esterase: Neg    ASSESSMENT:      ICD-10 Details  1 GU:   Bladder Cancer, overlapping sites - C67.8   2   Kidney Cancer Left, expect renal pelvis - C64.2   3   Kidney Cancer Right, except renal pelvis - C64.1    PLAN:           Orders Labs Urine Cytology  Lab Notes: Washing          Schedule Labs: 3 Months - Urinalysis    3 Months - CMP  X-Rays: 3 Months - C.T. Chest With I.V. Contrast    3 Months - C.T. Abdomen With and Without I.V. Contrast  Return Visit: 3 Months - Office Visit, Cystoscopy  Return Visit: Other See Visit Notes - Nurse Visit             Note: Begin 3 week maintenance BCG 1/2 strength in 1-2 weeks          Document Letter(s):  Created for Patient: Clinical Summary         Notes:   1. 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 A bladder washing has been obtained for cytology. He will tentatively be scheduled for 3 weeks of maintenance BCG. He will then see me in 3 months for ongoing close cystoscopic surveillance.   2. Prostate cancer: His PSA continues to decline without evidence for recurrence. He will be due to have this rechecked in approximately 6 months.   3. Renal cell carcinoma/pulmonary nodule: He will follow up in 3 months with a CT scan of the chest and  abdomen for ongoing surveillance.   CC: Dr. Sharilyn Sites    * Signed by Raynelle Bring, M.D. on 05/12/16 at 9:11 PM (EST)*   Regarding: Cytology and BCG Discussion Occurred With: Patient Mason Scott)  Message: Mr. Soffer presented today for his maintenance BCG treatment. However, it was noted that his cytology had not yet returned. I therefore put in a call to the cytopathologist and we found out that the preliminary results suggested that he did have atypical cells suspicious for high-grade urothelial carcinoma. We therefore canceled his BCG. I recommended that we proceed with bladder biopsies, retrograde pyelography, and possible prostatic urethral biopsy before proceeding with further intravesical treatment. We reviewed the potential risks/complications  of this procedure as well as the expected recovery process. This will be scheduled pending the final results of his cytology which should be forthcoming.   * Signed by Raynelle Bring, M.D. on 05/20/16 at 11:01 AM (EST)*

## 2016-05-25 ENCOUNTER — Ambulatory Visit (HOSPITAL_COMMUNITY): Payer: Medicare Other | Admitting: Anesthesiology

## 2016-05-25 ENCOUNTER — Encounter (HOSPITAL_COMMUNITY)
Admission: RE | Disposition: A | Discharge status: Home / Self Care | Payer: Self-pay | Source: Ambulatory Visit | Attending: Urology

## 2016-05-25 ENCOUNTER — Encounter (HOSPITAL_COMMUNITY): Payer: Self-pay | Admitting: *Deleted

## 2016-05-25 ENCOUNTER — Ambulatory Visit (HOSPITAL_COMMUNITY)
Admission: RE | Admit: 2016-05-25 | Discharge: 2016-05-25 | Disposition: A | Discharge status: Home / Self Care | Payer: Medicare Other | Source: Ambulatory Visit | Attending: Urology | Admitting: Urology

## 2016-05-25 DIAGNOSIS — Z85528 Personal history of other malignant neoplasm of kidney: Secondary | ICD-10-CM | POA: Insufficient documentation

## 2016-05-25 DIAGNOSIS — Z79899 Other long term (current) drug therapy: Secondary | ICD-10-CM | POA: Insufficient documentation

## 2016-05-25 DIAGNOSIS — G4730 Sleep apnea, unspecified: Secondary | ICD-10-CM | POA: Insufficient documentation

## 2016-05-25 DIAGNOSIS — Z8551 Personal history of malignant neoplasm of bladder: Secondary | ICD-10-CM | POA: Insufficient documentation

## 2016-05-25 DIAGNOSIS — Z08 Encounter for follow-up examination after completed treatment for malignant neoplasm: Secondary | ICD-10-CM | POA: Insufficient documentation

## 2016-05-25 DIAGNOSIS — Z8612 Personal history of poliomyelitis: Secondary | ICD-10-CM | POA: Insufficient documentation

## 2016-05-25 DIAGNOSIS — Z9989 Dependence on other enabling machines and devices: Secondary | ICD-10-CM | POA: Insufficient documentation

## 2016-05-25 DIAGNOSIS — Z7982 Long term (current) use of aspirin: Secondary | ICD-10-CM | POA: Insufficient documentation

## 2016-05-25 DIAGNOSIS — Z905 Acquired absence of kidney: Secondary | ICD-10-CM | POA: Insufficient documentation

## 2016-05-25 DIAGNOSIS — Z8546 Personal history of malignant neoplasm of prostate: Secondary | ICD-10-CM | POA: Insufficient documentation

## 2016-05-25 DIAGNOSIS — Z791 Long term (current) use of non-steroidal anti-inflammatories (NSAID): Secondary | ICD-10-CM | POA: Insufficient documentation

## 2016-05-25 DIAGNOSIS — I10 Essential (primary) hypertension: Secondary | ICD-10-CM | POA: Insufficient documentation

## 2016-05-25 HISTORY — PX: CYSTOSCOPY W/ RETROGRADES: SHX1426

## 2016-05-25 SURGERY — CYSTOSCOPY, WITH RETROGRADE PYELOGRAM
Anesthesia: General

## 2016-05-25 MED ORDER — PHENAZOPYRIDINE HCL 100 MG PO TABS: 100.0000 mg | ORAL_TABLET | Freq: Three times a day (TID) | ORAL | 0 refills | Status: DC | PRN

## 2016-05-25 MED ORDER — FENTANYL CITRATE (PF) 100 MCG/2ML IJ SOLN
INTRAMUSCULAR | Status: DC | PRN
  Administered 2016-05-25 (×2): 50 ug via INTRAVENOUS

## 2016-05-25 MED ORDER — STERILE WATER FOR IRRIGATION IR SOLN
Status: DC | PRN
  Administered 2016-05-25: 3000 mL

## 2016-05-25 MED ORDER — FENTANYL CITRATE (PF) 100 MCG/2ML IJ SOLN: 25.0000 ug | INTRAMUSCULAR | Status: DC | PRN

## 2016-05-25 MED ORDER — MIDAZOLAM HCL 2 MG/2ML IJ SOLN
INTRAMUSCULAR | Status: AC
  Filled 2016-05-25: qty 2

## 2016-05-25 MED ORDER — HYDROCODONE-ACETAMINOPHEN 5-325 MG PO TABS
1.0000 | ORAL_TABLET | Freq: Once | ORAL | Status: AC
  Administered 2016-05-25: 1 via ORAL
  Filled 2016-05-25: qty 1

## 2016-05-25 MED ORDER — IOHEXOL 300 MG/ML  SOLN
INTRAMUSCULAR | Status: DC | PRN
  Administered 2016-05-25: 10 mL

## 2016-05-25 MED ORDER — PROMETHAZINE HCL 25 MG/ML IJ SOLN: 6.2500 mg | INTRAMUSCULAR | Status: DC | PRN

## 2016-05-25 MED ORDER — FENTANYL CITRATE (PF) 100 MCG/2ML IJ SOLN
INTRAMUSCULAR | Status: AC
  Filled 2016-05-25: qty 2

## 2016-05-25 MED ORDER — PROPOFOL 10 MG/ML IV BOLUS
INTRAVENOUS | Status: AC
  Filled 2016-05-25: qty 20

## 2016-05-25 MED ORDER — CEFAZOLIN SODIUM-DEXTROSE 2-4 GM/100ML-% IV SOLN
2.0000 g | INTRAVENOUS | Status: AC
  Administered 2016-05-25: 2 g via INTRAVENOUS
  Filled 2016-05-25: qty 100

## 2016-05-25 MED ORDER — CEFAZOLIN SODIUM-DEXTROSE 2-4 GM/100ML-% IV SOLN
INTRAVENOUS | Status: AC
  Filled 2016-05-25: qty 100

## 2016-05-25 MED ORDER — PROPOFOL 10 MG/ML IV BOLUS
INTRAVENOUS | Status: DC | PRN
  Administered 2016-05-25: 160 mg via INTRAVENOUS

## 2016-05-25 MED ORDER — LIDOCAINE 2% (20 MG/ML) 5 ML SYRINGE
INTRAMUSCULAR | Status: DC | PRN
  Administered 2016-05-25: 80 mg via INTRAVENOUS

## 2016-05-25 MED ORDER — LACTATED RINGERS IV SOLN
INTRAVENOUS | Status: DC
  Administered 2016-05-25: 1000 mL via INTRAVENOUS
  Administered 2016-05-25: 13:00:00 via INTRAVENOUS

## 2016-05-25 MED ORDER — HYDROCODONE-ACETAMINOPHEN 5-325 MG PO TABS: 1.0000 | ORAL_TABLET | Freq: Four times a day (QID) | ORAL | 0 refills | Status: DC | PRN

## 2016-05-25 MED ORDER — ONDANSETRON HCL 4 MG/2ML IJ SOLN
INTRAMUSCULAR | Status: DC | PRN
  Administered 2016-05-25: 4 mg via INTRAVENOUS

## 2016-05-25 MED ORDER — MIDAZOLAM HCL 5 MG/5ML IJ SOLN
INTRAMUSCULAR | Status: DC | PRN
  Administered 2016-05-25: 2 mg via INTRAVENOUS

## 2016-05-25 MED ORDER — SODIUM CHLORIDE 0.9 % IR SOLN
Status: DC | PRN
  Administered 2016-05-25: 1000 mL

## 2016-05-25 SURGICAL SUPPLY — 15 items
BAG URO CATCHER STRL LF (MISCELLANEOUS) ×3 IMPLANT
CATH INTERMIT  6FR 70CM (CATHETERS) ×3 IMPLANT
CLOTH BEACON ORANGE TIMEOUT ST (SAFETY) ×3 IMPLANT
GLOVE BIOGEL M STRL SZ7.5 (GLOVE) ×3 IMPLANT
GLOVE BIOGEL PI IND STRL 6.5 (GLOVE) IMPLANT
GLOVE BIOGEL PI IND STRL 7.0 (GLOVE) IMPLANT
GLOVE BIOGEL PI INDICATOR 6.5 (GLOVE) ×2
GLOVE BIOGEL PI INDICATOR 7.0 (GLOVE) ×2
GOWN STRL REUS W/TWL LRG LVL3 (GOWN DISPOSABLE) ×6 IMPLANT
GUIDEWIRE STR DUAL SENSOR (WIRE) ×1 IMPLANT
MANIFOLD NEPTUNE II (INSTRUMENTS) ×3 IMPLANT
PACK CYSTO (CUSTOM PROCEDURE TRAY) ×3 IMPLANT
SYRINGE 60CC LL (MISCELLANEOUS) ×2 IMPLANT
TUBING CONNECTING 10 (TUBING) ×2 IMPLANT
TUBING CONNECTING 10' (TUBING) ×1

## 2016-05-25 NOTE — Op Note (Signed)
Preoperative diagnosis:  1. History of high risk non-muscle invasive bladder cancer 2.  Suspicious cytology  Postoperative diagnosis: 1. History of high risk non-muscle invasive bladder cancer 2.  Suspicious cytology  Procedure(s): 1. Cystoscopy 2.  Bilateral retrograde pyelography with interpretation 3.  Bladder biopsies 4  Surgeon: Dr. Roxy Horseman, Jr  Anesthesia: General  Complications:  none  EBL: minimal  Specimens:  1.  Bladder washing for cytology 2.  Right lateral bladder wall biopsy 3.  Biopsy of bladder trigone 4.  Left lateral bladder wall biopsy 5.  Posterior bladder biopsy  Disposition of specimens: pathology  Intraoperative findings: retrograde pyelography was performed with a 6 French ureteral catheter and Omnipaque contrast.  This demonstrated no filling defects within the ureters bilaterally or renal collecting systems bilaterally.  Indication: [name] is a 70 year old gentleman with a history of high-risk non-muscle invasive bladder cancer.  He has been receiving surveillance and intravesical BCG.  Recently he underwent an office cystoscopy that was unremarkable.  However, a bladder washing was obtained for cytology and was suspicious for high-grade malignancy.  He follows up today to undergo further evaluation and investigation of this finding.  He plans to proceed with the above procedures.  The potential risks, complications, and expected recovery process associated with these procedures was discussed in detail.  He gives informed consent.  Description of procedure:  He was taken to the operating room and a general anesthetic was administered.  He was given preoperative antibiotics, placed in the dorsal lithotomy position, and prepped and draped in the usual sterile fashion.  A preoperative timeout was performed.  Cystourethroscopy was then performed with a 22 French cystoscope.  Evaluation of the urethra including the prostatic urethra was  unremarkable.  Inspection of the bladder was performed with both a 30 and 70 lens.  No bladder tumors, stones, or other suspicious mucosal pathology was identified.  A saline bladder washing was obtained.  Bilateral retrograde pyelography was then performed with 6 French ureteral catheters.  The left ureteral orifice was intubated first and Omnipaque contrast was injected with findings as dictated above.  An identical procedure was performed on contralateral side.  No suspicious abnormalities were identified on either side.  Attention then returned to the bladder.  Bladder biopsies were obtained with a cold cup biopsy forcep from the left lateral wall, right lateral wall, trigone, and posterior bladder.  The site selected biopsies were sent for permanent pathologic analysis.  Each site was then fulgurated with a Bugbee electrode.  The bladder was emptied and reinspected and hemostasis appeared excellent.  The bladder was emptied and the procedure was ended.  He tolerated the procedure well without complications.  He was able to be awakened and transferred to the recovery unit in satisfactory condition.

## 2016-05-25 NOTE — Progress Notes (Signed)
O.K. To go to Short Stay at 1350-per Dr. Zenia Resides

## 2016-05-25 NOTE — Discharge Instructions (Signed)
General Anesthesia, Adult, Care After These instructions provide you with information about caring for yourself after your procedure. Your health care provider may also give you more specific instructions. Your treatment has been planned according to current medical practices, but problems sometimes occur. Call your health care provider if you have any problems or questions after your procedure. What can I expect after the procedure? After the procedure, it is common to have: Vomiting. A sore throat. Mental slowness. It is common to feel: Nauseous. Cold or shivery. Sleepy. Tired. Sore or achy, even in parts of your body where you did not have surgery. Follow these instructions at home: For at least 24 hours after the procedure: Do not: Participate in activities where you could fall or become injured. Drive. Use heavy machinery. Drink alcohol. Take sleeping pills or medicines that cause drowsiness. Make important decisions or sign legal documents. Take care of children on your own. Rest. Eating and drinking If you vomit, drink water, juice, or soup when you can drink without vomiting. Drink enough fluid to keep your urine clear or pale yellow. Make sure you have little or no nausea before eating solid foods. Follow the diet recommended by your health care provider. General instructions Have a responsible adult stay with you until you are awake and alert. Return to your normal activities as told by your health care provider. Ask your health care provider what activities are safe for you. Take over-the-counter and prescription medicines only as told by your health care provider. If you smoke, do not smoke without supervision. Keep all follow-up visits as told by your health care provider. This is important. Contact a health care provider if: You continue to have nausea or vomiting at home, and medicines are not helpful. You cannot drink fluids or start eating again. You cannot  urinate after 8-12 hours. You develop a skin rash. You have fever. You have increasing redness at the site of your procedure. Get help right away if: You have difficulty breathing. You have chest pain. You have unexpected bleeding. You feel that you are having a life-threatening or urgent problem. This information is not intended to replace advice given to you by your health care provider. Make sure you discuss any questions you have with your health care provider. Document Released: 09/28/2000 Document Revised: 11/25/2015 Document Reviewed: 06/06/2015 Elsevier Interactive Patient Education  2017 [place] may see some blood in the urine and may have some burning with urination for 48-72 hours. You also may notice that you have to urinate more frequently or urgently after your procedure which is normal.  2. You should call should you develop an inability urinate, fever > 101, persistent nausea and vomiting that prevents you from eating or drinking to stay hydrated.  3. Hold aspirin, meloxican, and other vitamins or supplements for 48 hrs or until urine is clear with no blood.

## 2016-05-25 NOTE — Anesthesia Postprocedure Evaluation (Signed)
Anesthesia Post Note  Patient: KASTIEL GADBOIS  Procedure(s) Performed: Procedure(s) (LRB): CYSTOSCOPY WITH RETROGRADE PYELOGRAM, BLADDER BIOPSIES (N/A)  Patient location during evaluation: PACU Anesthesia Type: General Level of consciousness: awake and alert Pain management: pain level controlled Vital Signs Assessment: post-procedure vital signs reviewed and stable Respiratory status: spontaneous breathing, nonlabored ventilation and respiratory function stable Cardiovascular status: blood pressure returned to baseline and stable Postop Assessment: no signs of nausea or vomiting Anesthetic complications: no    Last Vitals:  Vitals:   05/25/16 1345 05/25/16 1359  BP: 136/78 (!) 141/84  Pulse: (!) 50 (!) 53  Resp: 12 12  Temp: 36.7 C 36.8 C    Last Pain:  Vitals:   05/25/16 1401  TempSrc:   PainSc: 3                  Nilda Simmer

## 2016-05-25 NOTE — Transfer of Care (Signed)
Immediate Anesthesia Transfer of Care Note  Patient: Mason Scott  Procedure(s) Performed: Procedure(s): CYSTOSCOPY WITH RETROGRADE PYELOGRAM, BLADDER BIOPSIES (N/A)  Patient Location: PACU  Anesthesia Type:General  Level of Consciousness:  sedated, patient cooperative and responds to stimulation  Airway & Oxygen Therapy:Patient Spontanous Breathing and Patient connected to face mask oxgen  Post-op Assessment:  Report given to PACU RN and Post -op Vital signs reviewed and stable  Post vital signs:  Reviewed and stable  Last Vitals:  Vitals:   05/25/16 1006  BP: (!) 145/77  Pulse: (!) 51  Resp: 16  Temp: [id number] C    Complications: No apparent anesthesia complications

## 2016-05-25 NOTE — Anesthesia Preprocedure Evaluation (Addendum)
Anesthesia Evaluation  Patient identified by MRN, date of birth, ID band Patient awake    Reviewed: Allergy & Precautions, NPO status , Patient's Chart, lab work & pertinent test results  History of Anesthesia Complications Negative for: history of anesthetic complications  Airway Mallampati: III  TM Distance: >3 FB Neck ROM: Full    Dental  (+)    Pulmonary neg shortness of breath, sleep apnea and Continuous Positive Airway Pressure Ventilation , neg COPD, neg recent URI,    Pulmonary exam normal breath sounds clear to auscultation       Cardiovascular hypertension, Pt. on medications (-) angina(-) Past MI, (-) Cardiac Stents, (-) CABG, (-) Orthopnea, (-) PND and (-) DOE (-) dysrhythmias  Rhythm:Regular Rate:Normal  HLD   Neuro/Psych neg Seizures Polio as a child    GI/Hepatic negative GI ROS,   Endo/Other  negative endocrine ROS  Renal/GU CRFRenal disease (has had partial left and right nephrectomies)     Musculoskeletal  (+) Arthritis ,   Abdominal   Peds  Hematology negative hematology ROS (+)   Anesthesia Other Findings H/o bladder cancer 1991, h/o prostate cancer  Reproductive/Obstetrics                            Anesthesia Physical Anesthesia Plan  ASA: III  Anesthesia Plan: General   Post-op Pain Management:    Induction: Intravenous  Airway Management Planned: LMA  Additional Equipment:   Intra-op Plan:   Post-operative Plan: Extubation in OR  Informed Consent: I have reviewed the patients History and Physical, chart, labs and discussed the procedure including the risks, benefits and alternatives for the proposed anesthesia with the patient or authorized representative who has indicated his/her understanding and acceptance.   Dental advisory given  Plan Discussed with: CRNA  Anesthesia Plan Comments: (Risks of general anesthesia discussed including, but not  limited to, sore throat, hoarse voice, chipped/damaged teeth, injury to vocal cords, nausea and vomiting, allergic reactions, lung infection, heart attack, stroke, and death. All questions answered. )       Anesthesia Quick Evaluation

## 2016-05-25 NOTE — Anesthesia Procedure Notes (Signed)
Procedure Name: LMA Insertion Performed by: Clif Serio J Pre-anesthesia Checklist: Patient identified, Emergency Drugs available, Suction available, Patient being monitored and Timeout performed Patient Re-evaluated:Patient Re-evaluated prior to inductionOxygen Delivery Method: Circle system utilized Preoxygenation: Pre-oxygenation with 100% oxygen Intubation Type: IV induction Ventilation: Mask ventilation without difficulty LMA: LMA inserted LMA Size: 4.0 Number of attempts: 1 Placement Confirmation: positive ETCO2,  CO2 detector and breath sounds checked- equal and bilateral Tube secured with: Tape Dental Injury: Teeth and Oropharynx as per pre-operative assessment        

## 2016-05-25 NOTE — Interval H&P Note (Signed)
History and Physical Interval Note:  05/25/2016 11:26 AM  Mason Scott  has presented today for surgery, with the diagnosis of BLADDER CANCER  The various methods of treatment have been discussed with the patient and family. After consideration of risks, benefits and other options for treatment, the patient has consented to  Procedure(s): CYSTOSCOPY WITH RETROGRADE PYELOGRAM, BLADDER BIOPSIES WITH POSSIBLE BIOPSY OF PROSTATIC URETHRA (N/A) as a surgical intervention .  The patient's history has been reviewed, patient examined, no change in status, stable for surgery.  I have reviewed the patient's chart and labs.  Questions were answered to the patient's satisfaction.     Mason Scott,LES

## 2016-05-26 ENCOUNTER — Encounter (HOSPITAL_COMMUNITY): Payer: Self-pay | Admitting: Urology

## 2016-12-01 ENCOUNTER — Ambulatory Visit: Payer: Self-pay | Admitting: Surgery

## 2016-12-28 NOTE — Progress Notes (Signed)
05-22-16 (EPIC) EKG

## 2016-12-28 NOTE — Patient Instructions (Addendum)
[facility]  12/28/2016   Your procedure is scheduled on: 01-15-17  Report to [facility]  elevators to 3rd floor to [place] at 5:30 AM.   Call this number if you have problems the morning of surgery [phone]    Remember: ONLY 1 PERSON MAY GO WITH YOU TO SHORT STAY TO GET  READY MORNING OF [place].  Do not eat food or drink liquids :After Midnight.     Take these medicines the morning of surgery with A SIP OF WATER: None                                You may not have any metal on your body including hair pins and              piercings  Do not wear jewelry, make-up, lotions, powders or perfumes, deodorant                           Men may shave face and neck.   Do not bring valuables to the hospital. [place].  Contacts, dentures or bridgework may not be worn into surgery.  Leave suitcase in the car. After surgery it may be brought to your room.       Special Instructions: Please bring mask and tubing for your CPAP Machine              Please read over the following fact sheets you were given: _____________________________________________________________________             [facility] - Preparing for Surgery Before surgery, you can play an important role.  Because skin is not sterile, your skin needs to be as free of germs as possible.  You can reduce the number of germs on your skin by washing with CHG (chlorahexidine gluconate) soap before surgery.  CHG is an antiseptic cleaner which kills germs and bonds with the skin to continue killing germs even after washing. Please DO NOT use if you have an allergy to CHG or antibacterial soaps.  If your skin becomes reddened/irritated stop using the CHG and inform your nurse when you arrive at Short Stay. Do not shave (including legs and underarms) for at least 48 hours prior to the first CHG shower.  You may  shave your face/neck. Please follow these instructions carefully:  1.  Shower with CHG Soap the night before surgery and the  morning of Surgery.  2.  If you choose to wash your hair, wash your hair first as usual with your  normal  shampoo.  3.  After you shampoo, rinse your hair and body thoroughly to remove the  shampoo.                           4.  Use CHG as you would any other liquid soap.  You can apply chg directly  to the skin and wash                       Gently with a scrungie or clean washcloth.  5.  Apply the CHG Soap to  your body ONLY FROM THE NECK DOWN.   Do not use on face/ open                           Wound or open sores. Avoid contact with eyes, ears mouth and genitals (private parts).                       Wash face,  Genitals (private parts) with your normal soap.             6.  Wash thoroughly, paying special attention to the area where your surgery  will be performed.  7.  Thoroughly rinse your body with warm water from the neck down.  8.  DO NOT shower/wash with your normal soap after using and rinsing off  the CHG Soap.                9.  Pat yourself dry with a clean towel.            10.  Wear clean pajamas.            11.  Place clean sheets on your bed the night of your first shower and do not  sleep with pets. Day of Surgery : Do not apply any lotions/deodorants the morning of surgery.  Please wear clean clothes to the hospital/surgery center.  FAILURE TO FOLLOW THESE INSTRUCTIONS MAY RESULT IN THE CANCELLATION OF YOUR SURGERY PATIENT SIGNATURE_________________________________  NURSE SIGNATURE__________________________________  ________________________________________________________________________

## 2016-12-29 ENCOUNTER — Encounter (HOSPITAL_COMMUNITY)
Admission: RE | Admit: 2016-12-29 | Discharge: 2016-12-29 | Disposition: A | Discharge status: Home / Self Care | Payer: Medicare Other | Source: Ambulatory Visit | Attending: Surgery | Admitting: Surgery

## 2016-12-29 ENCOUNTER — Encounter (HOSPITAL_COMMUNITY): Payer: Self-pay

## 2016-12-29 DIAGNOSIS — K432 Incisional hernia without obstruction or gangrene: Secondary | ICD-10-CM | POA: Diagnosis not present

## 2016-12-29 DIAGNOSIS — Z01812 Encounter for preprocedural laboratory examination: Secondary | ICD-10-CM | POA: Insufficient documentation

## 2016-12-29 LAB — BASIC METABOLIC PANEL
Anion gap: 7 (ref 5–15)
BUN: 18 mg/dL (ref 6–20)
CALCIUM: 8.6 mg/dL — AB (ref 8.9–10.3)
CHLORIDE: 104 mmol/L (ref 101–111)
CO2: 28 mmol/L (ref 22–32)
CREATININE: 1.18 mg/dL (ref 0.61–1.24)
GFR calc non Af Amer: >60 mL/min (ref >60)
Glucose, Bld: 99 mg/dL (ref 65–99)
Potassium: 3.9 mmol/L (ref 3.5–5.1)
Sodium: 139 mmol/L (ref 135–145)

## 2016-12-29 LAB — CBC
HCT: 38.6 % — ABNORMAL LOW (ref 39.0–52.0)
Hemoglobin: 13.4 g/dL (ref 13.0–17.0)
MCH: 31.8 pg (ref 26.0–34.0)
MCHC: 34.7 g/dL (ref 30.0–36.0)
MCV: 91.5 fL (ref 78.0–100.0)
PLATELETS: 197 10*3/uL (ref 150–400)
RBC: 4.22 MIL/uL (ref 4.22–5.81)
RDW: 12.6 % (ref 11.5–15.5)
WBC: 10.2 10*3/uL (ref 4.0–10.5)

## 2017-01-15 ENCOUNTER — Encounter (HOSPITAL_COMMUNITY)
Admission: RE | Disposition: A | Discharge status: Home / Self Care | Payer: Self-pay | Source: Ambulatory Visit | Attending: Surgery

## 2017-01-15 ENCOUNTER — Observation Stay (HOSPITAL_COMMUNITY)
Admission: RE | Admit: 2017-01-15 | Discharge: 2017-01-16 | Disposition: A | Discharge status: Home / Self Care | Payer: Medicare Other | Source: Ambulatory Visit | Attending: Surgery | Admitting: Surgery

## 2017-01-15 ENCOUNTER — Ambulatory Visit (HOSPITAL_COMMUNITY): Payer: Medicare Other | Admitting: Anesthesiology

## 2017-01-15 ENCOUNTER — Encounter (HOSPITAL_COMMUNITY): Payer: Self-pay

## 2017-01-15 DIAGNOSIS — Z79899 Other long term (current) drug therapy: Secondary | ICD-10-CM | POA: Diagnosis not present

## 2017-01-15 DIAGNOSIS — Z9989 Dependence on other enabling machines and devices: Secondary | ICD-10-CM | POA: Diagnosis not present

## 2017-01-15 DIAGNOSIS — Z791 Long term (current) use of non-steroidal anti-inflammatories (NSAID): Secondary | ICD-10-CM | POA: Diagnosis not present

## 2017-01-15 DIAGNOSIS — K432 Incisional hernia without obstruction or gangrene: Secondary | ICD-10-CM | POA: Diagnosis not present

## 2017-01-15 DIAGNOSIS — Z905 Acquired absence of kidney: Secondary | ICD-10-CM | POA: Insufficient documentation

## 2017-01-15 DIAGNOSIS — G4733 Obstructive sleep apnea (adult) (pediatric): Secondary | ICD-10-CM | POA: Diagnosis not present

## 2017-01-15 DIAGNOSIS — I129 Hypertensive chronic kidney disease with stage 1 through stage 4 chronic kidney disease, or unspecified chronic kidney disease: Secondary | ICD-10-CM | POA: Diagnosis not present

## 2017-01-15 DIAGNOSIS — E785 Hyperlipidemia, unspecified: Secondary | ICD-10-CM | POA: Diagnosis present

## 2017-01-15 DIAGNOSIS — N189 Chronic kidney disease, unspecified: Secondary | ICD-10-CM | POA: Diagnosis present

## 2017-01-15 DIAGNOSIS — I10 Essential (primary) hypertension: Secondary | ICD-10-CM | POA: Diagnosis present

## 2017-01-15 HISTORY — PX: VENTRAL HERNIA REPAIR: SHX424

## 2017-01-15 HISTORY — PX: INSERTION OF MESH: SHX5868

## 2017-01-15 SURGERY — REPAIR, HERNIA, VENTRAL, LAPAROSCOPIC
Anesthesia: General

## 2017-01-15 MED ORDER — HYDRALAZINE HCL 20 MG/ML IJ SOLN
5.0000 mg | INTRAMUSCULAR | Status: DC | PRN
  Filled 2017-01-15: qty 1

## 2017-01-15 MED ORDER — HYDROMORPHONE HCL-NACL 0.5-0.9 MG/ML-% IV SOSY
0.5000 mg | PREFILLED_SYRINGE | INTRAVENOUS | Status: DC | PRN
  Administered 2017-01-15: 0.5 mg via INTRAVENOUS
  Administered 2017-01-16: 1 mg via INTRAVENOUS
  Filled 2017-01-15: qty 2
  Filled 2017-01-15: qty 1

## 2017-01-15 MED ORDER — HYDROCHLOROTHIAZIDE 12.5 MG PO CAPS
12.5000 mg | ORAL_CAPSULE | Freq: Every day | ORAL | Status: DC
  Administered 2017-01-15: 12.5 mg via ORAL

## 2017-01-15 MED ORDER — BUPIVACAINE LIPOSOME 1.3 % IJ SUSP
INTRAMUSCULAR | Status: DC | PRN
  Administered 2017-01-15: 20 mL

## 2017-01-15 MED ORDER — BUPIVACAINE HCL (PF) 0.25 % IJ SOLN
INTRAMUSCULAR | Status: AC
  Filled 2017-01-15: qty 60

## 2017-01-15 MED ORDER — CEFAZOLIN SODIUM-DEXTROSE 2-4 GM/100ML-% IV SOLN
INTRAVENOUS | Status: AC
  Filled 2017-01-15: qty 100

## 2017-01-15 MED ORDER — PROPOFOL 10 MG/ML IV BOLUS
INTRAVENOUS | Status: DC | PRN
  Administered 2017-01-15: 200 mg via INTRAVENOUS

## 2017-01-15 MED ORDER — SUCCINYLCHOLINE CHLORIDE 200 MG/10ML IV SOSY
PREFILLED_SYRINGE | INTRAVENOUS | Status: AC
  Filled 2017-01-15: qty 10

## 2017-01-15 MED ORDER — SUGAMMADEX SODIUM 200 MG/2ML IV SOLN
INTRAVENOUS | Status: AC
  Filled 2017-01-15: qty 2

## 2017-01-15 MED ORDER — MIDAZOLAM HCL 5 MG/5ML IJ SOLN
INTRAMUSCULAR | Status: DC | PRN
  Administered 2017-01-15: 2 mg via INTRAVENOUS

## 2017-01-15 MED ORDER — OMEGA-3-ACID ETHYL ESTERS 1 G PO CAPS
1.0000 g | ORAL_CAPSULE | Freq: Every day | ORAL | Status: DC
  Administered 2017-01-15: 1 g via ORAL
  Filled 2017-01-15: qty 1

## 2017-01-15 MED ORDER — SODIUM CHLORIDE 0.9% FLUSH: 3.0000 mL | Freq: Two times a day (BID) | INTRAVENOUS | Status: DC

## 2017-01-15 MED ORDER — GABAPENTIN 300 MG PO CAPS
300.0000 mg | ORAL_CAPSULE | ORAL | Status: AC
  Administered 2017-01-15: 300 mg via ORAL
  Filled 2017-01-15: qty 1

## 2017-01-15 MED ORDER — EPHEDRINE SULFATE-NACL 50-0.9 MG/10ML-% IV SOSY
PREFILLED_SYRINGE | INTRAVENOUS | Status: DC | PRN
  Administered 2017-01-15 (×2): 10 mg via INTRAVENOUS

## 2017-01-15 MED ORDER — BUPIVACAINE-EPINEPHRINE (PF) 0.25% -1:200000 IJ SOLN
INTRAMUSCULAR | Status: DC | PRN
  Administered 2017-01-15 (×2): 30 mL

## 2017-01-15 MED ORDER — SODIUM CHLORIDE 0.9 % IV SOLN: 250.0000 mL | INTRAVENOUS | Status: DC | PRN

## 2017-01-15 MED ORDER — FENTANYL CITRATE (PF) 100 MCG/2ML IJ SOLN: 25.0000 ug | INTRAMUSCULAR | Status: DC | PRN

## 2017-01-15 MED ORDER — MIDAZOLAM HCL 2 MG/2ML IJ SOLN
INTRAMUSCULAR | Status: AC
  Filled 2017-01-15: qty 2

## 2017-01-15 MED ORDER — DIPHENHYDRAMINE HCL 12.5 MG/5ML PO ELIX: 12.5000 mg | ORAL_SOLUTION | Freq: Four times a day (QID) | ORAL | Status: DC | PRN

## 2017-01-15 MED ORDER — CHLORHEXIDINE GLUCONATE CLOTH 2 % EX PADS: 6.0000 | MEDICATED_PAD | Freq: Once | CUTANEOUS | Status: DC

## 2017-01-15 MED ORDER — FENTANYL CITRATE (PF) 100 MCG/2ML IJ SOLN
INTRAMUSCULAR | Status: AC
  Filled 2017-01-15: qty 2

## 2017-01-15 MED ORDER — DEXAMETHASONE SODIUM PHOSPHATE 10 MG/ML IJ SOLN
INTRAMUSCULAR | Status: DC | PRN
  Administered 2017-01-15: 10 mg via INTRAVENOUS

## 2017-01-15 MED ORDER — LIDOCAINE 2% (20 MG/ML) 5 ML SYRINGE
INTRAMUSCULAR | Status: AC
Start: 2017-01-15 — End: 2017-01-15
  Filled 2017-01-15: qty 10

## 2017-01-15 MED ORDER — METHOCARBAMOL 500 MG PO TABS: 1000.0000 mg | ORAL_TABLET | Freq: Four times a day (QID) | ORAL | Status: DC | PRN

## 2017-01-15 MED ORDER — SODIUM CHLORIDE 0.9% FLUSH: 3.0000 mL | INTRAVENOUS | Status: DC | PRN

## 2017-01-15 MED ORDER — EPHEDRINE 5 MG/ML INJ
INTRAVENOUS | Status: AC
  Filled 2017-01-15: qty 10

## 2017-01-15 MED ORDER — METHOCARBAMOL 750 MG PO TABS: 750.0000 mg | ORAL_TABLET | Freq: Four times a day (QID) | ORAL | 2 refills | Status: DC | PRN

## 2017-01-15 MED ORDER — PROPOFOL 10 MG/ML IV BOLUS
INTRAVENOUS | Status: AC
  Filled 2017-01-15: qty 20

## 2017-01-15 MED ORDER — BISACODYL 10 MG RE SUPP: 10.0000 mg | Freq: Every day | RECTAL | Status: DC | PRN

## 2017-01-15 MED ORDER — SIMETHICONE 80 MG PO CHEW: 40.0000 mg | CHEWABLE_TABLET | Freq: Four times a day (QID) | ORAL | Status: DC | PRN

## 2017-01-15 MED ORDER — ACETAMINOPHEN 500 MG PO TABS
1000.0000 mg | ORAL_TABLET | ORAL | Status: AC
  Administered 2017-01-15: 1000 mg via ORAL
  Filled 2017-01-15: qty 2

## 2017-01-15 MED ORDER — DEXAMETHASONE SODIUM PHOSPHATE 10 MG/ML IJ SOLN
INTRAMUSCULAR | Status: AC
  Filled 2017-01-15: qty 1

## 2017-01-15 MED ORDER — MAGIC MOUTHWASH
15.0000 mL | Freq: Four times a day (QID) | ORAL | Status: DC | PRN
  Filled 2017-01-15: qty 15

## 2017-01-15 MED ORDER — BUPIVACAINE LIPOSOME 1.3 % IJ SUSP
20.0000 mL | INTRAMUSCULAR | Status: DC
  Filled 2017-01-15: qty 20

## 2017-01-15 MED ORDER — SUCCINYLCHOLINE CHLORIDE 200 MG/10ML IV SOSY
PREFILLED_SYRINGE | INTRAVENOUS | Status: DC | PRN
  Administered 2017-01-15: 20 mg via INTRAVENOUS

## 2017-01-15 MED ORDER — SIMVASTATIN 10 MG PO TABS
5.0000 mg | ORAL_TABLET | Freq: Every evening | ORAL | Status: DC
  Administered 2017-01-15: 5 mg via ORAL
  Filled 2017-01-15: qty 1

## 2017-01-15 MED ORDER — LORAZEPAM 2 MG/ML IJ SOLN: 0.5000 mg | Freq: Three times a day (TID) | INTRAMUSCULAR | Status: DC | PRN

## 2017-01-15 MED ORDER — ONDANSETRON 4 MG PO TBDP: 4.0000 mg | ORAL_TABLET | Freq: Four times a day (QID) | ORAL | Status: DC | PRN

## 2017-01-15 MED ORDER — ENOXAPARIN SODIUM 40 MG/0.4ML ~~LOC~~ SOLN
40.0000 mg | SUBCUTANEOUS | Status: DC
  Administered 2017-01-16: 40 mg via SUBCUTANEOUS
  Filled 2017-01-15: qty 0.4

## 2017-01-15 MED ORDER — HYDROCORTISONE 1 % EX CREA
1.0000 "application " | TOPICAL_CREAM | Freq: Three times a day (TID) | CUTANEOUS | Status: DC | PRN
  Filled 2017-01-15: qty 28

## 2017-01-15 MED ORDER — KETAMINE HCL 10 MG/ML IJ SOLN
INTRAMUSCULAR | Status: DC | PRN
  Administered 2017-01-15: 30 mg via INTRAVENOUS
  Administered 2017-01-15: 20 mg via INTRAVENOUS

## 2017-01-15 MED ORDER — ASPIRIN EC 81 MG PO TBEC
81.0000 mg | DELAYED_RELEASE_TABLET | Freq: Every day | ORAL | Status: DC
  Administered 2017-01-15: 81 mg via ORAL

## 2017-01-15 MED ORDER — VALSARTAN-HYDROCHLOROTHIAZIDE 160-25 MG PO TABS: 0.5000 | ORAL_TABLET | Freq: Every morning | ORAL | Status: DC

## 2017-01-15 MED ORDER — PHENAZOPYRIDINE HCL 100 MG PO TABS
100.0000 mg | ORAL_TABLET | Freq: Three times a day (TID) | ORAL | Status: DC | PRN
  Filled 2017-01-15: qty 1

## 2017-01-15 MED ORDER — IRBESARTAN 75 MG PO TABS
75.0000 mg | ORAL_TABLET | Freq: Every day | ORAL | Status: DC
  Administered 2017-01-15: 75 mg via ORAL

## 2017-01-15 MED ORDER — ROCURONIUM BROMIDE 50 MG/5ML IV SOSY
PREFILLED_SYRINGE | INTRAVENOUS | Status: AC
  Filled 2017-01-15: qty 5

## 2017-01-15 MED ORDER — METHOCARBAMOL 1000 MG/10ML IJ SOLN
1000.0000 mg | Freq: Four times a day (QID) | INTRAVENOUS | Status: DC | PRN
  Administered 2017-01-15: 1000 mg via INTRAVENOUS
  Filled 2017-01-15: qty 600

## 2017-01-15 MED ORDER — HYDROCODONE-ACETAMINOPHEN 10-325 MG PO TABS: 1.0000 | ORAL_TABLET | Freq: Four times a day (QID) | ORAL | 0 refills | Status: DC | PRN

## 2017-01-15 MED ORDER — DIPHENHYDRAMINE HCL 50 MG/ML IJ SOLN: 12.5000 mg | Freq: Four times a day (QID) | INTRAMUSCULAR | Status: DC | PRN

## 2017-01-15 MED ORDER — GUAIFENESIN-DM 100-10 MG/5ML PO SYRP: 10.0000 mL | ORAL_SOLUTION | ORAL | Status: DC | PRN

## 2017-01-15 MED ORDER — LIDOCAINE 2% (20 MG/ML) 5 ML SYRINGE
INTRAMUSCULAR | Status: DC | PRN
  Administered 2017-01-15: 100 mg via INTRAVENOUS

## 2017-01-15 MED ORDER — PROMETHAZINE HCL 25 MG/ML IJ SOLN
6.2500 mg | INTRAMUSCULAR | Status: DC | PRN
Start: 2017-01-15 — End: 2017-01-15

## 2017-01-15 MED ORDER — AMLODIPINE BESYLATE 10 MG PO TABS
10.0000 mg | ORAL_TABLET | Freq: Every evening | ORAL | Status: DC
  Administered 2017-01-15: 10 mg via ORAL
  Filled 2017-01-15: qty 1

## 2017-01-15 MED ORDER — POLYETHYLENE GLYCOL 3350 17 G PO PACK: 17.0000 g | PACK | Freq: Every day | ORAL | Status: DC | PRN

## 2017-01-15 MED ORDER — ONDANSETRON HCL 4 MG/2ML IJ SOLN
INTRAMUSCULAR | Status: DC | PRN
  Administered 2017-01-15: 4 mg via INTRAVENOUS

## 2017-01-15 MED ORDER — ONDANSETRON HCL 4 MG/2ML IJ SOLN: 4.0000 mg | Freq: Four times a day (QID) | INTRAMUSCULAR | Status: DC | PRN

## 2017-01-15 MED ORDER — ONDANSETRON HCL 4 MG/2ML IJ SOLN
INTRAMUSCULAR | Status: AC
  Filled 2017-01-15: qty 2

## 2017-01-15 MED ORDER — FENTANYL CITRATE (PF) 100 MCG/2ML IJ SOLN
INTRAMUSCULAR | Status: DC | PRN
  Administered 2017-01-15 (×2): 100 ug via INTRAVENOUS

## 2017-01-15 MED ORDER — LACTATED RINGERS IV SOLN: 1000.0000 mL | Freq: Three times a day (TID) | INTRAVENOUS | Status: DC | PRN

## 2017-01-15 MED ORDER — SCOPOLAMINE 1 MG/3DAYS TD PT72
MEDICATED_PATCH | TRANSDERMAL | Status: AC
  Filled 2017-01-15: qty 1

## 2017-01-15 MED ORDER — MENTHOL 3 MG MT LOZG: 1.0000 | LOZENGE | OROMUCOSAL | Status: DC | PRN

## 2017-01-15 MED ORDER — PHENOL 1.4 % MT LIQD: 1.0000 | OROMUCOSAL | Status: DC | PRN

## 2017-01-15 MED ORDER — FISH OIL OMEGA-3 1000 MG PO CAPS: 1.0000 | ORAL_CAPSULE | Freq: Every evening | ORAL | Status: DC

## 2017-01-15 MED ORDER — ROCURONIUM BROMIDE 10 MG/ML (PF) SYRINGE
PREFILLED_SYRINGE | INTRAVENOUS | Status: DC | PRN
  Administered 2017-01-15: 10 mg via INTRAVENOUS
  Administered 2017-01-15: 50 mg via INTRAVENOUS
  Administered 2017-01-15 (×2): 10 mg via INTRAVENOUS

## 2017-01-15 MED ORDER — LACTATED RINGERS IV SOLN
INTRAVENOUS | Status: DC | PRN
  Administered 2017-01-15 (×2): via INTRAVENOUS

## 2017-01-15 MED ORDER — SCOPOLAMINE 1 MG/3DAYS TD PT72
MEDICATED_PATCH | TRANSDERMAL | Status: DC | PRN
  Administered 2017-01-15: 1 via TRANSDERMAL

## 2017-01-15 MED ORDER — SUGAMMADEX SODIUM 200 MG/2ML IV SOLN
INTRAVENOUS | Status: DC | PRN
  Administered 2017-01-15: 200 mg via INTRAVENOUS

## 2017-01-15 MED ORDER — LIP MEDEX EX OINT
1.0000 "application " | TOPICAL_OINTMENT | Freq: Two times a day (BID) | CUTANEOUS | Status: DC
  Administered 2017-01-15 (×2): 1 via TOPICAL

## 2017-01-15 MED ORDER — GABAPENTIN 300 MG PO CAPS
300.0000 mg | ORAL_CAPSULE | Freq: Every day | ORAL | Status: DC
  Administered 2017-01-15: 300 mg via ORAL
  Filled 2017-01-15: qty 1

## 2017-01-15 MED ORDER — SODIUM CHLORIDE 0.9 % IV SOLN
INTRAVENOUS | Status: DC
  Administered 2017-01-15 – 2017-01-16 (×2): via INTRAVENOUS

## 2017-01-15 MED ORDER — MELOXICAM 7.5 MG PO TABS
7.5000 mg | ORAL_TABLET | Freq: Every evening | ORAL | Status: DC
  Administered 2017-01-15: 7.5 mg via ORAL
  Filled 2017-01-15 (×2): qty 1

## 2017-01-15 MED ORDER — ACETAMINOPHEN 500 MG PO TABS
1000.0000 mg | ORAL_TABLET | Freq: Three times a day (TID) | ORAL | Status: DC
  Administered 2017-01-15 – 2017-01-16 (×3): 1000 mg via ORAL
  Filled 2017-01-15 (×3): qty 2

## 2017-01-15 MED ORDER — HYDROCORTISONE 2.5 % RE CREA
1.0000 "application " | TOPICAL_CREAM | Freq: Four times a day (QID) | RECTAL | Status: DC | PRN
  Filled 2017-01-15: qty 28.35

## 2017-01-15 MED ORDER — ALUM & MAG HYDROXIDE-SIMETH 200-200-20 MG/5ML PO SUSP: 30.0000 mL | Freq: Four times a day (QID) | ORAL | Status: DC | PRN

## 2017-01-15 MED ORDER — BUPIVACAINE-EPINEPHRINE 0.25% -1:200000 IJ SOLN
INTRAMUSCULAR | Status: DC | PRN
  Administered 2017-01-15: 60 mL

## 2017-01-15 MED ORDER — KETAMINE HCL 10 MG/ML IJ SOLN
INTRAMUSCULAR | Status: AC
  Filled 2017-01-15: qty 1

## 2017-01-15 MED ORDER — CEFAZOLIN SODIUM-DEXTROSE 2-4 GM/100ML-% IV SOLN
2.0000 g | INTRAVENOUS | Status: AC
  Administered 2017-01-15: 2 g via INTRAVENOUS

## 2017-01-15 SURGICAL SUPPLY — 44 items
APPLIER CLIP 5 13 M/L LIGAMAX5 (MISCELLANEOUS)
APR CLP MED LRG 5 ANG JAW (MISCELLANEOUS)
BINDER ABDOMINAL 12 ML 46-62 (SOFTGOODS) ×2 IMPLANT
CABLE HIGH FREQUENCY MONO STRZ (ELECTRODE) ×3 IMPLANT
CHLORAPREP W/TINT 26ML (MISCELLANEOUS) ×3 IMPLANT
CLIP APPLIE 5 13 M/L LIGAMAX5 (MISCELLANEOUS) IMPLANT
CLOSURE WOUND 1/2 X4 (GAUZE/BANDAGES/DRESSINGS) ×1
COVER SURGICAL LIGHT HANDLE (MISCELLANEOUS) ×3 IMPLANT
DECANTER SPIKE VIAL GLASS SM (MISCELLANEOUS) ×1 IMPLANT
DEVICE SECURE STRAP 25 ABSORB (INSTRUMENTS) ×2 IMPLANT
DEVICE TROCAR PUNCTURE CLOSURE (ENDOMECHANICALS) ×3 IMPLANT
DRAPE WARM FLUID 44X44 (DRAPE) ×3 IMPLANT
DRSG TEGADERM 2-3/8X2-3/4 SM (GAUZE/BANDAGES/DRESSINGS) ×9 IMPLANT
DRSG TEGADERM 4X4.75 (GAUZE/BANDAGES/DRESSINGS) ×2 IMPLANT
ELECT PENCIL ROCKER SW 15FT (MISCELLANEOUS) ×2 IMPLANT
ELECT REM PT RETURN 15FT ADLT (MISCELLANEOUS) ×3 IMPLANT
GAUZE SPONGE 2X2 8PLY STRL LF (GAUZE/BANDAGES/DRESSINGS) IMPLANT
GLOVE ECLIPSE 8.0 STRL XLNG CF (GLOVE) ×3 IMPLANT
GLOVE INDICATOR 8.0 STRL GRN (GLOVE) ×3 IMPLANT
GOWN STRL REUS W/TWL XL LVL3 (GOWN DISPOSABLE) ×6 IMPLANT
IRRIG SUCT STRYKERFLOW 2 WTIP (MISCELLANEOUS)
IRRIGATION SUCT STRKRFLW 2 WTP (MISCELLANEOUS) IMPLANT
KIT BASIN OR (CUSTOM PROCEDURE TRAY) ×3 IMPLANT
MARKER SKIN DUAL TIP RULER LAB (MISCELLANEOUS) ×3 IMPLANT
MESH VENTRALIGHT ST 8X10 (Mesh General) ×2 IMPLANT
NDL SPNL 22GX3.5 QUINCKE BK (NEEDLE) IMPLANT
NEEDLE SPNL 22GX3.5 QUINCKE BK (NEEDLE) ×3 IMPLANT
PAD POSITIONING PINK XL (MISCELLANEOUS) ×3 IMPLANT
SCISSORS LAP 5X35 DISP (ENDOMECHANICALS) ×3 IMPLANT
SHEARS HARMONIC ACE PLUS 36CM (ENDOMECHANICALS) IMPLANT
SLEEVE XCEL OPT CAN 5 100 (ENDOMECHANICALS) ×6 IMPLANT
SPONGE GAUZE 2X2 STER 10/PKG (GAUZE/BANDAGES/DRESSINGS)
STRIP CLOSURE SKIN 1/2X4 (GAUZE/BANDAGES/DRESSINGS) ×3 IMPLANT
SUT MNCRL AB 4-0 PS2 18 (SUTURE) ×3 IMPLANT
SUT PDS AB 1 CT1 27 (SUTURE) ×22 IMPLANT
SUT PROLENE 1 CT 1 30 (SUTURE) ×24 IMPLANT
SUT VIC AB 2-0 SH 18 (SUTURE) ×2 IMPLANT
SUT VIC AB 2-0 SH 27 (SUTURE) ×3
SUT VIC AB 2-0 SH 27X BRD (SUTURE) IMPLANT
TOWEL OR 17X26 10 PK STRL BLUE (TOWEL DISPOSABLE) ×3 IMPLANT
TRAY LAPAROSCOPIC (CUSTOM PROCEDURE TRAY) ×3 IMPLANT
TROCAR BLADELESS OPT 5 100 (ENDOMECHANICALS) ×3 IMPLANT
TROCAR XCEL NON-BLD 11X100MML (ENDOMECHANICALS) IMPLANT
TUBING INSUF HEATED (TUBING) ×3 IMPLANT

## 2017-01-15 NOTE — Anesthesia Procedure Notes (Signed)
Anesthesia Regional Block: TAP block   Pre-Anesthetic Checklist: ,, timeout performed, Correct Patient, Correct Site, Correct Laterality, Correct Procedure, Correct Position, site marked, Risks and benefits discussed,  Surgical consent,  Pre-op evaluation,  At surgeon's request and post-op pain management  Laterality: Right  Prep: chloraprep       Needles:  Injection technique: Single-shot  Needle Type: Echogenic Needle     Needle Length: 9cm  Needle Gauge: 21     Additional Needles:   Procedures: ultrasound guided,,,,,,,,  Narrative:  Start time: 01/15/2017 7:17 AM End time: 01/15/2017 7:25 AM Injection made incrementally with aspirations every 5 mL.  Performed by: Personally  Anesthesiologist: Suzette Battiest

## 2017-01-15 NOTE — Progress Notes (Signed)
Pt. placed on CPAP with own nasal pillows, humidity filled, pt. on room air, tolerating well, RT to monitor.

## 2017-01-15 NOTE — Transfer of Care (Signed)
Immediate Anesthesia Transfer of Care Note  Patient: Mason Scott  Procedure(s) Performed: Procedure(s) with comments: LAPAROSCOPIC VENTRAL HERNIA REPAIR WITH MESH (N/A) - ERAS PATHWAY BILATERAL TAP BLOCK INSERTION OF MESH (N/A)  Patient Location: PACU  Anesthesia Type:General  Level of Consciousness: sedated  Airway & Oxygen Therapy: Patient Spontanous Breathing and Patient connected to face mask oxygen  Post-op Assessment: Report given to RN and Post -op Vital signs reviewed and stable  Post vital signs: Reviewed and stable  Last Vitals:  Vitals:   01/15/17 0539  BP: 139/72  Pulse: (!) 48  Resp: 18  Temp: 36.7 C    Last Pain:  Vitals:   01/15/17 0539  TempSrc: Oral      Patients Stated Pain Goal: 4 ([id number])  Complications: No apparent anesthesia complications

## 2017-01-15 NOTE — Op Note (Addendum)
01/15/2017  PATIENT:  Mason Scott  71 y.o. male  Patient Care Team: Sharilyn Sites, MD as PCP - General (Family Medicine) Michael Boston, MD as Consulting Physician (General Surgery) Raynelle Bring, MD as Consulting Physician (Urology)  PRE-OPERATIVE DIAGNOSIS:  Incisional abdominal wall hernia  POST-OPERATIVE DIAGNOSIS:  Incisional abdominal wall hernia  PROCEDURE:  Procedure(s): LAPAROSCOPIC VENTRAL HERNIA REPAIR WITH MESH INSERTION OF MESH  LAPAROSCOPIC REPAIR OF  Incisional New abdominal wall hernia(s)  HERNIA WITH MESH  SURGEON:  Adin Hector, MD  ASSISTANT: Nurse   ANESTHESIA:     General  Local anesthesia field block: (0.25% bupivacaine & liposomal  Bupivacaine [Experel])  EBL:  Total I/O In: 1100 [I.V.:1100] Out: -   Per anesthesia record  Delay start of Pharmacological VTE agent (>24hrs) due to surgical blood loss or risk of bleeding:  no  DRAINS: none   SPECIMEN:  No Specimen  DISPOSITION OF SPECIMEN:  N/A  COUNTS:  YES  PLAN OF CARE: Admit for overnight observation  PATIENT DISPOSITION:  PACU - hemodynamically stable.  INDICATION: Pleasant patient has developed a ventral wall abdominal hernia.   Recommendation was made for surgical repair:  The anatomy & physiology of the abdominal wall was discussed. The pathophysiology of hernias was discussed. Natural history risks without surgery including progeressive enlargement, pain, incarceration & strangulation was discussed. Contributors to complications such as smoking, obesity, diabetes, prior surgery, etc were discussed.  I feel the risks of no intervention will lead to serious problems that outweigh the operative risks; therefore, I recommended surgery to reduce and repair the hernia. I explained laparoscopic techniques with possible need for an open approach. I noted the probable use of mesh to patch and/or buttress the hernia repair  Risks such as bleeding, infection, abscess, need for further  treatment, heart attack, death, and other risks were discussed. I noted a good likelihood this will help address the problem. Goals of post-operative recovery were discussed as well. Possibility that this will not correct all symptoms was explained. I stressed the importance of low-impact activity, aggressive pain control, avoiding constipation, & not pushing through pain to minimize risk of post-operative chronic pain or injury. Possibility of reherniation especially with smoking, obesity, diabetes, immunosuppression, and other health conditions was discussed. We will work to minimize complications.  An educational handout further explaining the pathology & treatment options was given as well. Questions were answered. The patient expresses understanding & wishes to proceed with surgery.   OR FINDINGS: 6x5cm incisional hernia supraumbilical   Type of repair: Laparoscopic underlay repair   Placement of mesh: Intraperitoneal underlay repair  Name of mesh: Bard Ventralight dual sided (polypropylene / Seprafilm)  Size of mesh: 25x20cm  Orientation: Vertical  Mesh overlap:  5-7cm   DESCRIPTION:   Informed consent was confirmed. The patient underwent general anaesthesia without difficulty. The patient was positioned appropriately. VTE prevention in place. The patient's abdomen was clipped, prepped, & draped in a sterile fashion. Surgical timeout confirmed our plan.  The patient was positioned in reverse Trendelenburg. Abdominal entry was gained using optical entry technique in the left upper abdomen. Entry was clean. I induced carbon dioxide insufflation. Camera inspection revealed no injury. Extra ports were carefully placed under direct laparoscopic visualization.   I could see the hernia on the parietal peritoneum under the abdominal wall.   I did laparoscopic lysis of adhesions to expose the entire anterior abdominal wall.  I freed off the falciform ligament and umbilical ligaments on the  anterior abdominal wall.  Help reduce part of the hernia sac as well.  I primarily used focused sharp dissection.    I made sure hemostasis was good.  I mapped out the region using a needle passer.   To ensure that I would have at least 5 cm radial coverage outside of the hernia defect, I chose a 25x20cm dual sided mesh.  I placed #1 Prolene stitches around its edge about every 5 cm = 14 total.  I rolled the mesh & placed into the peritoneal cavity through the hernia defect.  I unrolled the mesh and positioned it appropriately.  I secured the mesh to cover up the hernia defect using a laparoscopic suture passer to pass the tails of the Prolene through the abdominal wall & tagged them with clamps for good transfascial suturing.  I started out in four corners to make sure I had the mesh centered under the hernia defect appropriately, and then proceeded to work in quadrants.    We evacuated CO2 & desufflated the abdomen.  I tied the fascial stitches down.  Patient had redundant stretched down hernia sac.  I excised part of this transversely to remove excess skin and hernia sac and mobilize skin flaps.  I closed the fascial defect that I placed the mesh through using #1 PDS interrupted transverse stitches primarily.  I reinsufflated the abdomen. The mesh provided at least circumferential coverage around the entire region of hernia defects.  This also helped cover up the umbilical region which was sewn what thinned out and diastatic.  No actual hernia there though.  I secured the mesh centrally with an additional trans fascial stitch in & out the mesh using #1 PDS under laparoscopic visualization x 2.   I tacked the edges & central part of the mesh to the peritoneum/posterior rectus fascia with SecureStrap absorbable tacks.   I did reinspection. Hemostasis was good. Mesh laid well. I completed a broad field block of local anesthesia at fascial stitch sites & fascial closure areas.    Capnoperitoneum was evacuated.  Ports were removed.  I excised excess skin around the enlarged hernia scan with a biconcave transverse incision.  I closed that wound in layers with some deep suture deep dermal and running 4-0 Monocryl suture.  The skin was closed with Monocryl at the port sites and Steri-Strips on the fascial stitch puncture sites.  Patient is extubated & in the recovery room.  I discussed operative findings, updated the patient's status, discussed probable steps to recovery, and gave postoperative recommendations to the patient's family.  Recommendations were made.  Questions were answered.  They expressed understanding & appreciation.  Adin Hector, M.D., F.A.C.S. Gastrointestinal and Minimally Invasive Surgery [facility] [address] [place], [place] [address] [phone] Main / Paging  01/15/2017 10:03 AM

## 2017-01-15 NOTE — Anesthesia Procedure Notes (Signed)
Anesthesia Regional Block: TAP block   Pre-Anesthetic Checklist: ,, timeout performed, Correct Patient, Correct Site, Correct Laterality, Correct Procedure, Correct Position, site marked, Risks and benefits discussed,  Surgical consent,  Pre-op evaluation,  At surgeon's request and post-op pain management  Laterality: Left  Prep: chloraprep       Needles:  Injection technique: Single-shot  Needle Type: Echogenic Needle     Needle Length: 9cm  Needle Gauge: 21     Additional Needles:   Procedures: ultrasound guided,,,,,,,,  Narrative:  Start time: 01/15/2017 7:26 AM End time: 01/15/2017 7:31 AM Injection made incrementally with aspirations every 5 mL.  Performed by: Personally  Anesthesiologist: Suzette Battiest

## 2017-01-15 NOTE — Anesthesia Preprocedure Evaluation (Addendum)
Anesthesia Evaluation  Patient identified by MRN, date of birth, ID band Patient awake    Airway Mallampati: II  TM Distance: >3 FB Neck ROM: Full    Dental  (+) Dental Advisory Given   Pulmonary sleep apnea and Continuous Positive Airway Pressure Ventilation ,    breath sounds clear to auscultation       Cardiovascular hypertension, Pt. on medications  Rhythm:Regular Rate:Normal     Neuro/Psych Hx polio    GI/Hepatic negative GI ROS, Neg liver ROS,   Endo/Other  negative endocrine ROS  Renal/GU CRFRenal disease     Musculoskeletal  (+) Arthritis ,   Abdominal   Peds  Hematology negative hematology ROS (+)   Anesthesia Other Findings   Reproductive/Obstetrics                            Lab Results  Component Value Date   WBC 10.2 12/29/2016   HGB 13.4 12/29/2016   HCT 38.6 (L) 12/29/2016   MCV 91.5 12/29/2016   PLT 197 12/29/2016   Lab Results  Component Value Date   CREATININE 1.18 12/29/2016   BUN 18 12/29/2016   NA 139 12/29/2016   K 3.9 12/29/2016   CL 104 12/29/2016   CO2 28 12/29/2016    Anesthesia Physical Anesthesia Plan  ASA: II  Anesthesia Plan: General   Post-op Pain Management:  Regional for Post-op pain   Induction: Intravenous  PONV Risk Score and Plan: 4 or greater and Ondansetron, Dexamethasone, Propofol, Midazolam and Scopolamine patch - Pre-op  Airway Management Planned: Oral ETT  Additional Equipment:   Intra-op Plan:   Post-operative Plan: Extubation in OR  Informed Consent: I have reviewed the patients History and Physical, chart, labs and discussed the procedure including the risks, benefits and alternatives for the proposed anesthesia with the patient or authorized representative who has indicated his/her understanding and acceptance.   Dental advisory given  Plan Discussed with: CRNA  Anesthesia Plan Comments:        Anesthesia  Quick Evaluation

## 2017-01-15 NOTE — Discharge Instructions (Signed)
HERNIA REPAIR: POST OP INSTRUCTIONS ° °###################################################################### ° °EAT °Gradually transition to a high fiber diet with a fiber supplement over the next few weeks after discharge.  Start with a pureed / full liquid diet (see below) ° °WALK °Walk an hour a day.  Control your pain to do that.   ° °CONTROL PAIN °Control pain so that you can walk, sleep, tolerate sneezing/coughing, go up/down stairs. ° °HAVE A BOWEL MOVEMENT DAILY °Keep your bowels regular to avoid problems.  OK to try a laxative to override constipation.  OK to use an antidairrheal to slow down diarrhea.  Call if not better after 2 tries ° °CALL IF YOU HAVE PROBLEMS/CONCERNS °Call if you are still struggling despite following these instructions. °Call if you have concerns not answered by these instructions ° °###################################################################### ° ° ° °1. DIET: Follow a light bland diet the first 24 hours after arrival home, such as soup, liquids, crackers, etc.  Be sure to include lots of fluids daily.  Avoid fast food or heavy meals as your are more likely to get nauseated.  Eat a low fat the next few days after surgery. °2. Take your usually prescribed home medications unless otherwise directed. °3. PAIN CONTROL: °a. Pain is best controlled by a usual combination of three different methods TOGETHER: °i. Ice/Heat °ii. Over the counter pain medication °iii. Prescription pain medication °b. Most patients will experience some swelling and bruising around the hernia(s) such as the bellybutton, groins, or old incisions.  Ice packs or heating pads (30-60 minutes up to 6 times a day) will help. Use ice for the first few days to help decrease swelling and bruising, then switch to heat to help relax tight/sore spots and speed recovery.  Some people prefer to use ice alone, heat alone, alternating between ice & heat.  Experiment to what works for you.  Swelling and bruising can take  several weeks to resolve.   °c. It is helpful to take an over-the-counter pain medication regularly for the first few weeks.  Choose one of the following that works best for you: °i. Naproxen (Aleve, etc)  Two 220mg tabs twice a day °ii. Ibuprofen (Advil, etc) Three 200mg tabs four times a day (every meal & bedtime) °iii. Acetaminophen (Tylenol, etc) 325-650mg four times a day (every meal & bedtime) °d. A  prescription for pain medication should be given to you upon discharge.  Take your pain medication as prescribed.  °i. If you are having problems/concerns with the prescription medicine (does not control pain, nausea, vomiting, rash, itching, etc), please call us [phone] to see if we need to switch you to a different pain medicine that will work better for you and/or control your side effect better. °ii. If you need a refill on your pain medication, please contact your pharmacy.  They will contact our office to request authorization. Prescriptions will not be filled after 5 pm or on week-ends. °4. Avoid getting constipated.  Between the surgery and the pain medications, it is common to experience some constipation.  Increasing fluid intake and taking a fiber supplement (such as Metamucil, Citrucel, FiberCon, MiraLax, etc) 1-2 times a day regularly will usually help prevent this problem from occurring.  A mild laxative (prune juice, Milk of Magnesia, MiraLax, etc) should be taken according to package directions if there are no bowel movements after 48 hours.   °5. Wash / shower every day.  You may shower over the dressings as they are waterproof.   °6. Remove   your waterproof bandages 5 days after surgery.  You may leave the incision open to air.  You may replace a dressing/Band-Aid to cover the incision for comfort if you wish.  Continue to shower over incision(s) after the dressing is off. ° ° ° °7. ACTIVITIES as tolerated:   °a. You may resume regular (light) daily activities beginning the next day--such  as daily self-care, walking, climbing stairs--gradually increasing activities as tolerated.  If you can walk 30 minutes without difficulty, it is safe to try more intense activity such as jogging, treadmill, bicycling, low-impact aerobics, swimming, etc. °b. Save the most intensive and strenuous activity for last such as sit-ups, heavy lifting, contact sports, etc  Refrain from any heavy lifting or straining until you are off narcotics for pain control.   °c. DO NOT PUSH THROUGH PAIN.  Let pain be your guide: If it hurts to do something, don't do it.  Pain is your body warning you to avoid that activity for another week until the pain goes down. °d. You may drive when you are no longer taking prescription pain medication, you can comfortably wear a seatbelt, and you can safely maneuver your car and apply brakes. °e. You may have sexual intercourse when it is comfortable.  °8. FOLLOW UP in our office °a. Please call [facility] at [phone] to set up an appointment to see your surgeon in the office for a follow-up appointment approximately 2-3 weeks after your surgery. °b. Make sure that you call for this appointment the day you arrive home to insure a convenient appointment time. °9.  IF YOU HAVE DISABILITY OR FAMILY LEAVE FORMS, BRING THEM TO THE OFFICE FOR PROCESSING.  DO NOT GIVE THEM TO YOUR DOCTOR. ° °WHEN TO CALL US [phone]: °1. Poor pain control °2. Reactions / problems with new medications (rash/itching, nausea, etc)  °3. Fever over 101.5 F (38.5 C) °4. Inability to urinate °5. Nausea and/or vomiting °6. Worsening swelling or bruising °7. Continued bleeding from incision. °8. Increased pain, redness, or drainage from the incision ° ° The clinic staff is available to answer your questions during regular business hours (8:30am-5pm).  Please don’t hesitate to call and ask to speak to one of our nurses for clinical concerns.  ° If you have a medical emergency, go to the nearest emergency room or call  911. ° A surgeon from [facility] is always on call at the hospitals in [place] ° °[facility] °[address], [address], [place], [place]  [address] ? ° [address] [address], , [place]   [address] °MAIN: [phone] ? TOLL FREE: [phone] ? FAX: [phone] °[email or web address] ° ° °

## 2017-01-15 NOTE — H&P (Signed)
Mason Scott  Location: [facility] Patient #: [id number] DOB: 1945-12-22 Married / Language: English / Race: White Male  Patient Care Team: Sharilyn Sites, MD as PCP - General (Family Medicine) Michael Boston, MD as Consulting Physician (General Surgery) Raynelle Bring, MD as Consulting Physician (Urology)   History of Present Illness   The patient is a 71 year old male who presents with an incisional hernia. Note for "Incisional hernia": ` ` ` Patient sent for surgical consultation at the request of Dr. Dutch Gray  Chief Complaint: Enlarging abdominal mass consistent with incisional hernia  The patient is a pleasant overweight active male. His had numerous abdominal surgeries. Had 2 partial robotic resections in 2016 by his urologist, Dr. Alinda Money. Partial nephrectomy on right. Partial nephrectomy on left. Patient noticed a lump at the incision around his belly button about 3 months after the second partial nephrectomy. It is gradually gotten larger the past year and a half. When he eats he noticed a gets larger and more full. He's never really had any pain with it. He works as a Geophysicist/field seismologist for a tour bus. Occasionally does moderate lifting and activity. Often help smooth entrance for the suitcases and boxes. Moves his bowels every day. Walks a half hour without difficulty. He does not smoke. He comes today with his wife. No history of skin problems or MRSA. Followed aggressively by his urologist, Dr. Raynelle Bring. No evidence of any renal recurrence. He does have some bladder tumors again regularly removed by cystoscopy. Eating in his abdomen, he was concerned about the reason. Because it is gone larger, his urologist recommended considering surgical consultation to see if he needs repair versus observation only.  (Review of systems as stated in this history (HPI) or in the review of systems. Otherwise all other 12 point ROS are negative)   Past Surgical  History Malachy Moan, RMA; 12/01/2016 11:06 AM) TURP   Diagnostic Studies History Malachy Moan, RMA; 12/01/2016 11:06 AM) Colonoscopy  5-10 years ago  Allergies Malachy Moan, RMA; 12/01/2016 11:07 AM) No Known Allergies 12/01/2016  Medication History Malachy Moan, RMA; 12/01/2016 11:07 AM) AmLODIPine Besylate (10MG  Tablet, Oral) Active. Meloxicam (15MG  Tablet, Oral) Active. Simvastatin (10MG  Tablet, Oral) Active. Valsartan-Hydrochlorothiazide (160-25MG  Tablet, Oral) Active. Medications Reconciled  Social History Malachy Moan, [place]; 12/01/2016 11:06 AM) Caffeine use  Coffee. No alcohol use  No drug use  Tobacco use  Never smoker.  Family History Malachy Moan, [place]; 12/01/2016 11:06 AM) Breast Cancer  Mother. Cerebrovascular Accident  Father. Depression  Mother. Heart Disease  Father, Mother. Hypertension  Father, Mother. Ovarian Cancer  Mother.  Other Problems Malachy Moan, RMA; 12/01/2016 11:06 AM) Arthritis  Bladder Problems  Cancer  High blood pressure  Prostate Cancer  Sleep Apnea     Review of Systems Malachy Moan RMA; 12/01/2016 11:06 AM) General Not Present- Appetite Loss, Chills, Fatigue, Fever, Night Sweats, Weight Gain and Weight Loss. Skin Not Present- Change in Wart/Mole, Dryness, Hives, Jaundice, New Lesions, Non-Healing Wounds, Rash and Ulcer. HEENT Not Present- Earache, Hearing Loss, Hoarseness, Nose Bleed, Oral Ulcers, Ringing in the Ears, Seasonal Allergies, Sinus Pain, Sore Throat, Visual Disturbances, Wears glasses/contact lenses and Yellow Eyes. Respiratory Present- Snoring. Not Present- Bloody sputum, Chronic Cough, Difficulty Breathing and Wheezing. Breast Not Present- Breast Mass, Breast Pain, Nipple Discharge and Skin Changes. Cardiovascular Not Present- Chest Pain, Difficulty Breathing Lying Down, Leg Cramps, Palpitations, Rapid Heart Rate, Shortness of Breath and Swelling of  Extremities. Gastrointestinal Not Present- Abdominal Pain, Bloating, Bloody Stool,  Change in Bowel Habits, Chronic diarrhea, Constipation, Difficulty Swallowing, Excessive gas, Gets full quickly at meals, Hemorrhoids, Indigestion, Nausea, Rectal Pain and Vomiting. Male Genitourinary Not Present- Blood in Urine, Change in Urinary Stream, Frequency, Impotence, Nocturia, Painful Urination, Urgency and Urine Leakage. Neurological Not Present- Decreased Memory, Fainting, Headaches, Numbness, Seizures, Tingling, Tremor, Trouble walking and Weakness. Psychiatric Not Present- Anxiety, Bipolar, Change in Sleep Pattern, Depression, Fearful and Frequent crying. Endocrine Not Present- Cold Intolerance, Excessive Hunger, Hair Changes, Heat Intolerance, Hot flashes and New Diabetes. Hematology Not Present- Blood Thinners, Easy Bruising, Excessive bleeding, Gland problems, HIV and Persistent Infections.  Vitals Malachy Moan RMA; 12/01/2016 11:07 AM) 12/01/2016 11:07 AM Weight: 219.4 lb Height: 70in Body Surface Area: 2.17 m Body Mass Index: 31.48 kg/m  Temp.: 98.90F  Pulse: 63 (Regular)  BP: 140/80 (Sitting, Left Arm, Standard)   BP 139/72   Pulse (!) 48   Temp 98 F (36.7 C) (Oral)   Resp 18   Ht 5\' 10"  (1.778 m)   Wt 98.4 kg (217 lb)   SpO2 100%   BMI 31.14 kg/m      Physical Exam Adin Hector MD; 12/01/2016 11:42 AM) General Mental Status-Alert. General Appearance-Not in acute distress, Not Sickly. Orientation-Oriented X3. Hydration-Well hydrated. Voice-Normal. Note: Pleasant. Chatty.   Integumentary Global Assessment Upon inspection and palpation of skin surfaces of the - Axillae: non-tender, no inflammation or ulceration, no drainage. and Distribution of scalp and body hair is normal. General Characteristics Temperature - normal warmth is noted.  Head and Neck Head-normocephalic, atraumatic with no lesions or palpable masses. Face Global  Assessment - atraumatic, no absence of expression. Neck Global Assessment - no abnormal movements, no bruit auscultated on the right, no bruit auscultated on the left, no decreased range of motion, non-tender. Trachea-midline. Thyroid Gland Characteristics - non-tender.  Eye Eyeball - Left-Extraocular movements intact, No Nystagmus. Eyeball - Right-Extraocular movements intact, No Nystagmus. Cornea - Left-No Hazy. Cornea - Right-No Hazy. Sclera/Conjunctiva - Left-No scleral icterus, No Discharge. Sclera/Conjunctiva - Right-No scleral icterus, No Discharge. Pupil - Left-Direct reaction to light normal. Pupil - Right-Direct reaction to light normal.  ENMT Ears Pinna - Left - no drainage observed, no generalized tenderness observed. Right - no drainage observed, no generalized tenderness observed. Nose and Sinuses External Inspection of the Nose - no destructive lesion observed. Inspection of the nares - Left - quiet respiration. Right - quiet respiration. Mouth and Throat Lips - Upper Lip - no fissures observed, no pallor noted. Lower Lip - no fissures observed, no pallor noted. Nasopharynx - no discharge present. Oral Cavity/Oropharynx - Tongue - no dryness observed. Oral Mucosa - no cyanosis observed. Hypopharynx - no evidence of airway distress observed.  Chest and Lung Exam Inspection Movements - Normal and Symmetrical. Accessory muscles - No use of accessory muscles in breathing. Palpation Palpation of the chest reveals - Non-tender. Auscultation Breath sounds - Normal and Clear.  Cardiovascular Auscultation Rhythm - Regular. Murmurs & Other Heart Sounds - Auscultation of the heart reveals - No Murmurs and No Systolic Clicks.  Abdomen Inspection Inspection of the abdomen reveals - No Visible peristalsis and No Abnormal pulsations. Umbilicus - No Bleeding, No Urine drainage. Palpation/Percussion Palpation and Percussion of the abdomen reveal - Soft, Non  Tender, No Rebound tenderness, No Rigidity (guarding) and No Cutaneous hyperesthesia. Note: 12 x 12 cm mass reduces down to a 4 x 3 cm supraumbilical fascial defect.  Abdomen soft. Nontender, nondistended. No guarding. No diastasis. No umbilical nor other hernias  Male Genitourinary Sexual Maturity Tanner 5 - Adult hair pattern and Adult penile size and shape.  Peripheral Vascular Upper Extremity Inspection - Left - No Cyanotic nailbeds, Not Ischemic. Right - No Cyanotic nailbeds, Not Ischemic.  Neurologic Neurologic evaluation reveals -normal attention span and ability to concentrate, able to name objects and repeat phrases. Appropriate fund of knowledge , normal sensation and normal coordination. Mental Status Affect - not angry, not paranoid. Cranial Nerves-Normal Bilaterally. Gait-Normal.  Neuropsychiatric Mental status exam performed with findings of-able to articulate well with normal speech/language, rate, volume and coherence, thought content normal with ability to perform basic computations and apply abstract reasoning and no evidence of hallucinations, delusions, obsessions or homicidal/suicidal ideation.  Musculoskeletal Global Assessment Spine, Ribs and Pelvis - no instability, subluxation or laxity. Right Upper Extremity - no instability, subluxation or laxity.  Lymphatic Head & Neck  General Head & Neck Lymphatics: Bilateral - Description - No Localized lymphadenopathy. Axillary  General Axillary Region: Bilateral - Description - No Localized lymphadenopathy. Femoral & Inguinal  Generalized Femoral & Inguinal Lymphatics: Left - Description - No Localized lymphadenopathy. Right - Description - No Localized lymphadenopathy.    Assessment & Plan  INCISIONAL HERNIA, WITHOUT OBSTRUCTION OR GANGRENE (K43.2) Impression: Incisional hernia from his prior partial nephrectomies. Increasing hernia sac.  At some point, I think he would benefit from surgical  repair. While he's had numerous renal and bladder cancers removed he has no evidence of any distant metastatic disease. He is on an aggressive but appropriate survivable pathway with his urologist. Lindie Spruce are that he has many more years of life in him for this to continue to get larger and the potential problem. He like small bowel goes into it when he stands and coughs. No hernias elsewhere.  He's interested in proceeding with surgery. He is hoping to do it after one of his driving trips, most likely wanting to do it in mid/late July. That gives him enough time to recover for [employer] football season.  PREOP - [place] - ENCOUNTER FOR PREOPERATIVE EXAMINATION FOR GENERAL SURGICAL PROCEDURE (Z01.818) Current Plans You are being scheduled for surgery- Our schedulers will call you.  You should hear from our office's scheduling department within 5 working days about the location, date, and time of surgery. We try to make accommodations for patient's preferences in scheduling surgery, but sometimes the OR schedule or the surgeon's schedule prevents [place] from making those accommodations.  If you have not heard from our office [phone]) in 5 working days, call the office and ask for your surgeon's nurse.  If you have other questions about your diagnosis, plan, or surgery, call the office and ask for your surgeon's nurse.  Written instructions provided The anatomy & physiology of the abdominal wall was discussed. The pathophysiology of hernias was discussed. Natural history risks without surgery including progeressive enlargement, pain, incarceration, & strangulation was discussed. Contributors to complications such as smoking, obesity, diabetes, prior surgery, etc were discussed.  I feel the risks of no intervention will lead to serious problems that outweigh the operative risks; therefore, I recommended surgery to reduce and repair the hernia. I explained laparoscopic techniques with possible  need for an open approach. I noted the probable use of mesh to patch and/or buttress the hernia repair  Risks such as bleeding, infection, abscess, need for further treatment, heart attack, death, and other risks were discussed. I noted a good likelihood this will help address the problem. Goals of post-operative recovery were discussed as well. Possibility  that this will not correct all symptoms was explained. I stressed the importance of low-impact activity, aggressive pain control, avoiding constipation, & not pushing through pain to minimize risk of post-operative chronic pain or injury. Possibility of reherniation especially with smoking, obesity, diabetes, immunosuppression, and other health conditions was discussed. We will work to minimize complications.  An educational handout further explaining the pathology & treatment options was given as well. Questions were answered. The patient expresses understanding & wishes to proceed with surgery.  Pt Education - [facility] Hernia Post-Op HCI (Daiki Dicostanzo): discussed with patient and provided information. Pt Education - [facility] Pain Control (Ajay Strubel) Pt Education - Pamphlet Given - Laparoscopic Hernia Repair: discussed with patient and provided information.  Adin Hector, M.D., F.A.C.S. Gastrointestinal and Minimally Invasive Surgery [facility] [address] [place], [place] [address] [phone] Main / Paging  I have re-reviewed the the patient's records, history, medications, and allergies.  I have re-examined the patient.  I again discussed intraoperative plans and goals of post-operative recovery.  The patient agrees to proceed.  Mason Scott  Apr 21, 1946 [id number]  Patient Care Team: Sharilyn Sites, MD as PCP - General (Family Medicine) Michael Boston, MD as Consulting Physician (General Surgery) Raynelle Bring, MD as Consulting Physician (Urology)  Patient Active Problem List   Diagnosis Date  Noted  . Renal neoplasm 02/21/2015  . Prostate cancer ([place]) 07/21/2012    Past Medical History:  Diagnosis Date  . Arthritis    knuckles  . Bladder cancer ([place])   . Chronic kidney disease    kidney mass right  . Erectile dysfunction    Mild  . History of bladder cancer 03/1990  . Hyperlipidemia   . Hypertension   . OSA on CPAP    cpap- setting at 9   . 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 Prostate cancer ([place]) 05/23/12   history prior seed implant.     Past Surgical History:  Procedure Laterality Date  . BASAL CELL CARCINOMA EXCISION  02/27/14   Removed and Biopsed from back x 2, rt ear, Lt hand   . CYSTOSCOPY N/A 09/29/2012   Procedure: CYSTOSCOPY FLEXIBLE;  Surgeon: Dutch Gray, MD;  Location: [facility];  Service: Urology;  Laterality: N/A;  no seeds found in bladder  . CYSTOSCOPY N/A 02/21/2015   Procedure: CYSTOSCOPY FLEXIBLE;  Surgeon: Raynelle Bring, MD;  Location: [facility];  Service: Urology;  Laterality: N/A;  . CYSTOSCOPY N/A 05/27/2015   Procedure: CYSTOSCOPY FLEXIBLE;  Surgeon: Raynelle Bring, MD;  Location: [facility];  Service: Urology;  Laterality: N/A;  . CYSTOSCOPY W/ RETROGRADES N/A 05/25/2016   Procedure: CYSTOSCOPY WITH RETROGRADE PYELOGRAM, BLADDER BIOPSIES;  Surgeon: Raynelle Bring, MD;  Location: [facility];  Service: Urology;  Laterality: N/A;  . CYSTOSCOPY WITH BIOPSY N/A 10/23/2013   Procedure: CYSTOSCOPY WITH BIOPSY, bilateral retrograde, transurethral resection of  bladder tumor ;  Surgeon: Dutch Gray, MD;  Location: [facility];  Service: Urology;  Laterality: N/A;  . CYSTOSCOPY WITH BIOPSY N/A 03/08/2014   Procedure: CYSTOSCOPY WITH BIOPSY;  Surgeon: Raynelle Bring, MD;  Location: [facility];  Service: Urology;  Laterality: N/A;  . CYSTOSCOPY WITH BIOPSY N/A 11/12/2014   Procedure: CYSTOSCOPY WITH BLADDER BIOPSIES;  Surgeon: Raynelle Bring, MD;  Location: [facility];  Service: Urology;  Laterality: N/A;  .  CYSTOSCOPY WITH BIOPSY Bilateral 01/16/2016   Procedure: CYSTOSCOPY WITH BLADDER BIOPSY,  BILATERAL RETROGRADE PYELOGRAM;  Surgeon: Raynelle Bring, MD;  Location: [facility];  Service: Urology;  Laterality: Bilateral;  . GANGLION CYST EXCISION Right 06-08-2001   AND 10-19-2001  RE-EXCISION RECURRENT CYST   . PILONIDAL CYST EXCISION  2004  . PROSTATE BIOPSY  05/23/12  . RADIOACTIVE SEED IMPLANT N/A 09/29/2012   Procedure: RADIOACTIVE SEED IMPLANT;  Surgeon: Dutch Gray, MD;  Location[facility];  Service: Urology;  Laterality: N/A;  66    seeds implanted  . ROBOTIC ASSITED PARTIAL NEPHRECTOMY Right 02/21/2015   Procedure: ROBOTIC ASSITED PARTIAL NEPHRECTOMY;  Surgeon: Raynelle Bring, MD;  Location: [facility];  Service: Urology;  Laterality: Right;  . ROBOTIC ASSITED PARTIAL NEPHRECTOMY Left 05/27/2015   Procedure: ROBOTIC ASSISTED PARTIAL NEPHRECTOMY;  Surgeon: Raynelle Bring, MD;  Location: [facility];  Service: Urology;  Laterality: Left;  . TRANSURETHRAL RESECTION OF BLADDER TUMOR  03/1990    Social History   Social History  . Marital status: Married    Spouse name: N/A  . Number of children: 2  . Years of education: N/A   Occupational History  .  Retired   Social History Main Topics  . Smoking status: Never Smoker  . Smokeless tobacco: Never Used  . Alcohol use No  . Drug use: No  . Sexual activity: Not on file   Other Topics Concern  . Not on file   Social History Narrative   Married 82 years   2 sons    Family History  Problem Relation Age of Onset  . Stroke Father   . Congenital heart disease Mother        Killed MVA    Current Facility-Administered Medications  Medication Dose Route Frequency Provider Last Rate Last Dose  . bupivacaine liposome (EXPAREL) 1.3 % injection 266 mg  20 mL Infiltration On Call to OR Michael Boston, MD      . ceFAZolin (ANCEF) 2-4 GM/100ML-% IVPB           . ceFAZolin (ANCEF) IVPB 2g/100 mL premix  2 g Intravenous On Call to OR  Michael Boston, MD      . Chlorhexidine Gluconate Cloth 2 % PADS 6 each  6 each Topical Once Michael Boston, MD       And  . Chlorhexidine Gluconate Cloth 2 % PADS 6 each  6 each Topical Once Michael Boston, MD       Facility-Administered Medications Ordered in Other Encounters  Medication Dose Route Frequency Provider Last Rate Last Dose  . lactated ringers infusion    Continuous PRN Lind Covert, CRNA         No Known Allergies  BP 139/72   Pulse (!) 48   Temp 98 F (36.7 C) (Oral)   Resp 18   Ht 5\' 10"  (1.778 m)   Wt 98.4 kg (217 lb)   SpO2 100%   BMI 31.14 kg/m   Labs: No results found for this or any previous visit (from the past 48 hour(s)).  Imaging / Studies: No results found.   Adin Hector, M.D., F.A.C.S. Gastrointestinal and Minimally Invasive Surgery [facility] [address] [place], [place] [address] [phone] Main / Paging  01/15/2017 7:12 AM

## 2017-01-15 NOTE — Progress Notes (Signed)
AssistedDr. Rob Fitzgerald with right, left, ultrasound guided, transabdominal plane block. Side rails up, monitors on throughout procedure. See vital signs in flow sheet. Tolerated Procedure well.  

## 2017-01-15 NOTE — Anesthesia Postprocedure Evaluation (Signed)
Anesthesia Post Note  Patient: Mason Scott  Procedure(s) Performed: Procedure(s) (LRB): LAPAROSCOPIC VENTRAL HERNIA REPAIR WITH MESH (N/A) INSERTION OF MESH (N/A)     Patient location during evaluation: PACU Anesthesia Type: General Level of consciousness: awake and alert Pain management: pain level controlled Vital Signs Assessment: post-procedure vital signs reviewed and stable Respiratory status: spontaneous breathing, nonlabored ventilation, respiratory function stable and patient connected to nasal cannula oxygen Cardiovascular status: blood pressure returned to baseline and stable Postop Assessment: no signs of nausea or vomiting Anesthetic complications: no    Last Vitals:  Vitals:   01/15/17 1115 01/15/17 1130  BP: (!) 142/81 137/83  Pulse: 64 62  Resp: (!) 21 20  Temp:      Last Pain:  Vitals:   01/15/17 1100  TempSrc:   PainSc: 0-No pain                 Tiajuana Amass

## 2017-01-15 NOTE — Anesthesia Procedure Notes (Signed)
Procedure Name: Intubation Date/Time: 01/15/2017 7:42 AM Performed by: Lind Covert Pre-anesthesia Checklist: Patient identified, Emergency Drugs available, Suction available, Patient being monitored and Timeout performed Patient Re-evaluated:Patient Re-evaluated prior to induction Oxygen Delivery Method: Circle system utilized Preoxygenation: Pre-oxygenation with 100% oxygen Induction Type: IV induction Laryngoscope Size: Mac and 4 Grade View: Grade I Tube type: Oral Tube size: 7.5 mm Number of attempts: 1 Airway Equipment and Method: Stylet Placement Confirmation: ETT inserted through vocal cords under direct vision,  positive ETCO2 and breath sounds checked- equal and bilateral Secured at: 22 cm Tube secured with: Tape Dental Injury: Teeth and Oropharynx as per pre-operative assessment

## 2017-01-16 ENCOUNTER — Encounter (HOSPITAL_COMMUNITY): Payer: Self-pay | Admitting: Surgery

## 2017-01-16 DIAGNOSIS — K432 Incisional hernia without obstruction or gangrene: Secondary | ICD-10-CM | POA: Diagnosis not present

## 2017-01-16 DIAGNOSIS — E785 Hyperlipidemia, unspecified: Secondary | ICD-10-CM | POA: Diagnosis present

## 2017-01-16 DIAGNOSIS — I10 Essential (primary) hypertension: Secondary | ICD-10-CM | POA: Diagnosis present

## 2017-01-16 DIAGNOSIS — Z9989 Dependence on other enabling machines and devices: Secondary | ICD-10-CM

## 2017-01-16 DIAGNOSIS — N189 Chronic kidney disease, unspecified: Secondary | ICD-10-CM | POA: Diagnosis present

## 2017-01-16 DIAGNOSIS — G4733 Obstructive sleep apnea (adult) (pediatric): Secondary | ICD-10-CM

## 2017-01-16 NOTE — Progress Notes (Signed)
Discharge instructions discussed with patient and spouse, verbalized understanding and agreement, prescriptions given to patient

## 2017-01-16 NOTE — Care Management Note (Signed)
[facility] NOTIFICATION   Patient Details  Name: Mason Scott MRN: [id number] Date of Birth: [birth year]   Medicare Observation Status Notification Given:   yes    Norina Buzzard, RN 01/16/2017, 9:01 AM

## 2017-01-16 NOTE — Discharge Summary (Signed)
Physician Discharge Summary  Patient ID: Mason Scott MRN: [id number] DOB/AGE: 1945/10/11  71 y.o.  Admit date: 01/15/2017 Discharge date:   Patient Care Team: Sharilyn Sites, MD as PCP - General (Family Medicine) Michael Boston, MD as Consulting Physician (General Surgery) Raynelle Bring, MD as Consulting Physician (Urology)  Discharge Diagnoses:  Principal Problem:   Incisional hernia s/p lap repair with mesh 01/15/2017 Active Problems:   OSA (obstructive sleep apnea)   Hypertension   Hyperlipidemia   Chronic kidney disease   POST-OPERATIVE DIAGNOSIS:   Incisional abdominal wall hernia  SURGERY:  01/15/2017  Procedure(s): LAPAROSCOPIC VENTRAL HERNIA REPAIR WITH MESH INSERTION OF MESH  SURGEON:    Surgeon(s): Michael Boston, MD  Consults: None  Hospital Course:   The patient underwent the surgery above.  Postoperatively, the patient gradually mobilized and advanced to a solid diet.  Pain and other symptoms were treated aggressively.    By the time of discharge, the patient was walking well the hallways, eating food, having flatus.  Pain was well-controlled on an oral medications.  Based on meeting discharge criteria and continuing to recover, I felt it was safe for the patient to be discharged from the hospital to further recover with close followup. Postoperative recommendations were discussed in detail.  They are written as well.  They are happy with our care & were very kind & appreciative.  I am hope he continues to do well.  Will see him in the office later this month  Discharged Condition: good  Disposition:  Follow-up Information    Michael Boston, MD. Schedule an appointment as soon as possible for a visit in 3 weeks.   Specialty:  General Surgery Why:  To follow up after your operation Contact information: [address] [address] [phone]           01-Home or Self Care  Discharge Instructions    Call MD for:     Complete by:  As directed    FEVER >101.5 F (Temperatures <101.15F occasionally happen and are not significant)   Call MD for:  extreme fatigue    Complete by:  As directed    Call MD for:  persistant dizziness or light-headedness    Complete by:  As directed    Call MD for:  persistant nausea and vomiting    Complete by:  As directed    Call MD for:  redness, tenderness, or signs of infection (pain, swelling, redness, odor or green/yellow discharge around incision site)    Complete by:  As directed    Call MD for:  severe uncontrolled pain    Complete by:  As directed    Diet - low sodium heart healthy    Complete by:  As directed    Follow a light diet the first few days at home.  Start with a bland diet such as soups, liquids, starchy foods, low fat foods, etc.   If you feel full, bloated, or constipated, stay on a full liquid or pureed/blenderized diet for a few days until you feel better and no longer constipated. Gradually get back to a regular solid diet.  Avoid fast food or heavy meals the first week as you are more likely to get nauseated.   Discharge instructions    Complete by:  As directed    One the day of your discharge from the hospital (or the next business weekday), please call [facility] to set up or confirm an appointment  to see your surgeon in the office for a follow-up appointment.  Usually it is 2-3 weeks after your surgery.  Other concerns If you are not getting better after two weeks or are noticing you are getting worse, contact our office [phone] for further advice.  We may need to adjust your medications, re-evaluate you in the office, send you to the emergency room, or see what other things we can do to help. The clinic staff is available to answer your questions during regular business hours (8:30am-5pm).  Please don't hesitate to call and ask to speak to one of our nurses for clinical concerns.    A surgeon from [facility] is  always on call at the hospitals 24 hours/day If you have a medical emergency, go to the nearest emergency room or call 911.   Driving Restrictions    Complete by:  As directed    You may drive when you are no longer taking prescription pain medication, you can comfortably wear a seatbelt, and you can safely maneuver your car and apply brakes.   Increase activity slowly    Complete by:  As directed    Lifting restrictions    Complete by:  As directed    You may resume regular (light) daily activities beginning the next day-such as daily self-care, walking, climbing stairs-gradually increasing activities as tolerated.   If you can walk 30 minutes without difficulty, it is safe to try more intense activity such as jogging, treadmill, bicycling, low-impact aerobics, swimming, etc. Save the most intensive and strenuous activity for last such as sit-ups, heavy lifting, contact sports, etc   Refrain from any heavy lifting or straining until you are off narcotics for pain control.   DO NOT PUSH THROUGH PAIN.   Let pain be your guide: If it hurts to do something, don't do it.   Pain is your body warning you to avoid that activity for another week until the pain goes down.   May shower / Bathe    Complete by:  As directed    Wash / shower every day.  You may shower over the dressings as they are waterproof.  Continue to shower over incision(s) after the dressing is off.   May walk up steps    Complete by:  As directed    Remove dressing in 72 hours    Complete by:  As directed    Remove your waterproof dressings, skin tapes, and other bandages 3-5 days after surgery.   You may leave the incision(s) open to air.   You may replace a dressing/Band-Aid to cover the incision for comfort if you wish.   Sexual Activity Restrictions    Complete by:  As directed    You may have sexual intercourse when it is comfortable. If it hurts to do something, stop.      Allergies as of 01/16/2017   No Known  Allergies     Medication List    STOP taking these medications   HYDROcodone-acetaminophen 5-325 MG tablet Commonly known as:  NORCO/VICODIN Replaced by:  HYDROcodone-acetaminophen 10-325 MG tablet     TAKE these medications   amLODipine 10 MG tablet Commonly known as:  NORVASC Take 10 mg by mouth every evening.   aspirin EC 81 MG tablet Take 81 mg by mouth daily.   FISH OIL OMEGA-3 1000 MG Caps Take 1 capsule by mouth every evening.   HYDROcodone-acetaminophen 10-325 MG tablet Commonly known as:  NORCO Take 1-2 tablets  by mouth every 6 (six) hours as needed for moderate pain or severe pain. Replaces:  HYDROcodone-acetaminophen 5-325 MG tablet   meloxicam 15 MG tablet Commonly known as:  MOBIC Take 7.5 mg by mouth every evening.   methocarbamol 750 MG tablet Commonly known as:  ROBAXIN Take 1 tablet (750 mg total) by mouth 4 (four) times daily as needed (use for muscle cramps/pain).   phenazopyridine 100 MG tablet Commonly known as:  PYRIDIUM Take 1 tablet (100 mg total) by mouth 3 (three) times daily as needed for pain (for burning).   simvastatin 10 MG tablet Commonly known as:  ZOCOR Take 5 mg by mouth every evening.   valsartan-hydrochlorothiazide 160-25 MG tablet Commonly known as:  DIOVAN-HCT Take 0.5 tablets by mouth every morning.       Significant Diagnostic Studies:  No results found for this or any previous visit (from the past 72 hour(s)).  No results found.  Discharge Exam: Blood pressure 114/75, pulse (!) 52, temperature 98.1 F (36.7 C), temperature source Oral, resp. rate 18, height 5\' 10"  (1.778 m), weight 98.4 kg (217 lb), SpO2 98 %.  General: Pt awake/alert/oriented x4 in No acute distress Eyes: PERRL, normal EOM.  Sclera clear.  No icterus Neuro: CN II-XII intact w/o focal sensory/motor deficits. Lymph: No head/neck/groin lymphadenopathy Psych:  No delerium/psychosis/paranoia.  Smiling.  Inquisitive HENT: Normocephalic, Mucus  membranes moist.  No thrush Neck: Supple, No tracheal deviation Chest: No chest wall pain w good excursion CV:  Pulses intact.  Regular rhythm MS: Normal AROM mjr joints.  No obvious deformity Abdomen: Soft.  Nondistended.  Mildly tender at incisions only.  No evidence of peritonitis.  No incarcerated hernias. Ext:  SCDs BLE.  No mjr edema.  No cyanosis Skin: No petechiae / purpura  Past Medical History:  Diagnosis Date  . Arthritis    knuckles  . Bladder cancer ([place])   . Chronic kidney disease    kidney mass right  . Erectile dysfunction    Mild  . History of bladder cancer 03/1990  . Hyperlipidemia   . Hypertension   . OSA on CPAP    cpap- setting at 9   . Polio    as child-age 67 -right foot turned in a little bit"right leg smaller than left"  . Prostate cancer ([place]) 05/23/12   history prior seed implant.     Past Surgical History:  Procedure Laterality Date  . BASAL CELL CARCINOMA EXCISION  02/27/14   Removed and Biopsed from back x 2, rt ear, Lt hand   . CYSTOSCOPY N/A 09/29/2012   Procedure: CYSTOSCOPY FLEXIBLE;  Surgeon: Dutch Gray, MD;  Location: [facility];  Service: Urology;  Laterality: N/A;  no seeds found in bladder  . CYSTOSCOPY N/A 02/21/2015   Procedure: CYSTOSCOPY FLEXIBLE;  Surgeon: Raynelle Bring, MD;  Location: [facility];  Service: Urology;  Laterality: N/A;  . CYSTOSCOPY N/A 05/27/2015   Procedure: CYSTOSCOPY FLEXIBLE;  Surgeon: Raynelle Bring, MD;  Location: [facility];  Service: Urology;  Laterality: N/A;  . CYSTOSCOPY W/ RETROGRADES N/A 05/25/2016   Procedure: CYSTOSCOPY WITH RETROGRADE PYELOGRAM, BLADDER BIOPSIES;  Surgeon: Raynelle Bring, MD;  Location: [facility];  Service: Urology;  Laterality: N/A;  . CYSTOSCOPY WITH BIOPSY N/A 10/23/2013   Procedure: CYSTOSCOPY WITH BIOPSY, bilateral retrograde, transurethral resection of  bladder tumor ;  Surgeon: Dutch Gray, MD;  Location: [facility];  Service: Urology;  Laterality: N/A;  . CYSTOSCOPY WITH BIOPSY N/A  03/08/2014   Procedure: CYSTOSCOPY  WITH BIOPSY;  Surgeon: Raynelle Bring, MD;  Location: [facility];  Service: Urology;  Laterality: N/A;  . CYSTOSCOPY WITH BIOPSY N/A 11/12/2014   Procedure: CYSTOSCOPY WITH BLADDER BIOPSIES;  Surgeon: Raynelle Bring, MD;  Location: [facility];  Service: Urology;  Laterality: N/A;  . CYSTOSCOPY WITH BIOPSY Bilateral 01/16/2016   Procedure: CYSTOSCOPY WITH BLADDER BIOPSY,  BILATERAL RETROGRADE PYELOGRAM;  Surgeon: Raynelle Bring, MD;  Location: [facility];  Service: Urology;  Laterality: Bilateral;  . GANGLION CYST EXCISION Right 06-08-2001   AND 10-19-2001  RE-EXCISION RECURRENT CYST   . INSERTION OF MESH N/A 01/15/2017   Procedure: INSERTION OF MESH;  Surgeon: Michael Boston, MD;  Location: [facility];  Service: General;  Laterality: N/A;  . PILONIDAL CYST EXCISION  2004  . PROSTATE BIOPSY  05/23/12  . RADIOACTIVE SEED IMPLANT N/A 09/29/2012   Procedure: RADIOACTIVE SEED IMPLANT;  Surgeon: Dutch Gray, MD;  Location[facility];  Service: Urology;  Laterality: N/A;  66    seeds implanted  . ROBOTIC ASSITED PARTIAL NEPHRECTOMY Right 02/21/2015   Procedure: ROBOTIC ASSITED PARTIAL NEPHRECTOMY;  Surgeon: Raynelle Bring, MD;  Location: [facility];  Service: Urology;  Laterality: Right;  . ROBOTIC ASSITED PARTIAL NEPHRECTOMY Left 05/27/2015   Procedure: ROBOTIC ASSISTED PARTIAL NEPHRECTOMY;  Surgeon: Raynelle Bring, MD;  Location: [facility];  Service: Urology;  Laterality: Left;  . TRANSURETHRAL RESECTION OF BLADDER TUMOR  03/1990  . VENTRAL HERNIA REPAIR N/A 01/15/2017   Procedure: LAPAROSCOPIC VENTRAL HERNIA REPAIR WITH MESH;  Surgeon: Michael Boston, MD;  Location: [facility];  Service: General;  Laterality: N/A;  ERAS PATHWAY BILATERAL TAP BLOCK    Social History   Social History  . Marital status: Married    Spouse name: N/A  . Number of children: 2  . Years of education: N/A   Occupational History  .  Retired   Social History Main Topics  . Smoking status: Never Smoker  .  Smokeless tobacco: Never Used  . Alcohol use No  . Drug use: No  . Sexual activity: Not on file   Other Topics Concern  . Not on file   Social History Narrative   Married 70 years   2 sons    Family History  Problem Relation Age of Onset  . Stroke Father   . Congenital heart disease Mother        Killed MVA    Current Facility-Administered Medications  Medication Dose Route Frequency Provider Last Rate Last Dose  . acetaminophen (TYLENOL) tablet 1,000 mg  1,000 mg Oral Tor Netters, MD   1,000 mg at 01/16/17 0550  . alum & mag hydroxide-simeth (MAALOX/MYLANTA) 200-200-20 MG/5ML suspension 30 mL  30 mL Oral Q6H PRN Michael Boston, MD      . amLODipine (NORVASC) tablet 10 mg  10 mg Oral QPM Michael Boston, MD   10 mg at 01/15/17 1700  . aspirin EC tablet 81 mg  81 mg Oral Daily Michael Boston, MD   81 mg at 01/15/17 1400  . bisacodyl (DULCOLAX) suppository 10 mg  10 mg Rectal Daily PRN Michael Boston, MD      . diphenhydrAMINE (BENADRYL) 12.5 MG/5ML elixir 12.5 mg  12.5 mg Oral Q6H PRN Michael Boston, MD       Or  . diphenhydrAMINE (BENADRYL) injection 12.5 mg  12.5 mg Intravenous Q6H PRN Michael Boston, MD      . enoxaparin (LOVENOX) injection 40 mg  40 mg Subcutaneous Q24H Michael Boston, MD      .  gabapentin (NEURONTIN) capsule 300 mg  300 mg Oral Ardeen Fillers, MD   300 mg at 01/15/17 2116  . guaiFENesin-dextromethorphan (ROBITUSSIN DM) 100-10 MG/5ML syrup 10 mL  10 mL Oral Q4H PRN Michael Boston, MD      . hydrALAZINE (APRESOLINE) injection 5-20 mg  5-20 mg Intravenous Q4H PRN Michael Boston, MD      . irbesartan (AVAPRO) tablet 75 mg  75 mg Oral Daily Michael Boston, MD   75 mg at 01/15/17 1400   Or  . hydrochlorothiazide (MICROZIDE) capsule 12.5 mg  12.5 mg Oral Daily Michael Boston, MD   12.5 mg at 01/15/17 1402  . hydrocortisone (ANUSOL-HC) 2.5 % rectal cream 1 application  1 application Topical QID PRN Michael Boston, MD      . hydrocortisone cream 1 % 1 application  1  application Topical TID PRN Michael Boston, MD      . HYDROmorphone (DILAUDID) injection 0.5-2 mg  0.5-2 mg Intravenous Q2H PRN Michael Boston, MD   1 mg at 01/16/17 0234  . lactated ringers infusion 1,000 mL  1,000 mL Intravenous Q8H PRN Michael Boston, MD      . lip balm (CARMEX) ointment 1 application  1 application Topical BID Michael Boston, MD   1 application at 73/71/06 2120  . LORazepam (ATIVAN) injection 0.5-1 mg  0.5-1 mg Intravenous Q8H PRN Michael Boston, MD      . magic mouthwash  15 mL Oral QID PRN Michael Boston, MD      . meloxicam [facility]) tablet 7.5 mg  7.5 mg Oral QPM Michael Boston, MD   7.5 mg at 01/15/17 1741  . menthol-cetylpyridinium (CEPACOL) lozenge 3 mg  1 lozenge Oral PRN Michael Boston, MD      . methocarbamol (ROBAXIN) 1,000 mg in dextrose 5 % 50 mL IVPB  1,000 mg Intravenous Q6H PRN Michael Boston, MD 120 mL/hr at 01/15/17 2121 1,000 mg at 01/15/17 2121  . methocarbamol (ROBAXIN) tablet 1,000 mg  1,000 mg Oral Q6H PRN Michael Boston, MD      . omega-3 acid ethyl esters (LOVAZA) capsule 1 g  1 g Oral [id number] Michael Boston, MD   1 g at 01/15/17 1701  . ondansetron (ZOFRAN-ODT) disintegrating tablet 4 mg  4 mg Oral Q6H PRN Michael Boston, MD       Or  . ondansetron (ZOFRAN) injection 4 mg  4 mg Intravenous Q6H PRN Michael Boston, MD      . phenazopyridine (PYRIDIUM) tablet 100 mg  100 mg Oral TID PRN Michael Boston, MD      . phenol (CHLORASEPTIC) mouth spray 1-2 spray  1-2 spray Mouth/Throat PRN Michael Boston, MD      . polyethylene glycol (MIRALAX / GLYCOLAX) packet 17 g  17 g Oral Daily PRN Michael Boston, MD      . simethicone (MYLICON) chewable tablet 40 mg  40 mg Oral Q6H PRN Michael Boston, MD      . simvastatin (ZOCOR) tablet 5 mg  5 mg Oral QPM Michael Boston, MD   5 mg at 01/15/17 1700     No Known Allergies  Signed: Morton Peters, M.D., F.A.C.S. Gastrointestinal and Minimally Invasive Surgery [facility] [address], [place], [place] [address] [phone] Main / Paging   01/16/2017, 7:43 AM

## 2017-01-18 NOTE — Addendum Note (Signed)
Addendum  created 01/18/17 0659 by Lollie Sails, CRNA   Charge Capture section accepted

## 2017-02-26 IMAGING — CR DG CHEST 2V
2 series · 2 of 2 positions shown · non-contrast
Comparison: Chest x-ray of October 16, 2013

CLINICAL DATA: Preoperative exam prior to TUR of a bladder tumor,
history of hypertension and prostate and bladder malignancy.

EXAM:
CHEST  2 VIEW

[w chest pa *]
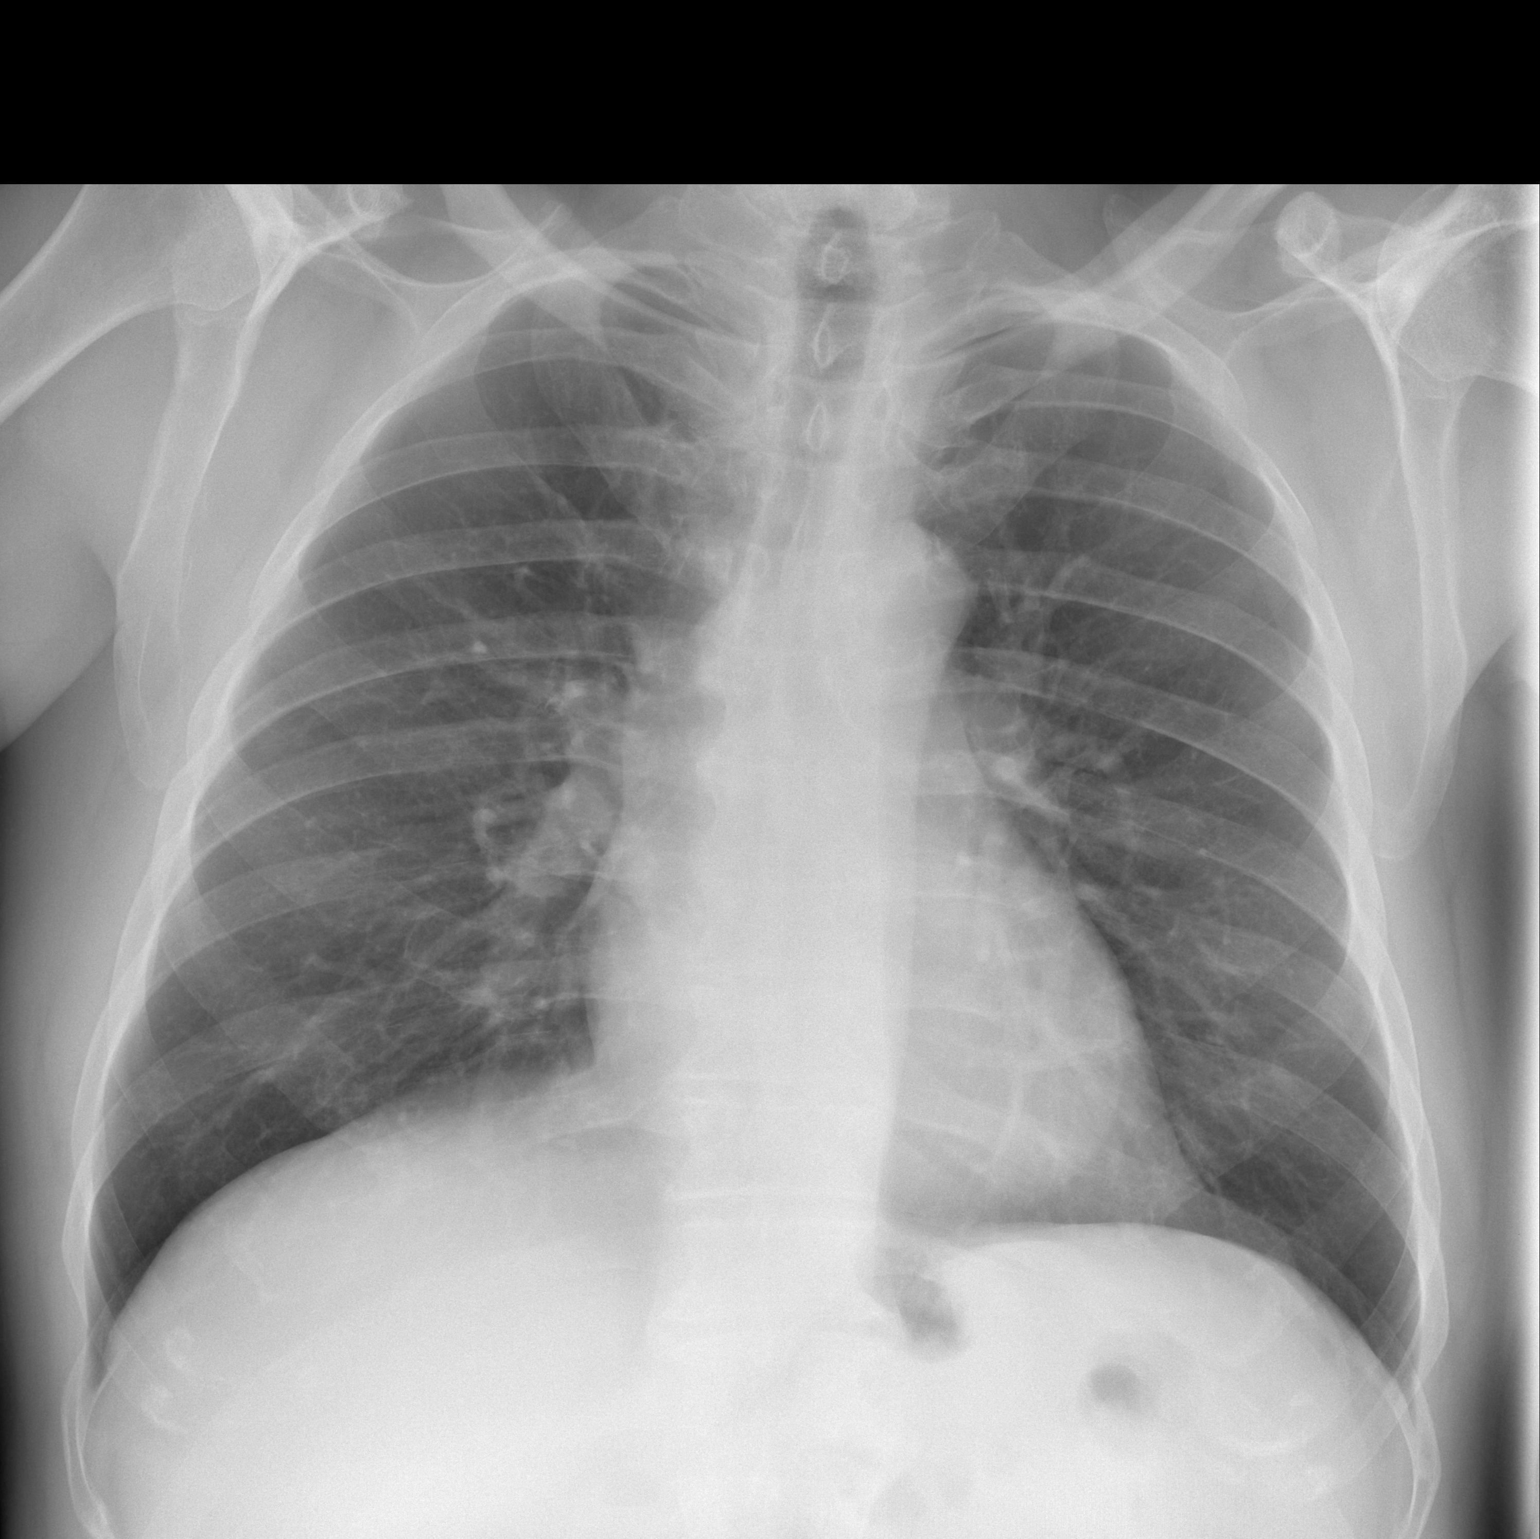

[w chest lat]
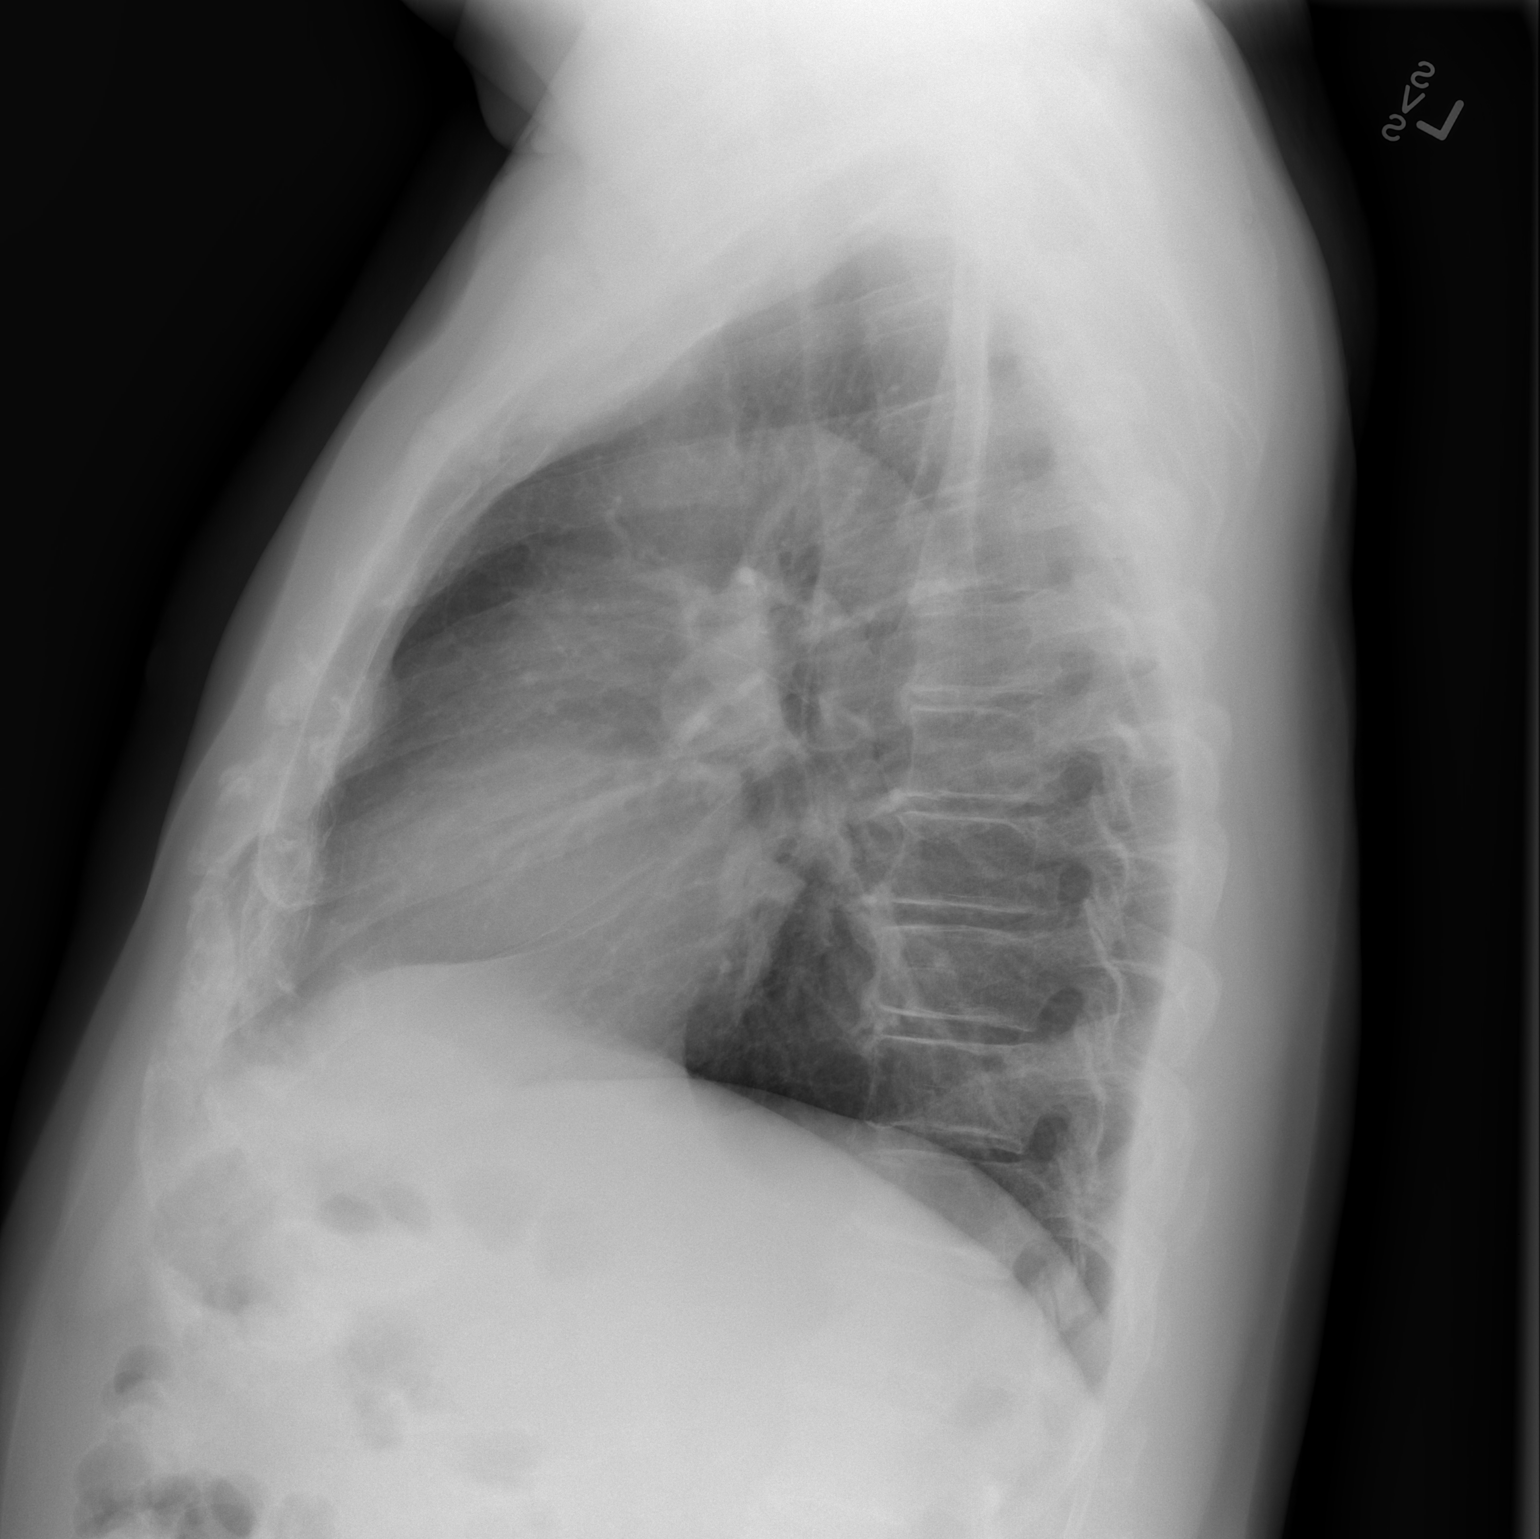

[2 of 2 positions shown; findings below may reference images not displayed]

FINDINGS: The lungs are adequately inflated. There is no focal infiltrate. A 5
mm subpleural right lower lobe pulmonary nodule previously
demonstrated on chest CT is not clearly evident on today's chest
x-ray. The heart and pulmonary vascularity are normal. The
mediastinum is normal in width. There is no pleural effusion. The
bony thorax is unremarkable.
IMPRESSION: There is no active cardiopulmonary disease. A known pulmonary nodule
in the right lower lobe is not evident on this chest x-ray and will
merit follow-up with chest CT scanning.

## 2017-10-18 ENCOUNTER — Other Ambulatory Visit: Payer: Self-pay | Admitting: Orthopedic Surgery

## 2017-10-18 DIAGNOSIS — M5416 Radiculopathy, lumbar region: Secondary | ICD-10-CM

## 2017-11-09 ENCOUNTER — Ambulatory Visit
Admission: RE | Admit: 2017-11-09 | Discharge: 2017-11-09 | Disposition: A | Discharge status: Home / Self Care | Payer: Medicare Other | Source: Ambulatory Visit | Attending: Orthopedic Surgery | Admitting: Orthopedic Surgery

## 2017-11-09 DIAGNOSIS — M5416 Radiculopathy, lumbar region: Secondary | ICD-10-CM

## 2017-11-09 MED ORDER — DIAZEPAM 5 MG PO TABS
5.0000 mg | ORAL_TABLET | Freq: Once | ORAL | Status: AC
  Administered 2017-11-09: 5 mg via ORAL

## 2017-11-09 MED ORDER — IOPAMIDOL (ISOVUE-M 200) INJECTION 41%
15.0000 mL | Freq: Once | INTRAMUSCULAR | Status: AC
  Administered 2017-11-09: 15 mL via INTRATHECAL

## 2017-11-09 NOTE — Discharge Instructions (Signed)

## 2017-12-22 ENCOUNTER — Other Ambulatory Visit (HOSPITAL_COMMUNITY): Payer: Self-pay | Admitting: *Deleted

## 2017-12-22 NOTE — Patient Instructions (Addendum)
Mason Scott  12/22/2017   Your procedure is scheduled on: 12-29-17  Report to [facility] Main  Entrance  Report to admitting at 800 AM    Call this number if you have problems the morning of surgery [phone]   Remember: Do not eat food or drink liquids :After Midnight.                BRING CPAP MASK AND TUBING DAY OF SURGERY  Take these medicines the morning of surgery with A SIP OF WATER: NONE                               You may not have any metal on your body including hair pins and              piercings  Do not wear jewelry, make-up, lotions, powders or perfumes, deodorant             Do not wear nail polish.  Do not shave  48 hours prior to surgery.              Men may shave face and neck.   Do not bring valuables to the hospital. [place].  Contacts, dentures or bridgework may not be worn into surgery.  Leave suitcase in the car. After surgery it may be brought to your room.                  Please read over the following fact sheets you were given: _____________________________________________________________________             [facility] - Preparing for Surgery Before surgery, you can play an important role.  Because skin is not sterile, your skin needs to be as free of germs as possible.  You can reduce the number of germs on your skin by washing with CHG (chlorahexidine gluconate) soap before surgery.  CHG is an antiseptic cleaner which kills germs and bonds with the skin to continue killing germs even after washing. Please DO NOT use if you have an allergy to CHG or antibacterial soaps.  If your skin becomes reddened/irritated stop using the CHG and inform your nurse when you arrive at Short Stay. Do not shave (including legs and underarms) for at least 48 hours prior to the first CHG shower.  You may shave your face/neck. Please follow these instructions carefully:  1.  Shower  with CHG Soap the night before surgery and the  morning of Surgery.  2.  If you choose to wash your hair, wash your hair first as usual with your  normal  shampoo.  3.  After you shampoo, rinse your hair and body thoroughly to remove the  shampoo.                           4.  Use CHG as you would any other liquid soap.  You can apply chg directly  to the skin and wash                       Gently with a scrungie or clean washcloth.  5.  Apply the CHG Soap to your body ONLY FROM THE  NECK DOWN.   Do not use on face/ open                           Wound or open sores. Avoid contact with eyes, ears mouth and genitals (private parts).                       Wash face,  Genitals (private parts) with your normal soap.             6.  Wash thoroughly, paying special attention to the area where your surgery  will be performed.  7.  Thoroughly rinse your body with warm water from the neck down.  8.  DO NOT shower/wash with your normal soap after using and rinsing off  the CHG Soap.                9.  Pat yourself dry with a clean towel.            10.  Wear clean pajamas.            11.  Place clean sheets on your bed the night of your first shower and do not  sleep with pets. Day of Surgery : Do not apply any lotions/deodorants the morning of surgery.  Please wear clean clothes to the hospital/surgery center.  FAILURE TO FOLLOW THESE INSTRUCTIONS MAY RESULT IN THE CANCELLATION OF YOUR SURGERY PATIENT SIGNATURE_________________________________  NURSE SIGNATURE__________________________________  ________________________________________________________________________   Adam Phenix  An incentive spirometer is a tool that can help keep your lungs clear and active. This tool measures how well you are filling your lungs with each breath. Taking long deep breaths may help reverse or decrease the chance of developing breathing (pulmonary) problems (especially infection) following:  A long period  of time when you are unable to move or be active. BEFORE THE PROCEDURE   If the spirometer includes an indicator to show your best effort, your nurse or respiratory therapist will set it to a desired goal.  If possible, sit up straight or lean slightly forward. Try not to slouch.  Hold the incentive spirometer in an upright position. INSTRUCTIONS FOR USE  1. Sit on the edge of your bed if possible, or sit up as far as you can in bed or on a chair. 2. Hold the incentive spirometer in an upright position. 3. Breathe out normally. 4. Place the mouthpiece in your mouth and seal your lips tightly around it. 5. Breathe in slowly and as deeply as possible, raising the piston or the ball toward the top of the column. 6. Hold your breath for 3-5 seconds or for as long as possible. Allow the piston or ball to fall to the bottom of the column. 7. Remove the mouthpiece from your mouth and breathe out normally. 8. Rest for a few seconds and repeat Steps 1 through 7 at least 10 times every 1-2 hours when you are awake. Take your time and take a few normal breaths between deep breaths. 9. The spirometer may include an indicator to show your best effort. Use the indicator as a goal to work toward during each repetition. 10. After each set of 10 deep breaths, practice coughing to be sure your lungs are clear. If you have an incision (the cut made at the time of surgery), support your incision when coughing by placing a pillow or rolled up towels firmly against it. Once you are able  to get out of bed, walk around indoors and cough well. You may stop using the incentive spirometer when instructed by your caregiver.  RISKS AND COMPLICATIONS  Take your time so you do not get dizzy or light-headed.  If you are in pain, you may need to take or ask for pain medication before doing incentive spirometry. It is harder to take a deep breath if you are having pain. AFTER USE  Rest and breathe slowly and easily.  It  can be helpful to keep track of a log of your progress. Your caregiver can provide you with a simple table to help with this. If you are using the spirometer at home, follow these instructions: [place] IF:   You are having difficultly using the spirometer.  You have trouble using the spirometer as often as instructed.  Your pain medication is not giving enough relief while using the spirometer.  You develop fever of 100.5 F (38.1 C) or higher. SEEK IMMEDIATE MEDICAL CARE IF:   You cough up bloody sputum that had not been present before.  You develop fever of 102 F (38.9 C) or greater.  You develop worsening pain at or near the incision site. MAKE SURE YOU:   Understand these instructions.  Will watch your condition.  Will get help right away if you are not doing well or get worse. Document Released: 11/02/2006 Document Revised: 09/14/2011 Document Reviewed: 01/03/2007 [facility] Patient Information 2014 [place], [place].   ________________________________________________________________________

## 2017-12-22 NOTE — Progress Notes (Signed)
[place] 12-10-27 ON CHART

## 2017-12-23 ENCOUNTER — Ambulatory Visit (HOSPITAL_COMMUNITY)
Admission: RE | Admit: 2017-12-23 | Discharge: 2017-12-23 | Disposition: A | Discharge status: Home / Self Care | Payer: Medicare Other | Source: Ambulatory Visit | Attending: Surgical | Admitting: Surgical

## 2017-12-23 ENCOUNTER — Encounter (HOSPITAL_COMMUNITY)
Admission: RE | Admit: 2017-12-23 | Discharge: 2017-12-23 | Disposition: A | Discharge status: Home / Self Care | Payer: Medicare Other | Source: Ambulatory Visit | Attending: Orthopedic Surgery | Admitting: Orthopedic Surgery

## 2017-12-23 ENCOUNTER — Encounter (HOSPITAL_COMMUNITY): Payer: Self-pay

## 2017-12-23 ENCOUNTER — Other Ambulatory Visit: Payer: Self-pay

## 2017-12-23 DIAGNOSIS — M545 Low back pain, unspecified: Secondary | ICD-10-CM

## 2017-12-23 DIAGNOSIS — I4510 Unspecified right bundle-branch block: Secondary | ICD-10-CM | POA: Diagnosis not present

## 2017-12-23 DIAGNOSIS — Z01810 Encounter for preprocedural cardiovascular examination: Secondary | ICD-10-CM | POA: Diagnosis not present

## 2017-12-23 DIAGNOSIS — I10 Essential (primary) hypertension: Secondary | ICD-10-CM | POA: Diagnosis not present

## 2017-12-23 DIAGNOSIS — M5136 Other intervertebral disc degeneration, lumbar region: Secondary | ICD-10-CM | POA: Diagnosis not present

## 2017-12-23 DIAGNOSIS — Z01812 Encounter for preprocedural laboratory examination: Secondary | ICD-10-CM | POA: Diagnosis not present

## 2017-12-23 DIAGNOSIS — R001 Bradycardia, unspecified: Secondary | ICD-10-CM | POA: Insufficient documentation

## 2017-12-23 HISTORY — DX: Malignant neoplasm of unspecified kidney, except renal pelvis: C64.9

## 2017-12-23 HISTORY — DX: Gastro-esophageal reflux disease without esophagitis: K21.9

## 2017-12-23 LAB — PROTIME-INR
INR: 1.02
Prothrombin Time: 13.3 seconds (ref 11.4–15.2)

## 2017-12-23 LAB — APTT: aPTT: 30 seconds (ref 24–36)

## 2017-12-23 LAB — SURGICAL PCR SCREEN
MRSA, PCR: NEGATIVE
STAPHYLOCOCCUS AUREUS: NEGATIVE

## 2017-12-28 MED ORDER — BUPIVACAINE LIPOSOME 1.3 % IJ SUSP
20.0000 mL | Freq: Once | INTRAMUSCULAR | Status: AC
  Filled 2017-12-28: qty 20

## 2017-12-29 ENCOUNTER — Encounter (HOSPITAL_COMMUNITY): Payer: Self-pay

## 2017-12-29 ENCOUNTER — Ambulatory Visit (HOSPITAL_COMMUNITY): Payer: Medicare Other | Admitting: Anesthesiology

## 2017-12-29 ENCOUNTER — Other Ambulatory Visit: Payer: Self-pay

## 2017-12-29 ENCOUNTER — Ambulatory Visit (HOSPITAL_COMMUNITY): Payer: Medicare Other

## 2017-12-29 ENCOUNTER — Observation Stay (HOSPITAL_COMMUNITY)
Admission: RE | Admit: 2017-12-29 | Discharge: 2017-12-30 | Disposition: A | Discharge status: Home / Self Care | Payer: Medicare Other | Source: Ambulatory Visit | Attending: Orthopedic Surgery | Admitting: Orthopedic Surgery

## 2017-12-29 ENCOUNTER — Encounter (HOSPITAL_COMMUNITY)
Admission: RE | Disposition: A | Discharge status: Home / Self Care | Payer: Self-pay | Source: Ambulatory Visit | Attending: Orthopedic Surgery

## 2017-12-29 DIAGNOSIS — Z79899 Other long term (current) drug therapy: Secondary | ICD-10-CM | POA: Diagnosis not present

## 2017-12-29 DIAGNOSIS — Z7982 Long term (current) use of aspirin: Secondary | ICD-10-CM | POA: Diagnosis not present

## 2017-12-29 DIAGNOSIS — Z419 Encounter for procedure for purposes other than remedying health state, unspecified: Secondary | ICD-10-CM

## 2017-12-29 DIAGNOSIS — N189 Chronic kidney disease, unspecified: Secondary | ICD-10-CM | POA: Insufficient documentation

## 2017-12-29 DIAGNOSIS — G4733 Obstructive sleep apnea (adult) (pediatric): Secondary | ICD-10-CM | POA: Diagnosis not present

## 2017-12-29 DIAGNOSIS — Z8551 Personal history of malignant neoplasm of bladder: Secondary | ICD-10-CM | POA: Insufficient documentation

## 2017-12-29 DIAGNOSIS — E785 Hyperlipidemia, unspecified: Secondary | ICD-10-CM | POA: Diagnosis not present

## 2017-12-29 DIAGNOSIS — K219 Gastro-esophageal reflux disease without esophagitis: Secondary | ICD-10-CM | POA: Diagnosis not present

## 2017-12-29 DIAGNOSIS — Z85528 Personal history of other malignant neoplasm of kidney: Secondary | ICD-10-CM | POA: Insufficient documentation

## 2017-12-29 DIAGNOSIS — M7138 Other bursal cyst, other site: Secondary | ICD-10-CM | POA: Diagnosis not present

## 2017-12-29 DIAGNOSIS — Z8546 Personal history of malignant neoplasm of prostate: Secondary | ICD-10-CM | POA: Diagnosis not present

## 2017-12-29 DIAGNOSIS — M48061 Spinal stenosis, lumbar region without neurogenic claudication: Secondary | ICD-10-CM | POA: Diagnosis present

## 2017-12-29 DIAGNOSIS — I129 Hypertensive chronic kidney disease with stage 1 through stage 4 chronic kidney disease, or unspecified chronic kidney disease: Secondary | ICD-10-CM | POA: Diagnosis not present

## 2017-12-29 DIAGNOSIS — M48062 Spinal stenosis, lumbar region with neurogenic claudication: Principal | ICD-10-CM | POA: Diagnosis present

## 2017-12-29 HISTORY — PX: LUMBAR LAMINECTOMY/DECOMPRESSION MICRODISCECTOMY: SHX5026

## 2017-12-29 SURGERY — LUMBAR LAMINECTOMY/DECOMPRESSION MICRODISCECTOMY
Anesthesia: General

## 2017-12-29 MED ORDER — LACTATED RINGERS IV SOLN
INTRAVENOUS | Status: DC
  Administered 2017-12-29: 18:00:00 via INTRAVENOUS

## 2017-12-29 MED ORDER — THROMBIN 5000 UNITS EX SOLR: CUTANEOUS | Status: DC | PRN

## 2017-12-29 MED ORDER — THROMBIN (RECOMBINANT) 5000 UNITS EX SOLR
CUTANEOUS | Status: AC
  Filled 2017-12-29: qty 5000

## 2017-12-29 MED ORDER — HYDROCODONE-ACETAMINOPHEN 5-325 MG PO TABS
1.0000 | ORAL_TABLET | ORAL | Status: DC | PRN
  Administered 2017-12-29 – 2017-12-30 (×4): 1 via ORAL
  Filled 2017-12-29 (×4): qty 1

## 2017-12-29 MED ORDER — SUGAMMADEX SODIUM 500 MG/5ML IV SOLN
INTRAVENOUS | Status: AC
  Filled 2017-12-29: qty 5

## 2017-12-29 MED ORDER — PROPOFOL 10 MG/ML IV BOLUS
INTRAVENOUS | Status: AC
  Filled 2017-12-29: qty 20

## 2017-12-29 MED ORDER — HYDROMORPHONE HCL 1 MG/ML IJ SOLN: 0.5000 mg | INTRAMUSCULAR | Status: DC | PRN

## 2017-12-29 MED ORDER — FENTANYL CITRATE (PF) 100 MCG/2ML IJ SOLN
INTRAMUSCULAR | Status: AC
  Filled 2017-12-29: qty 2

## 2017-12-29 MED ORDER — ACETAMINOPHEN 650 MG RE SUPP: 650.0000 mg | RECTAL | Status: DC | PRN

## 2017-12-29 MED ORDER — MENTHOL 3 MG MT LOZG: 1.0000 | LOZENGE | OROMUCOSAL | Status: DC | PRN

## 2017-12-29 MED ORDER — GLYCOPYRROLATE PF 0.2 MG/ML IJ SOSY
PREFILLED_SYRINGE | INTRAMUSCULAR | Status: DC | PRN
  Administered 2017-12-29: .2 mg via INTRAVENOUS

## 2017-12-29 MED ORDER — CEFAZOLIN SODIUM-DEXTROSE 1-4 GM/50ML-% IV SOLN
1.0000 g | Freq: Three times a day (TID) | INTRAVENOUS | Status: DC
  Administered 2017-12-29 – 2017-12-30 (×2): 1 g via INTRAVENOUS
  Filled 2017-12-29 (×2): qty 50

## 2017-12-29 MED ORDER — THROMBIN 5000 UNITS EX SOLR
CUTANEOUS | Status: DC | PRN
  Administered 2017-12-29: 5000 [IU] via TOPICAL

## 2017-12-29 MED ORDER — GLYCOPYRROLATE 0.2 MG/ML IV SOSY
PREFILLED_SYRINGE | INTRAVENOUS | Status: AC
  Filled 2017-12-29: qty 5

## 2017-12-29 MED ORDER — LIDOCAINE 2% (20 MG/ML) 5 ML SYRINGE
INTRAMUSCULAR | Status: AC
  Filled 2017-12-29: qty 10

## 2017-12-29 MED ORDER — BISACODYL 5 MG PO TBEC: 5.0000 mg | DELAYED_RELEASE_TABLET | Freq: Every day | ORAL | Status: DC | PRN

## 2017-12-29 MED ORDER — POLYETHYLENE GLYCOL 3350 17 G PO PACK: 17.0000 g | PACK | Freq: Every day | ORAL | Status: DC | PRN

## 2017-12-29 MED ORDER — METHOCARBAMOL 1000 MG/10ML IJ SOLN
500.0000 mg | Freq: Four times a day (QID) | INTRAVENOUS | Status: DC | PRN
  Administered 2017-12-29: 500 mg via INTRAVENOUS
  Filled 2017-12-29: qty 550

## 2017-12-29 MED ORDER — VALSARTAN-HYDROCHLOROTHIAZIDE 160-25 MG PO TABS: 0.5000 | ORAL_TABLET | Freq: Every morning | ORAL | Status: DC

## 2017-12-29 MED ORDER — BUPIVACAINE-EPINEPHRINE (PF) 0.5% -1:200000 IJ SOLN
INTRAMUSCULAR | Status: DC | PRN
  Administered 2017-12-29: 20 mL

## 2017-12-29 MED ORDER — POLYMYXIN B SULFATE 500000 UNITS IJ SOLR
INTRAMUSCULAR | Status: DC | PRN
  Administered 2017-12-29: 500 mL

## 2017-12-29 MED ORDER — FENTANYL CITRATE (PF) 100 MCG/2ML IJ SOLN
INTRAMUSCULAR | Status: DC | PRN
  Administered 2017-12-29 (×2): 100 ug via INTRAVENOUS

## 2017-12-29 MED ORDER — CEFAZOLIN SODIUM-DEXTROSE 2-4 GM/100ML-% IV SOLN
2.0000 g | INTRAVENOUS | Status: AC
  Administered 2017-12-29: 2 g via INTRAVENOUS
  Filled 2017-12-29: qty 100

## 2017-12-29 MED ORDER — ONDANSETRON HCL 4 MG/2ML IJ SOLN
INTRAMUSCULAR | Status: DC | PRN
  Administered 2017-12-29: 4 mg via INTRAVENOUS

## 2017-12-29 MED ORDER — ONDANSETRON HCL 4 MG PO TABS: 4.0000 mg | ORAL_TABLET | Freq: Four times a day (QID) | ORAL | Status: DC | PRN

## 2017-12-29 MED ORDER — METHOCARBAMOL 500 MG PO TABS
500.0000 mg | ORAL_TABLET | Freq: Four times a day (QID) | ORAL | Status: DC | PRN
  Administered 2017-12-29: 500 mg via ORAL
  Filled 2017-12-29: qty 1

## 2017-12-29 MED ORDER — ROCURONIUM BROMIDE 10 MG/ML (PF) SYRINGE
PREFILLED_SYRINGE | INTRAVENOUS | Status: DC | PRN
Start: 2017-12-29 — End: 2017-12-29
  Administered 2017-12-29: 60 mg via INTRAVENOUS
  Administered 2017-12-29 (×2): 10 mg via INTRAVENOUS

## 2017-12-29 MED ORDER — AMLODIPINE BESYLATE 10 MG PO TABS
10.0000 mg | ORAL_TABLET | Freq: Every evening | ORAL | Status: DC
  Administered 2017-12-29: 10 mg via ORAL
  Filled 2017-12-29: qty 1

## 2017-12-29 MED ORDER — LIDOCAINE 2% (20 MG/ML) 5 ML SYRINGE
INTRAMUSCULAR | Status: DC | PRN
  Administered 2017-12-29: 100 mg via INTRAVENOUS

## 2017-12-29 MED ORDER — EPHEDRINE SULFATE-NACL 50-0.9 MG/10ML-% IV SOSY
PREFILLED_SYRINGE | INTRAVENOUS | Status: DC | PRN
  Administered 2017-12-29 (×3): 5 mg via INTRAVENOUS

## 2017-12-29 MED ORDER — FLEET ENEMA 7-19 GM/118ML RE ENEM: 1.0000 | ENEMA | Freq: Once | RECTAL | Status: DC | PRN

## 2017-12-29 MED ORDER — HYDROCHLOROTHIAZIDE 12.5 MG PO CAPS
12.5000 mg | ORAL_CAPSULE | Freq: Every day | ORAL | Status: DC
  Administered 2017-12-30: 12.5 mg via ORAL
  Filled 2017-12-29: qty 1

## 2017-12-29 MED ORDER — BACITRACIN-NEOMYCIN-POLYMYXIN 400-5-5000 EX OINT
TOPICAL_OINTMENT | CUTANEOUS | Status: DC | PRN
  Administered 2017-12-29: 1 via TOPICAL

## 2017-12-29 MED ORDER — MEPERIDINE HCL 50 MG/ML IJ SOLN: 6.2500 mg | INTRAMUSCULAR | Status: DC | PRN

## 2017-12-29 MED ORDER — ROCURONIUM BROMIDE 100 MG/10ML IV SOLN
INTRAVENOUS | Status: AC
  Filled 2017-12-29: qty 1

## 2017-12-29 MED ORDER — SODIUM CHLORIDE 0.9 % IV SOLN
INTRAVENOUS | Status: AC
  Filled 2017-12-29: qty 500000

## 2017-12-29 MED ORDER — BUPIVACAINE LIPOSOME 1.3 % IJ SUSP
INTRAMUSCULAR | Status: DC | PRN
  Administered 2017-12-29: 20 mL

## 2017-12-29 MED ORDER — BACITRACIN-NEOMYCIN-POLYMYXIN 400-5-5000 EX OINT
TOPICAL_OINTMENT | CUTANEOUS | Status: AC
  Filled 2017-12-29: qty 1

## 2017-12-29 MED ORDER — MIDAZOLAM HCL 2 MG/2ML IJ SOLN
INTRAMUSCULAR | Status: AC
  Filled 2017-12-29: qty 2

## 2017-12-29 MED ORDER — ONDANSETRON HCL 4 MG/2ML IJ SOLN: 4.0000 mg | Freq: Four times a day (QID) | INTRAMUSCULAR | Status: DC | PRN

## 2017-12-29 MED ORDER — PROPOFOL 10 MG/ML IV BOLUS
INTRAVENOUS | Status: DC | PRN
  Administered 2017-12-29: 120 mg via INTRAVENOUS

## 2017-12-29 MED ORDER — IRBESARTAN 75 MG PO TABS
75.0000 mg | ORAL_TABLET | Freq: Every day | ORAL | Status: DC
  Administered 2017-12-30: 75 mg via ORAL
  Filled 2017-12-29: qty 1

## 2017-12-29 MED ORDER — HYDROCODONE-ACETAMINOPHEN 10-325 MG PO TABS: 2.0000 | ORAL_TABLET | ORAL | Status: DC | PRN

## 2017-12-29 MED ORDER — FENTANYL CITRATE (PF) 100 MCG/2ML IJ SOLN
25.0000 ug | INTRAMUSCULAR | Status: DC | PRN
  Administered 2017-12-29 (×3): 50 ug via INTRAVENOUS

## 2017-12-29 MED ORDER — EPHEDRINE 5 MG/ML INJ
INTRAVENOUS | Status: AC
  Filled 2017-12-29: qty 10

## 2017-12-29 MED ORDER — LACTATED RINGERS IV SOLN
INTRAVENOUS | Status: DC
  Administered 2017-12-29 (×2): via INTRAVENOUS

## 2017-12-29 MED ORDER — CHLORHEXIDINE GLUCONATE 4 % EX LIQD: 60.0000 mL | Freq: Once | CUTANEOUS | Status: DC

## 2017-12-29 MED ORDER — BUPIVACAINE-EPINEPHRINE (PF) 0.5% -1:200000 IJ SOLN
INTRAMUSCULAR | Status: AC
  Filled 2017-12-29: qty 30

## 2017-12-29 MED ORDER — ACETAMINOPHEN 325 MG PO TABS: 650.0000 mg | ORAL_TABLET | ORAL | Status: DC | PRN

## 2017-12-29 MED ORDER — MIDAZOLAM HCL 5 MG/5ML IJ SOLN
INTRAMUSCULAR | Status: DC | PRN
  Administered 2017-12-29: 2 mg via INTRAVENOUS

## 2017-12-29 MED ORDER — SUGAMMADEX SODIUM 500 MG/5ML IV SOLN
INTRAVENOUS | Status: DC | PRN
  Administered 2017-12-29: 300 mg via INTRAVENOUS

## 2017-12-29 MED ORDER — PHENOL 1.4 % MT LIQD: 1.0000 | OROMUCOSAL | Status: DC | PRN

## 2017-12-29 MED ORDER — DEXAMETHASONE SODIUM PHOSPHATE 10 MG/ML IJ SOLN
INTRAMUSCULAR | Status: DC | PRN
  Administered 2017-12-29: 10 mg via INTRAVENOUS

## 2017-12-29 SURGICAL SUPPLY — 57 items
AGENT HMST SPONGE THK3/8 (HEMOSTASIS) ×1
APL SKNCLS STERI-STRIP NONHPOA (GAUZE/BANDAGES/DRESSINGS)
BAG SPEC THK2 15X12 ZIP CLS (MISCELLANEOUS)
BAG ZIPLOCK 12X15 (MISCELLANEOUS) IMPLANT
BENZOIN TINCTURE PRP APPL 2/3 (GAUZE/BANDAGES/DRESSINGS) ×1 IMPLANT
CLEANER TIP ELECTROSURG 2X2 (MISCELLANEOUS) ×3 IMPLANT
COVER SURGICAL LIGHT HANDLE (MISCELLANEOUS) ×3 IMPLANT
DRAIN PENROSE 18X1/4 LTX STRL (WOUND CARE) IMPLANT
DRAPE MICROSCOPE LEICA (MISCELLANEOUS) ×3 IMPLANT
DRAPE POUCH INSTRU U-SHP 10X18 (DRAPES) ×3 IMPLANT
DRAPE SHEET LG 3/4 BI-LAMINATE (DRAPES) ×5 IMPLANT
DRAPE SURG 17X11 SM STRL (DRAPES) ×3 IMPLANT
DRSG ADAPTIC 3X8 NADH LF (GAUZE/BANDAGES/DRESSINGS) ×3 IMPLANT
DRSG PAD ABDOMINAL 8X10 ST (GAUZE/BANDAGES/DRESSINGS) ×10 IMPLANT
DURAPREP 26ML APPLICATOR (WOUND CARE) ×3 IMPLANT
ELECT BLADE TIP CTD 4 INCH (ELECTRODE) ×3 IMPLANT
ELECT REM PT RETURN 15FT ADLT (MISCELLANEOUS) ×3 IMPLANT
GAUZE SPONGE 4X4 12PLY STRL (GAUZE/BANDAGES/DRESSINGS) ×3 IMPLANT
GLOVE BIOGEL PI IND STRL 6.5 (GLOVE) ×1 IMPLANT
GLOVE BIOGEL PI IND STRL 7.0 (GLOVE) IMPLANT
GLOVE BIOGEL PI IND STRL 7.5 (GLOVE) IMPLANT
GLOVE BIOGEL PI IND STRL 8.5 (GLOVE) ×1 IMPLANT
GLOVE BIOGEL PI INDICATOR 6.5 (GLOVE) ×2
GLOVE BIOGEL PI INDICATOR 7.0 (GLOVE) ×2
GLOVE BIOGEL PI INDICATOR 7.5 (GLOVE) ×2
GLOVE BIOGEL PI INDICATOR 8.5 (GLOVE) ×2
GLOVE ECLIPSE 8.0 STRL XLNG CF (GLOVE) ×6 IMPLANT
GLOVE SURG SS PI 6.5 STRL IVOR (GLOVE) ×3 IMPLANT
GLOVE SURG SS PI 7.5 STRL IVOR (GLOVE) ×2 IMPLANT
GOWN STRL REUS W/ TWL LRG LVL3 (GOWN DISPOSABLE) ×1 IMPLANT
GOWN STRL REUS W/TWL LRG LVL3 (GOWN DISPOSABLE) ×3
GOWN STRL REUS W/TWL XL LVL3 (GOWN DISPOSABLE) ×6 IMPLANT
HEMOSTAT SPONGE AVITENE ULTRA (HEMOSTASIS) ×3 IMPLANT
KIT BASIN OR (CUSTOM PROCEDURE TRAY) ×3 IMPLANT
KIT POSITIONING SURG ANDREWS (MISCELLANEOUS) ×3 IMPLANT
MANIFOLD NEPTUNE II (INSTRUMENTS) ×3 IMPLANT
MARKER SKIN DUAL TIP RULER LAB (MISCELLANEOUS) ×3 IMPLANT
NDL SPNL 18GX3.5 QUINCKE PK (NEEDLE) ×2 IMPLANT
NEEDLE HYPO 22GX1.5 SAFETY (NEEDLE) ×6 IMPLANT
NEEDLE SPNL 18GX3.5 QUINCKE PK (NEEDLE) ×9 IMPLANT
PACK LAMINECTOMY ORTHO (CUSTOM PROCEDURE TRAY) ×3 IMPLANT
PAD ABD 8X10 STRL (GAUZE/BANDAGES/DRESSINGS) ×2 IMPLANT
PATTIES SURGICAL .5 X.5 (GAUZE/BANDAGES/DRESSINGS) IMPLANT
PATTIES SURGICAL .75X.75 (GAUZE/BANDAGES/DRESSINGS) ×3 IMPLANT
PATTIES SURGICAL 1X1 (DISPOSABLE) IMPLANT
PIN SAFETY NICK PLATE  2 MED (MISCELLANEOUS)
PIN SAFETY NICK PLATE 2 MED (MISCELLANEOUS) IMPLANT
SPONGE LAP 4X18 RFD (DISPOSABLE) ×4 IMPLANT
STAPLER VISISTAT 35W (STAPLE) ×3 IMPLANT
SUT VIC AB 1 CT1 27 (SUTURE) ×6
SUT VIC AB 1 CT1 27XBRD ANTBC (SUTURE) ×2 IMPLANT
SUT VIC AB 2-0 CT1 27 (SUTURE) ×6
SUT VIC AB 2-0 CT1 TAPERPNT 27 (SUTURE) ×2 IMPLANT
SYR 20CC LL (SYRINGE) ×6 IMPLANT
TAPE CLOTH SURG 4X10 WHT LF (GAUZE/BANDAGES/DRESSINGS) ×2 IMPLANT
TOWEL OR 17X26 10 PK STRL BLUE (TOWEL DISPOSABLE) ×3 IMPLANT
TOWEL OR NON WOVEN STRL DISP B (DISPOSABLE) ×2 IMPLANT

## 2017-12-29 NOTE — Anesthesia Postprocedure Evaluation (Signed)
Anesthesia Post Note  Patient: Mason Scott  Procedure(s) Performed: Central decompresssion lumbar laminectomy L4-5 for spinal stenosis; excision of synovial cyst L4-5 on left, Foraminotomy for L4 and L5 nerve root on left (N/A )     Patient location during evaluation: PACU Anesthesia Type: General Level of consciousness: awake and alert Pain management: pain level controlled Vital Signs Assessment: post-procedure vital signs reviewed and stable Respiratory status: spontaneous breathing, nonlabored ventilation, respiratory function stable and patient connected to nasal cannula oxygen Cardiovascular status: blood pressure returned to baseline and stable Postop Assessment: no apparent nausea or vomiting Anesthetic complications: no    Last Vitals:  Vitals:   12/29/17 1245 12/29/17 1300  BP: 122/87 138/72  Pulse: (!) 56 65  Resp: 13 (!) 21  Temp:    SpO2: 100% 100%    Last Pain:  Vitals:   12/29/17 1245  TempSrc:   PainSc: 2                  Maricel Swartzendruber

## 2017-12-29 NOTE — H&P (Signed)
Mason Scott is an 72 y.o. male.   Chief Complaint: Back HPI: Progressive Back and Left Leg Pain  Past Medical History:  Diagnosis Date  . Arthritis    knuckles  . Bladder cancer ([place]) DX 2014   37 BCG TX IN BLADDER  . Cancer of kidney ([place]) 02/2015  . Chronic kidney disease AUG 2017 RIGHT LEFT REMOVED NOV 2016   kidney mass right  . Erectile dysfunction    Mild  . GERD (gastroesophageal reflux disease)    NONE RECENT  . Hyperlipidemia   . Hypertension   . OSA on CPAP    cpap- setting at 9   . Polio AGE 14   RIGHT FOOT DROP  . Prostate cancer ([place]) 05/23/12   history prior seed implant.     Past Surgical History:  Procedure Laterality Date  . BASAL CELL CARCINOMA EXCISION  02/27/14   Removed and Biopsed from back x 2, rt ear, Lt hand   . CYSTOSCOPY N/A 09/29/2012   Procedure: CYSTOSCOPY FLEXIBLE;  Surgeon: Dutch Gray, MD;  Location: [facility];  Service: Urology;  Laterality: N/A;  no seeds found in bladder  . CYSTOSCOPY N/A 02/21/2015   Procedure: CYSTOSCOPY FLEXIBLE;  Surgeon: Raynelle Bring, MD;  Location: [facility];  Service: Urology;  Laterality: N/A;  . CYSTOSCOPY N/A 05/27/2015   Procedure: CYSTOSCOPY FLEXIBLE;  Surgeon: Raynelle Bring, MD;  Location: [facility];  Service: Urology;  Laterality: N/A;  . CYSTOSCOPY W/ RETROGRADES N/A 05/25/2016   Procedure: CYSTOSCOPY WITH RETROGRADE PYELOGRAM, BLADDER BIOPSIES;  Surgeon: Raynelle Bring, MD;  Location: [facility];  Service: Urology;  Laterality: N/A;  . CYSTOSCOPY WITH BIOPSY N/A 10/23/2013   Procedure: CYSTOSCOPY WITH BIOPSY, bilateral retrograde, transurethral resection of  bladder tumor ;  Surgeon: Dutch Gray, MD;  Location: [facility];  Service: Urology;  Laterality: N/A;  . CYSTOSCOPY WITH BIOPSY N/A 03/08/2014   Procedure: CYSTOSCOPY WITH BIOPSY;  Surgeon: Raynelle Bring, MD;  Location: [facility];  Service: Urology;  Laterality: N/A;  . CYSTOSCOPY WITH BIOPSY N/A 11/12/2014   Procedure: CYSTOSCOPY WITH BLADDER BIOPSIES;   Surgeon: Raynelle Bring, MD;  Location: [facility];  Service: Urology;  Laterality: N/A;  . CYSTOSCOPY WITH BIOPSY Bilateral 01/16/2016   Procedure: CYSTOSCOPY WITH BLADDER BIOPSY,  BILATERAL RETROGRADE PYELOGRAM;  Surgeon: Raynelle Bring, MD;  Location: [facility];  Service: Urology;  Laterality: Bilateral;  . GANGLION CYST EXCISION N/A 06/08/2001   AND 10-19-2001  RE-EXCISION RECURRENT CYST   . INSERTION OF MESH N/A 01/15/2017   Procedure: INSERTION OF MESH;  Surgeon: Michael Boston, MD;  Location: [facility];  Service: General;  Laterality: N/A;  . PILONIDAL CYST EXCISION  2004  . PROSTATE BIOPSY  05/23/12  . RADIOACTIVE SEED IMPLANT N/A 09/29/2012   Procedure: RADIOACTIVE SEED IMPLANT;  Surgeon: Dutch Gray, MD;  Location: [facility];  Service: Urology;  Laterality: N/A;  66    seeds implanted  . ROBOTIC ASSITED PARTIAL NEPHRECTOMY Right 02/21/2015   Procedure: ROBOTIC ASSITED PARTIAL NEPHRECTOMY;  Surgeon: Raynelle Bring, MD;  Location: [facility];  Service: Urology;  Laterality: Right;  . ROBOTIC ASSITED PARTIAL NEPHRECTOMY Left 05/27/2015   Procedure: ROBOTIC ASSISTED PARTIAL NEPHRECTOMY;  Surgeon: Raynelle Bring, MD;  Location: [facility];  Service: Urology;  Laterality: Left;  . TRANSURETHRAL RESECTION OF BLADDER TUMOR  03/1990  . VENTRAL HERNIA REPAIR N/A 01/15/2017   Procedure: LAPAROSCOPIC VENTRAL HERNIA REPAIR WITH MESH;  Surgeon: Michael Boston, MD;  Location: [facility];  Service: General;  Laterality: N/A;  ERAS PATHWAY BILATERAL TAP BLOCK    Family History  Problem Relation Age of Onset  . Stroke Father   . Congenital heart disease Mother        Killed MVA   Social History:  reports that he has never smoked. He has never used smokeless tobacco. He reports that he does not drink alcohol or use drugs.  Allergies: No Known Allergies  Medications Prior to Admission  Medication Sig Dispense Refill  . amLODipine (NORVASC) 10 MG tablet Take 10 mg by mouth every evening.     Marland Kitchen aspirin EC 81 MG  tablet Take 81 mg by mouth daily.    . meloxicam (MOBIC) 15 MG tablet Take 7.5 mg by mouth at bedtime.     . Omega-3 Fatty Acids (FISH OIL OMEGA-3) 1000 MG CAPS Take 1,000 mg by mouth every evening.     . simvastatin (ZOCOR) 10 MG tablet Take 5 mg by mouth every evening.     . valsartan-hydrochlorothiazide (DIOVAN-HCT) 160-25 MG per tablet Take 0.5 tablets by mouth every morning.       No results found for this or any previous visit (from the past 48 hour(s)). No results found.  Review of Systems  Constitutional: Negative.   HENT: Negative.   Eyes: Negative.   Respiratory: Negative.   Cardiovascular: Negative.   Gastrointestinal: Negative.   Genitourinary: Negative.   Musculoskeletal: Positive for back pain.  Skin: Negative.   Neurological: Positive for focal weakness.  Endo/Heme/Allergies: Negative.   Psychiatric/Behavioral: Negative.     Blood pressure (!) 143/83, pulse (!) 47, temperature 99.2 F (37.3 C), temperature source Oral, resp. rate 18, height 5\' 10"  (1.778 m), weight 97.1 kg (214 lb), SpO2 100 %. Physical Exam  Constitutional: He appears well-developed.  HENT:  Head: Normocephalic.  Eyes: Pupils are equal, round, and reactive to light.  Neck: Normal range of motion.  Cardiovascular: Normal rate.  Respiratory: Effort normal.  GI: Soft.  Musculoskeletal: He exhibits tenderness.  Neurological:  Weakness of Left Foot     Assessment/Plan Decompressive Lumbar Laminectomy and Microdiscectomy at L-4-L-5 on the left.  Latanya Maudlin, MD 12/29/2017, 9:27 AM

## 2017-12-29 NOTE — Op Note (Signed)
NAME: Mason Scott, Mason Scott MEDICAL RECORD SA:[id number] ACCOUNT [id number] DATE OF BIRTH:[birth year] FACILITY: [facility] LOCATION: [facility] PHYSICIAN:Janautica Netzley Fransico Setters, MD  OPERATIVE REPORT  DATE OF PROCEDURE:  12/29/2017  SURGEON:  Latanya Maudlin, MD  ASSISTANT:  Ardeen Jourdain, PA.  PREOPERATIVE DIAGNOSES: 1.  Deflated synovial cyst L4-L5 on the left. 2.  Severe foraminal stenosis involving the L4 root on the left. 3.  Severe foraminal stenosis involving the L5 root on the left. 4.  Severe spinal stenosis at L4-L5.  The patient had all left leg pain.  POSTOPERATIVE DIAGNOSIS:   1.  Deflated synovial cyst L4-L5 on the left. 2.  Severe foraminal stenosis involving the L4 root on the left. 3.  Severe foraminal stenosis involving the L5 root on the left. 4.  Severe spinal stenosis at L4-L5.  The patient had all left leg pain.  OPERATION: 1.  A complete decompressive lumbar laminectomy at L4-L5 for spinal stenosis. 2.  Decompression of the lateral recess at L4-L5 on the left for recess stenosis. 3.  Foraminotomy involving the L4 root on the left. 4.  Foraminotomy of the L5 root on the left. 5.  Excision of the remainder of the synovial cyst L4-L5 on the left.  PROCEDURE:  Under general anesthesia, routine orthopedic prep and draping was carried out with the patient on a spinal frame.  The appropriate timeout was first carried out.  I also marked the appropriate left side of his back in holding area.  At this  time, after timeout 2 needles were placed in the back for localization purposes.  X-ray was taken.  After that, an incision was made over the L4-L5 space.  It was extended proximally and distally in the usual fashion.  At that particular time, we then  separated the muscle from the lamina and spinous processes.  Another x-ray was taken with a Kocher clamp in place.  We then went down and repeated another x-ray at the L4-L5 space.  At that time, the [facility] retractors were  inserted.  We can continue  the cleansing of the lamina.  Good hemostasis was maintained.  We then did a removal of the spinous process of L4 and went down centrally.  We removed a portion of L5 proximally and distally.  At this time, after getting down to the ligamentum flavum,  the microscope was brought in.  We then went up and completed our decompression proximally and distally until we had freedom in both directions.  The main issue was the severe recess stenosis.  It looked as though there was a deflated type of synovial  cyst on the dura.  We gently peeled that material back without injuring the nerve root.  We identified the nerve root and a large vein in that area.  We went out and further decompressed the lateral recess as we retracted the root.  Another x-ray was  taken with the instrument on the disk.  We explored that disk and there was no herniation.  The main issue was the stenosis.  So we continued down to remove the ligamentum flavum and it literally was stuck down to the dura and root.  We were very careful  dissecting it free.  We then went out into the foramen and did a nice foraminotomy distally as well.  Once this is done, we can freely move the root, we could pass a hockey stick distally and proximally without any problem.  We thoroughly irrigated out  the area and loosely applied some Gelfoam.  The wound was closed in layers in the usual fashion except for the small distal deep part of the wound was left open for drainage purposes.  We then closed the remaining part of the wound.  At the end of the  procedure, we injected 20 mL of Exparel.  At the beginning of the procedure, we injected 20 mL of 0.5% Marcaine and epinephrine to control the bleeding.  Sterile dressings were applied.  The patient left the operating room in satisfactory condition.  He  had 2 grams IV Ancef preop.  TN/NUANCE  D:12/29/2017 T:12/29/2017 JOB:[id number]

## 2017-12-29 NOTE — Brief Op Note (Signed)
12/29/2017  11:45 AM  PATIENT:  Mason Scott  73 y.o. male  PRE-OPERATIVE DIAGNOSIS:  Spinal Stenosis L-4-L-5 and severe Foraminal Stenosis involving the L-4 and L-5 nerve Roots on the left. Synovial Cyst at L-4-L-5 on the left.  POST-OPERATIVE DIAGNOSIS:  Spinal Stenosis L4-L5 and Foraminal Stenosis involving L4 and L5 nerve root on left,  Synovial Cyst L4-L5 on left  PROCEDURE:  Procedure(s): Central decompresssion lumbar laminectomy L4-5 for spinal stenosis; excision of synovial cyst L4-5 on left, Foraminotomy for L4 and L5 nerve root on left (N/A) for Foraminal Stenosis.  SURGEON:  Surgeon(s) and Role:    * Latanya Maudlin, MD - Primary  PHYSICIAN ASSISTANT: Ardeen Jourdain PA  ASSISTANTS:Amber [place] PA  ANESTHESIA:   general  EBL:  100 mL   BLOOD ADMINISTERED:none  DRAINS: none   LOCAL MEDICATIONS USED:  MARCAINE 20cc of 0.50% with Epinephrine at the start of the case and 20cc of Exparel at the end of the case.    SPECIMEN:  No Specimen  DISPOSITION OF SPECIMEN:  N/A  COUNTS:  YES  TOURNIQUET:  * No tourniquets in log *  DICTATION: .Other Dictation: Dictation Number [id number]  PLAN OF CARE: Admit for overnight observation  PATIENT DISPOSITION:  PACU - hemodynamically stable.   Delay start of Pharmacological VTE agent (>24hrs) due to surgical blood loss or risk of bleeding: yes

## 2017-12-29 NOTE — Anesthesia Preprocedure Evaluation (Signed)
Anesthesia Evaluation  Patient identified by MRN, date of birth, ID band Patient awake    Airway Mallampati: II  TM Distance: >3 FB Neck ROM: Full    Dental  (+) Dental Advisory Given   Pulmonary sleep apnea and Continuous Positive Airway Pressure Ventilation ,    breath sounds clear to auscultation       Cardiovascular hypertension, Pt. on medications  Rhythm:Regular Rate:Normal     Neuro/Psych Hx polio    GI/Hepatic negative GI ROS, Neg liver ROS,   Endo/Other  negative endocrine ROS  Renal/GU CRFRenal disease     Musculoskeletal  (+) Arthritis ,   Abdominal   Peds  Hematology negative hematology ROS (+)   Anesthesia Other Findings   Reproductive/Obstetrics                             Lab Results  Component Value Date   WBC 10.2 12/29/2016   HGB 13.4 12/29/2016   HCT 38.6 (L) 12/29/2016   MCV 91.5 12/29/2016   PLT 197 12/29/2016   Lab Results  Component Value Date   CREATININE 1.18 12/29/2016   BUN 18 12/29/2016   NA 139 12/29/2016   K 3.9 12/29/2016   CL 104 12/29/2016   CO2 28 12/29/2016    Anesthesia Physical  Anesthesia Plan  ASA: III  Anesthesia Plan: General   Post-op Pain Management:  Regional for Post-op pain   Induction: Intravenous  PONV Risk Score and Plan: 4 or greater and Ondansetron, Dexamethasone, Propofol and Treatment may vary due to age or medical condition  Airway Management Planned: Oral ETT  Additional Equipment:   Intra-op Plan:   Post-operative Plan: Extubation in OR  Informed Consent: I have reviewed the patients History and Physical, chart, labs and discussed the procedure including the risks, benefits and alternatives for the proposed anesthesia with the patient or authorized representative who has indicated his/her understanding and acceptance.   Dental advisory given  Plan Discussed with: CRNA, Anesthesiologist and  Surgeon  Anesthesia Plan Comments:         Anesthesia Quick Evaluation

## 2017-12-29 NOTE — Anesthesia Procedure Notes (Signed)
Procedure Name: Intubation Date/Time: 12/29/2017 9:55 AM Performed by: Lavina Hamman, CRNA Pre-anesthesia Checklist: Patient identified, Emergency Drugs available, Suction available, Patient being monitored and Timeout performed Patient Re-evaluated:Patient Re-evaluated prior to induction Oxygen Delivery Method: Circle system utilized Preoxygenation: Pre-oxygenation with 100% oxygen Induction Type: IV induction Ventilation: Mask ventilation without difficulty Laryngoscope Size: Mac and 4 Grade View: Grade I Tube type: Oral Tube size: 7.5 mm Number of attempts: 1 Airway Equipment and Method: Stylet Placement Confirmation: ETT inserted through vocal cords under direct vision,  positive ETCO2,  CO2 detector and breath sounds checked- equal and bilateral Secured at: 22 cm Tube secured with: Tape Dental Injury: Teeth and Oropharynx as per pre-operative assessment

## 2017-12-29 NOTE — Discharge Instructions (Signed)
For the first three days, remove your dressing, and tape a piece of saran wrap over your incision Take your shower, then remove the saran wrap and put a clean dressing on, then reapply your sling. After three days you can shower without the saran wrap.  No lifting or excessive bending No driving while taking pain medications. Call Dr. Gladstone Lighter if any wound complications or temperature of 101 degrees F or over.  Call the office for an appointment to see Dr. Gladstone Lighter in two weeks: [phone] and ask for Dr. Charlestine Night nurse, Brunilda Payor.

## 2017-12-29 NOTE — Transfer of Care (Signed)
Immediate Anesthesia Transfer of Care Note  Patient: Mason Scott  Procedure(s) Performed: Procedure(s): Central decompresssion lumbar laminectomy L4-5 for spinal stenosis; excision of synovial cyst L4-5 on left, Foraminotomy for L4 and L5 nerve root on left (N/A)  Patient Location: PACU  Anesthesia Type:General  Level of Consciousness:  sedated, patient cooperative and responds to stimulation  Airway & Oxygen Therapy:Patient Spontanous Breathing and Patient connected to face mask oxgen  Post-op Assessment:  Report given to PACU RN and Post -op Vital signs reviewed and stable  Post vital signs:  Reviewed and stable  Last Vitals:  Vitals:   12/29/17 0751  BP: (!) 143/83  Pulse: (!) 47  Resp: 18  Temp: 37.3 C  SpO2: 867%    Complications: No apparent anesthesia complications

## 2017-12-30 ENCOUNTER — Encounter (HOSPITAL_COMMUNITY): Payer: Self-pay | Admitting: Orthopedic Surgery

## 2017-12-30 DIAGNOSIS — M48062 Spinal stenosis, lumbar region with neurogenic claudication: Secondary | ICD-10-CM | POA: Diagnosis not present

## 2017-12-30 MED ORDER — METHOCARBAMOL 500 MG PO TABS: 500.0000 mg | ORAL_TABLET | Freq: Four times a day (QID) | ORAL | 0 refills | Status: AC | PRN

## 2017-12-30 MED ORDER — HYDROCODONE-ACETAMINOPHEN 5-325 MG PO TABS: 1.0000 | ORAL_TABLET | ORAL | 0 refills | Status: AC | PRN

## 2017-12-30 NOTE — Progress Notes (Signed)
   Subjective: 1 Day Post-Op Procedure(s) (LRB): Central decompresssion lumbar laminectomy L4-5 for spinal stenosis; excision of synovial cyst L4-5 on left, Foraminotomy for L4 and L5 nerve root on left (N/A) Patient reports pain as mild.   Patient seen in rounds with Dr. Gladstone Lighter. Patient is well, and has had no acute complaints or problems other than mild soreness in his low back. He denies leg pain. No numbness or tingling in LE. Denies SOB and chest pain. Voiding well. Positive flatus.  We will start therapy today.  Plan is to go Home after hospital stay.  Objective: Vital signs in last 24 hours: Temp:  [97.8 F (36.6 C)-99.2 F (37.3 C)] 98 F (36.7 C) (06/27 0534) Pulse Rate:  [47-68] 67 (06/27 0534) Resp:  [6-21] 15 (06/26 2201) BP: (115-148)/(69-90) 120/74 (06/27 0534) SpO2:  [95 %-100 %] 97 % (06/27 0534) Weight:  [97.1 kg (214 lb)] 97.1 kg (214 lb) (06/26 0747)  Intake/Output from previous day:  Intake/Output Summary (Last 24 hours) at 12/30/2017 0721 Last data filed at 12/30/2017 1696 Gross per 24 hour  Intake 4274.33 ml  Output 950 ml  Net 3324.33 ml     EXAM General - Patient is Alert and Oriented Extremity - Neurologically intact Intact pulses distally Dorsiflexion/Plantar flexion intact Dressing - dressing C/D/I Motor Function - intact, moving foot and toes well on exam.    Past Medical History:  Diagnosis Date  . Arthritis    knuckles  . Bladder cancer ([place]) DX 2014   37 BCG TX IN BLADDER  . Cancer of kidney ([place]) 02/2015  . Chronic kidney disease AUG 2017 RIGHT LEFT REMOVED NOV 2016   kidney mass right  . Erectile dysfunction    Mild  . GERD (gastroesophageal reflux disease)    NONE RECENT  . Hyperlipidemia   . Hypertension   . OSA on CPAP    cpap- setting at 9   . Polio AGE 25   RIGHT FOOT DROP  . Prostate cancer ([place]) 05/23/12   history prior seed implant.     Assessment/Plan: 1 Day Post-Op Procedure(s) (LRB): Central decompresssion  lumbar laminectomy L4-5 for spinal stenosis; excision of synovial cyst L4-5 on left, Foraminotomy for L4 and L5 nerve root on left (N/A) Active Problems:   Spinal stenosis, lumbar region with neurogenic claudication  Estimated body mass index is 30.71 kg/m as calculated from the following:   Height as of this encounter: 5\' 10"  (1.778 m).   Weight as of this encounter: 97.1 kg (214 lb). Advance diet Up with therapy D/C IV fluids when tolerating POs well  DVT Prophylaxis - Aspirin Weight-Bearing as tolerated  D/C O2 and Pulse OX and try on Room Air  Will have him work with therapy this morning. If he is meeting goals, plan for DC home today. Follow up in office in 2 weeks.  Ardeen Jourdain, PA-C Orthopaedic Surgery 12/30/2017, 7:21 AM

## 2017-12-30 NOTE — Evaluation (Signed)
Physical Therapy Evaluation Patient Details Name: Mason Scott MRN: [id number] DOB: 04-08-1946 Today's Date: 12/30/2017   History of Present Illness  s/p L4-5 decompression.  H/O R footdrop 2* polio (age 72).  Kidney/bladder CA  Clinical Impression  Patient presents without much physical need for assist for mobility.  Reviewed all precautions with pt and demonstrated mobility with back precautions.  Also discussed activity level at d/c and pt stable for d/c home with family support.  No current follow up PT needs.     Follow Up Recommendations No PT follow up    Equipment Recommendations  None recommended by PT    Recommendations for Other Services       Precautions / Restrictions Precautions Precautions: Fall Precaution Comments: also educated in back precautions, OT gave handout Restrictions Weight Bearing Restrictions: No      Mobility  Bed Mobility Overal bed mobility: Modified Independent             General bed mobility comments: demonstrated appropriate technique for in/out of bed  Transfers Overall transfer level: Modified independent Equipment used: None Transfers: Sit to/from Stand Sit to Stand: Modified independent (Device/Increase time)         General transfer comment: able to use armrests appropriately and backs to bed for sitting appropriately; educated/demonstrated car transfers  Ambulation/Gait Ambulation/Gait assistance: Supervision Gait Distance (Feet): 200 Feet Assistive device: None Gait Pattern/deviations: Step-to pattern;Step-through pattern     General Gait Details: slower pace, toe walks on R (as per baseline)  Stairs Stairs: Yes Stairs assistance: Supervision Stair Management: Step to pattern;Alternating pattern;One rail Right Number of Stairs: 2(x 2) General stair comments: initially step through pattern, educated to perform step to for limiting lumbosacral motion  Wheelchair Mobility    Modified Rankin (Stroke  Patients Only)       Balance Overall balance assessment: No apparent balance deficits (not formally assessed)                                           Pertinent Vitals/Pain Pain Assessment: Faces Faces Pain Scale: Hurts a little bit Pain Location: back Pain Descriptors / Indicators: Sore Pain Intervention(s): Monitored during session;Repositioned    Home Living Family/patient expects to be discharged to:: Private residence Living Arrangements: Spouse/significant other;Children Available Help at Discharge: Family Type of Home: House Home Access: Stairs to enter Entrance Stairs-Rails: Right Entrance Stairs-Number of Steps: 3 Home Layout: One level;Able to live on main level with bedroom/bathroom Home Equipment: Tub bench;Hand held shower head;Grab bars - tub/shower;Walker - 2 wheels;Bedside commode;Cane - single point      Prior Function Level of Independence: Independent         Comments: 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     Journalist, newspaper        Extremity/Trunk Assessment   Upper Extremity Assessment Upper Extremity Assessment: Defer to OT evaluation    Lower Extremity Assessment Lower Extremity Assessment: RLE deficits/detail RLE Deficits / Details: history of foot drop from polio, walks on toes with leg length discrepancy       Communication   Communication: No difficulties  Cognition Arousal/Alertness: Awake/alert Behavior During Therapy: WFL for tasks assessed/performed Overall Cognitive Status: Within Functional Limits for tasks assessed  General Comments      Exercises     Assessment/Plan    PT Assessment Patent does not need any further PT services  PT Problem List         PT Treatment Interventions      PT Goals (Current goals can be found in the Care Plan section)  Acute Rehab PT Goals Patient Stated Goal: good recovery. Return to driving tour bus;  working around home    Frequency     Barriers to discharge        Co-evaluation               AM-PAC PT "6 Clicks" Daily Activity  Outcome Measure Difficulty turning over in bed (including adjusting bedclothes, sheets and blankets)?: A Little Difficulty moving from lying on back to sitting on the side of the bed? : A Little Difficulty sitting down on and standing up from a chair with arms (e.g., wheelchair, bedside commode, etc,.)?: A Little Help needed moving to and from a bed to chair (including a wheelchair)?: A Little Help needed walking in hospital room?: A Little Help needed climbing 3-5 steps with a railing? : A Little 6 Click Score: 18    End of Session   Activity Tolerance: Patient tolerated treatment well Patient left: with call bell/phone within reach;in chair   PT Visit Diagnosis: Other abnormalities of gait and mobility (R26.89)    Time: 1829-9371 PT Time Calculation (min) (ACUTE ONLY): 25 min   Charges:   PT Evaluation $PT Eval Low Complexity: 1 Low PT Treatments $Self Care/Home Management: 8-22   PT G CodesMagda Kiel, [place] [phone] 12/30/2017   Reginia Naas 12/30/2017, 9:54 AM

## 2017-12-30 NOTE — Discharge Summary (Signed)
Physician Discharge Summary   Patient ID: Mason Scott MRN: [id number] DOB/AGE: 07-13-1945 72 y.o.  Admit date: 12/29/2017 Discharge date: 12/30/2017  Primary Diagnosis:   Spinal Stenosis  Admission Diagnoses:  Past Medical History:  Diagnosis Date  . Arthritis    knuckles  . Bladder cancer ([place]) DX 2014   37 BCG TX IN BLADDER  . Cancer of kidney ([place]) 02/2015  . Chronic kidney disease AUG 2017 RIGHT LEFT REMOVED NOV 2016   kidney mass right  . Erectile dysfunction    Mild  . GERD (gastroesophageal reflux disease)    NONE RECENT  . Hyperlipidemia   . Hypertension   . OSA on CPAP    cpap- setting at 9   . Polio AGE 79   RIGHT FOOT DROP  . Prostate cancer ([place]) 05/23/12   history prior seed implant.    Discharge Diagnoses:   Active Problems:   Spinal stenosis, lumbar region with neurogenic claudication  Procedure:  Procedure(s) (LRB): Central decompresssion lumbar laminectomy L4-5 for spinal stenosis; excision of synovial cyst L4-5 on left, Foraminotomy for L4 and L5 nerve root on left (N/A)   Consults: None  HPI: The patient is a 72 year old male who presented with the chief complaint of low back pain. He did not have a known injury. He began to have pain that radiated into the left leg. He did not improve with conservative treatments. Imaging showed severe spinal stenosis at L4-L5 requiring decompression.    Laboratory Data: Hospital Outpatient Visit on 12/23/2017  Component Date Value Ref Range Status  . MRSA, PCR 12/23/2017 NEGATIVE  NEGATIVE Final  . Staphylococcus aureus 12/23/2017 NEGATIVE  NEGATIVE Final   Comment: (NOTE) The Xpert SA Assay (FDA approved for NASAL specimens in patients 73 years of age and older), is one component of a comprehensive surveillance program. It is not intended to diagnose infection nor to guide or monitor treatment. Performed at [facility], [place] [address], [place], [place] [address]   . aPTT  12/23/2017 30  24 - 36 seconds Final   Performed at [facility], [place] [address] [place], [place] [address]  . Prothrombin Time 12/23/2017 13.3  11.4 - 15.2 seconds Final  . INR 12/23/2017 1.02   Final   Performed at [facility], [place] [address], [place], [place] [address]    X-Rays:Dg Lumbar Spine 2-3 Views  Result Date: 12/23/2017 CLINICAL DATA:  Low back pain. EXAM: LUMBAR SPINE - 2-3 VIEW COMPARISON:  CT myelogram 11/09/2017 FINDINGS: Lumbar vertebra were numbered according to the CT myelogram and annotated in PACS. Mild retrolisthesis L2-3 and L3-4. Mild anterolisthesis L4-5. Disc degeneration with disc space narrowing and spurring L2-3, L3-4, L4-5 Negative for fracture or bone lesion IMPRESSION: Lumbar disc degeneration and spurring L2-3, L3-4, L4-5 Electronically Signed   By: Franchot Gallo M.D.   On: 12/23/2017 14:07   Dg Spine Portable 1 View  Result Date: 12/29/2017 CLINICAL DATA:  Intraoperative localization EXAM: PORTABLE SPINE - 1 VIEW COMPARISON:  Earlier the same day. There is preoperative myelogram 11/09/2017 FINDINGS: The same numbering scheme as on prior is again used. The vertebral bodies are numbered as requested. A retractor has been placed at the L4 and L5 spinous processes. A probe projects posterior to the L4-5 interspace IMPRESSION: Intraoperative localization with probe projecting at the L4-5 interspace. Electronically Signed   By: Monte Fantasia M.D.   On: 12/29/2017 12:06   Dg Spine Portable 1 View  Result Date: 12/29/2017 CLINICAL DATA:  Intraoperative imaging EXAM: PORTABLE SPINE - 1 VIEW COMPARISON:  12/23/2017 FINDINGS: A surgical instrument projects over the L4 inferior articular facet. It is at the level of the L4-5 disc. Degenerative changes are noted throughout the lumbar spine. No vertebral compression deformity. IMPRESSION: Intraoperative marking at the L4-5 disc. Electronically Signed   By: Marybelle Killings M.D.   On:  12/29/2017 11:19   Dg Spine Portable 1 View  Result Date: 12/29/2017 CLINICAL DATA:  Intraoperative localization EXAM: PORTABLE SPINE - 1 VIEW COMPARISON:  None. FINDINGS: Single cross-table lateral x-ray of the lumbar spine. Two posterior metallic instruments. The more superior metallic instrument projects along the posterior margin of the L3 spinous process. The more inferior metallic instrument projects over the L4 spinous process. Lumbar spine spondylosis. IMPRESSION: Intraoperative localization. Electronically Signed   By: Kathreen Devoid   On: 12/29/2017 10:58   Dg Spine Portable 1 View  Result Date: 12/29/2017 CLINICAL DATA:  Intraoperative localization EXAM: PORTABLE SPINE - 1 VIEW COMPARISON:  None. FINDINGS: Single cross-table lateral x-ray of the lumbar spine is provided. 2 posterior metallic needles are present. The tip of the superior metallic needle is located between L3 and L4 spinous processes. The tip of the more inferior metallic needle projects along the posterior inferior margin of the L4 spinous process. IMPRESSION: Intraoperative localization. Electronically Signed   By: Kathreen Devoid   On: 12/29/2017 10:39    EKG: Orders placed or performed during the hospital encounter of 12/23/17  . EKG 12-Lead  . EKG 12-Lead     Hospital Course: Patient was admitted to [facility] and taken to the OR and underwent the above state procedure without complications.  Patient tolerated the procedure well and was later transferred to the recovery room and then to the orthopaedic floor for postoperative care.  They were given PO and IV analgesics for pain control following their surgery.  They were given 24 hours of postoperative antibiotics.   PT was consulted postop to assist with mobility and transfers.  The patient was allowed to be WBAT with therapy and was taught back precautions. Discharge planning was consulted to help with postop disposition and equipment needs.  Patient had a good  night on the evening of surgery and started to get up OOB with therapy on day one. Patient was seen in rounds and was ready to go home on day one.  They were given discharge instructions and dressing directions.  They were instructed on when to follow up in the office with Dr. Gladstone Lighter.   Diet: Cardiac diet Activity:WBAT Follow-up:in 2 weeks Disposition - Home Discharged Condition: stable   Discharge Instructions    Call MD / Call 911   Complete by:  As directed    If you experience chest pain or shortness of breath, CALL 911 and be transported to the hospital emergency room.  If you develope a fever above 101 F, pus (white drainage) or increased drainage or redness at the wound, or calf pain, call your surgeon's office.   Constipation Prevention   Complete by:  As directed    Drink plenty of fluids.  Prune juice may be helpful.  You may use a stool softener, such as Colace (over the counter) 100 mg twice a day.  Use MiraLax (over the counter) for constipation as needed.   Diet - low sodium heart healthy   Complete by:  As directed    Discharge instructions   Complete by:  As directed    For the first three days, remove your dressing, and tape a piece of saran wrap over your incision Take your shower, then remove the saran wrap and put a clean dressing on, then reapply your sling. After three days you can shower without the saran wrap.  No lifting or excessive bending No driving while taking pain medications. Call Dr. Gladstone Lighter if any wound complications or temperature of 101 degrees F or over.  Call the office for an appointment to see Dr. Gladstone Lighter in two weeks: [phone] and ask for Dr. Charlestine Night nurse, Brunilda Payor.   Increase activity slowly as tolerated   Complete by:  As directed      Allergies as of 12/30/2017   No Known Allergies     Medication List    TAKE these medications   amLODipine 10 MG tablet Commonly known as:  NORVASC Take 10 mg by mouth every evening.     aspirin EC 81 MG tablet Take 81 mg by mouth daily.   FISH OIL OMEGA-3 1000 MG Caps Take 1,000 mg by mouth every evening.   HYDROcodone-acetaminophen 5-325 MG tablet Commonly known as:  NORCO/VICODIN Take 1 tablet by mouth every 4 (four) hours as needed for moderate pain ((score 4 to 6)).   meloxicam 15 MG tablet Commonly known as:  MOBIC Take 7.5 mg by mouth at bedtime.   methocarbamol 500 MG tablet Commonly known as:  ROBAXIN Take 1 tablet (500 mg total) by mouth every 6 (six) hours as needed for muscle spasms.   simvastatin 10 MG tablet Commonly known as:  ZOCOR Take 5 mg by mouth every evening.   valsartan-hydrochlorothiazide 160-25 MG tablet Commonly known as:  DIOVAN-HCT Take 0.5 tablets by mouth every morning.      Follow-up Information    Latanya Maudlin, MD. Schedule an appointment as soon as possible for a visit in 2 week(s).   Specialty:  Orthopedic Surgery Contact information: [address] [place] [address] [phone]           Signed: Ardeen Jourdain, PA-C Orthopaedic Surgery 12/30/2017, 7:36 AM

## 2017-12-30 NOTE — Evaluation (Signed)
Occupational Therapy Evaluation Patient Details Name: Mason Scott MRN: [id number] DOB: 1945-12-02 Today's Date: 12/30/2017    History of Present Illness s/p L4-5 decompression.  H/O R footdrop 2* polio (age 72).  Kidney/bladder CA   Clinical Impression   This 72 year old man was admitted for the above sx. All education was completed.  No further OT needs at this time    Follow Up Recommendations  Supervision/Assistance - 24 hour    Equipment Recommendations  None recommended by OT    Recommendations for Other Services       Precautions / Restrictions Precautions Precautions: Fall Restrictions Weight Bearing Restrictions: No      Mobility Bed Mobility Overal bed mobility: Modified Independent             General bed mobility comments: practiced in and out of bed via sidelying  Transfers Overall transfer level: Needs assistance Equipment used: None Transfers: Sit to/from Stand Sit to Stand: Min guard         General transfer comment: for safety; cues for back precautions    Balance Overall balance assessment: No apparent balance deficits (not formally assessed)(walks on R toes)                                         ADL either performed or assessed with clinical judgement   ADL Overall ADL's : Needs assistance/impaired Eating/Feeding: Independent   Grooming: Oral care;Supervision/safety;Standing   Upper Body Bathing: Set up;Sitting   Lower Body Bathing: Minimal assistance;Sit to/from stand;With adaptive equipment   Upper Body Dressing : Set up;Sitting   Lower Body Dressing: Moderate assistance;Sit to/from stand;With adaptive equipment;Maximal assistance   Toilet Transfer: Min guard;Ambulation;BSC;RW   Toileting- Clothing Manipulation and Hygiene: Moderate assistance;Sit to/from stand         General ADL Comments: Pt and wife had many questions.  Reviewed protocol and ADL options following back precautions. Pt has a  Secondary school teacher. Likely will need toilet aide, which I showed him and talked about option of wand from scrubbing bubbles kit     Vision         Perception     Praxis      Pertinent Vitals/Pain Pain Assessment: Faces Faces Pain Scale: Hurts a little bit Pain Location: back Pain Descriptors / Indicators: Sore Pain Intervention(s): Limited activity within patient's tolerance;Monitored during session;Premedicated before session;Repositioned     Hand Dominance     Extremity/Trunk Assessment Upper Extremity Assessment Upper Extremity Assessment: Overall WFL for tasks assessed           Communication Communication Communication: No difficulties   Cognition Arousal/Alertness: Awake/alert Behavior During Therapy: WFL for tasks assessed/performed Overall Cognitive Status: Within Functional Limits for tasks assessed                                     General Comments       Exercises     Shoulder Instructions      Home Living Family/patient expects to be discharged to:: Private residence Living Arrangements: Spouse/significant other;Children Available Help at Discharge: Family Type of Home: House             Bathroom Shower/Tub: Walk-in shower   Bathroom Toilet: Handicapped height     Home Equipment: Tub bench;Hand held shower head;Grab bars - tub/shower;Walker - 2  wheels;Bedside commode;Cane - single point          Prior Functioning/Environment Level of Independence: Independent(extra time)                 OT Problem List:        OT Treatment/Interventions:      OT Goals(Current goals can be found in the care plan section) Acute Rehab OT Goals Patient Stated Goal: good recovery. 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 OT Goal Formulation: All assessment and education complete, DC therapy  OT Frequency:     Barriers to D/C:            Co-evaluation              AM-PAC PT "6 Clicks" Daily Activity     Outcome  Measure Help from another person eating meals?: None Help from another person taking care of personal grooming?: A Little Help from another person toileting, which includes using toliet, bedpan, or urinal?: A Little Help from another person bathing (including washing, rinsing, drying)?: A Little Help from another person to put on and taking off regular upper body clothing?: A Little Help from another person to put on and taking off regular lower body clothing?: A Lot 6 Click Score: 18   End of Session    Activity Tolerance: Patient tolerated treatment well Patient left: in chair;with call bell/phone within reach;with family/visitor present  OT Visit Diagnosis: Muscle weakness (generalized) (M62.81)                Time: 8757-9728 OT Time Calculation (min): 43 min Charges:  OT General Charges $OT Visit: 1 Visit OT Evaluation $OT Eval Low Complexity: 1 Low OT Treatments $Self Care/Home Management : 8-22 mins $Therapeutic Activity: 8-22 mins G-Codes:     [place], OTR/L [phone] 12/30/2017  Merline Perkin 12/30/2017, 9:28 AM

## 2017-12-30 NOTE — Care Management Note (Signed)
Case Management Note  Patient Details  Name: Mason Scott MRN: [id number] Date of Birth: 1946/02/06  Subjective/Objective:  No CM needs.                  Action/Plan:d/c home.   Expected Discharge Date:  12/30/17               Expected Discharge Plan:  Home/Self Care  In-House Referral:     Discharge planning Services  CM Consult  Post Acute Care Choice:    Choice offered to:     DME Arranged:    DME Agency:     HH Arranged:    HH Agency:     Status of Service:  Completed, signed off  If discussed at [employer] of Stay Meetings, dates discussed:    Additional Comments:  Dessa Phi, RN 12/30/2017, 11:11 AM

## 2018-12-21 ENCOUNTER — Other Ambulatory Visit: Payer: Self-pay | Admitting: Internal Medicine

## 2018-12-21 ENCOUNTER — Other Ambulatory Visit: Payer: Medicare Other

## 2018-12-21 DIAGNOSIS — Z20822 Contact with and (suspected) exposure to covid-19: Secondary | ICD-10-CM

## 2018-12-22 LAB — NOVEL CORONAVIRUS, NAA: SARS-CoV-2, NAA: NOT DETECTED

## 2018-12-23 ENCOUNTER — Telehealth: Payer: Self-pay | Admitting: Family Medicine

## 2018-12-23 NOTE — Telephone Encounter (Signed)
Pt called in and gave pt Negative covid results, Expressed understanding  °

## 2019-06-12 ENCOUNTER — Other Ambulatory Visit: Payer: Self-pay

## 2019-06-12 ENCOUNTER — Ambulatory Visit (INDEPENDENT_AMBULATORY_CARE_PROVIDER_SITE_OTHER): Payer: Medicare Other | Admitting: Otolaryngology

## 2019-07-29 ENCOUNTER — Ambulatory Visit: Payer: Medicare Other

## 2020-02-08 ENCOUNTER — Other Ambulatory Visit: Payer: Medicare Other

## 2020-02-12 ENCOUNTER — Telehealth: Payer: Self-pay | Admitting: Nurse Practitioner

## 2020-02-12 ENCOUNTER — Other Ambulatory Visit (HOSPITAL_COMMUNITY): Payer: Self-pay | Admitting: Oncology

## 2020-02-12 ENCOUNTER — Encounter: Payer: Self-pay | Admitting: Oncology

## 2020-02-12 NOTE — Telephone Encounter (Signed)
Called to discuss with Mason Scott about Covid symptoms and the use of casirivimab/imdevimab, a combination monoclonal antibody infusion for those with mild to moderate Covid symptoms and at a high risk of hospitalization.     Pt is qualified for this infusion at the [facility] infusion center due to co-morbid conditions as indicated below. Will need to speak with patient further to determine/confirm eligibility based on symptom onset.   Unable to reach. Voicemail left and Mychart message sent.    Patient Active Problem List   Diagnosis Date Noted  . Spinal stenosis, lumbar region with neurogenic claudication 12/29/2017  . OSA (obstructive sleep apnea) 01/16/2017  . Hypertension   . Hyperlipidemia   . Chronic kidney disease   . Incisional hernia s/p lap repair with mesh 01/15/2017 01/15/2017  . Renal neoplasm 02/21/2015  . Prostate cancer ([place]) 07/21/2012    Alda Lea, AGPCNP-BC

## 2020-02-12 NOTE — Progress Notes (Signed)
I connected by phone with  Mason Scott on 02/12/2020 at 06:30pm to discuss the potential use of an new treatment for mild to moderate COVID-19 viral infection in non-hospitalized patients.   This patient is a age/sex that meets the FDA criteria for Emergency Use Authorization of casirivimab\imdevimab.  Has a (+) direct SARS-CoV-2 viral test result 1. Has mild or moderate COVID-19  2. Is ? 73 years of age and weighs ? 40 kg 3. Is NOT hospitalized due to COVID-19 4. Is NOT requiring oxygen therapy or requiring an increase in baseline oxygen flow rate due to COVID-19 5. Is within 10 days of symptom onset 6. Has at least one of the high risk factor(s) for progression to severe COVID-19 and/or hospitalization as defined in EUA. ? Specific high risk criteria  Age   Symptom onset  02/06/2020.    I have spoken and communicated the following to the patient or parent/caregiver:   1. FDA has authorized the emergency use of casirivimab\imdevimab for the treatment of mild to moderate COVID-19 in adults and pediatric patients with positive results of direct SARS-CoV-2 viral testing who are 72 years of age and older weighing at least 40 kg, and who are at high risk for progressing to severe COVID-19 and/or hospitalization.   2. The significant known and potential risks and benefits of casirivimab\imdevimab, and the extent to which such potential risks and benefits are unknown.   3. Information on available alternative treatments and the risks and benefits of those alternatives, including clinical trials.   4. Patients treated with casirivimab\imdevimab should continue to self-isolate and use infection control measures (e.g., wear mask, isolate, social distance, avoid sharing personal items, clean and disinfect "high touch" surfaces, and frequent handwashing) according to CDC guidelines.    5. The patient or parent/caregiver has the option to accept or refuse casirivimab\imdevimab .   After reviewing this  information with the patient, The patient agreed to proceed with receiving casirivimab\imdevimab infusion and will be provided a copy of the Fact sheet prior to receiving the infusion.Rulon Abide, AGNP-C [phone] ([place])

## 2020-02-13 ENCOUNTER — Ambulatory Visit (HOSPITAL_COMMUNITY)
Admission: RE | Admit: 2020-02-13 | Discharge: 2020-02-13 | Disposition: A | Discharge status: Home / Self Care | Payer: Medicare Other | Source: Ambulatory Visit | Attending: Pulmonary Disease | Admitting: Pulmonary Disease

## 2020-02-13 ENCOUNTER — Other Ambulatory Visit (HOSPITAL_COMMUNITY): Payer: Self-pay | Admitting: Oncology

## 2020-02-13 DIAGNOSIS — Z23 Encounter for immunization: Secondary | ICD-10-CM | POA: Diagnosis not present

## 2020-02-13 DIAGNOSIS — U071 COVID-19: Secondary | ICD-10-CM | POA: Diagnosis present

## 2020-02-13 MED ORDER — EPINEPHRINE 0.3 MG/0.3ML IJ SOAJ: 0.3000 mg | Freq: Once | INTRAMUSCULAR | Status: DC | PRN

## 2020-02-13 MED ORDER — ALBUTEROL SULFATE HFA 108 (90 BASE) MCG/ACT IN AERS: 2.0000 | INHALATION_SPRAY | Freq: Once | RESPIRATORY_TRACT | Status: DC | PRN

## 2020-02-13 MED ORDER — FAMOTIDINE IN NACL 20-0.9 MG/50ML-% IV SOLN: 20.0000 mg | Freq: Once | INTRAVENOUS | Status: DC | PRN

## 2020-02-13 MED ORDER — METHYLPREDNISOLONE SODIUM SUCC 125 MG IJ SOLR: 125.0000 mg | Freq: Once | INTRAMUSCULAR | Status: DC | PRN

## 2020-02-13 MED ORDER — SODIUM CHLORIDE 0.9 % IV SOLN: INTRAVENOUS | Status: DC | PRN

## 2020-02-13 MED ORDER — DIPHENHYDRAMINE HCL 50 MG/ML IJ SOLN: 50.0000 mg | Freq: Once | INTRAMUSCULAR | Status: DC | PRN

## 2020-02-13 MED ORDER — SODIUM CHLORIDE 0.9 % IV SOLN
1200.0000 mg | Freq: Once | INTRAVENOUS | Status: AC
  Administered 2020-02-13: 1200 mg via INTRAVENOUS
  Filled 2020-02-13: qty 600
  Filled 2020-02-13: qty 10

## 2020-02-13 NOTE — Progress Notes (Signed)
  Diagnosis: COVID-19  Physician: dr. Asencion Noble  Procedure: Covid Infusion Clinic Med: casirivimab\imdevimab infusion - Provided patient with casirivimab\imdevimab fact sheet for patients, parents and caregivers prior to infusion.  Complications: No immediate complications noted.  Discharge: Discharged home   Acquanetta Chain 02/13/2020

## 2020-02-13 NOTE — Discharge Instructions (Signed)

## 2020-02-19 ENCOUNTER — Ambulatory Visit (HOSPITAL_COMMUNITY)
Admission: RE | Admit: 2020-02-19 | Discharge: 2020-02-19 | Disposition: A | Discharge status: Home / Self Care | Payer: Medicare Other | Source: Ambulatory Visit | Attending: Internal Medicine | Admitting: Internal Medicine

## 2020-02-19 ENCOUNTER — Other Ambulatory Visit: Payer: Self-pay

## 2020-02-19 ENCOUNTER — Other Ambulatory Visit (HOSPITAL_COMMUNITY): Payer: Self-pay | Admitting: Internal Medicine

## 2020-02-19 DIAGNOSIS — R05 Cough: Secondary | ICD-10-CM | POA: Insufficient documentation

## 2020-02-19 DIAGNOSIS — R059 Cough, unspecified: Secondary | ICD-10-CM

## 2020-02-22 ENCOUNTER — Other Ambulatory Visit: Payer: Self-pay

## 2020-02-22 ENCOUNTER — Other Ambulatory Visit: Payer: Medicare Other

## 2020-02-22 DIAGNOSIS — Z20822 Contact with and (suspected) exposure to covid-19: Secondary | ICD-10-CM

## 2020-02-23 LAB — NOVEL CORONAVIRUS, NAA: SARS-CoV-2, NAA: NOT DETECTED

## 2020-02-23 LAB — SARS-COV-2, NAA 2 DAY TAT

## 2020-06-06 ENCOUNTER — Ambulatory Visit: Payer: Medicare Other | Attending: Internal Medicine

## 2020-06-06 DIAGNOSIS — Z23 Encounter for immunization: Secondary | ICD-10-CM

## 2020-06-06 NOTE — Progress Notes (Signed)
   Covid-19 Vaccination Clinic  Name:  Mason Scott    MRN: [id number] DOB: 05/02/1946  06/06/2020  Mason Scott was observed post Covid-19 immunization for 15 minutes without incident. He was provided with Vaccine Information Sheet and instruction to access the V-Safe system.   Mason Scott was instructed to call 911 with any severe reactions post vaccine: Marland Kitchen Difficulty breathing  . Swelling of face and throat  . A fast heartbeat  . A bad rash all over body  . Dizziness and weakness   Immunizations Administered    No immunizations on file.

## 2021-10-10 ENCOUNTER — Encounter: Payer: Self-pay | Admitting: Emergency Medicine

## 2021-10-10 ENCOUNTER — Ambulatory Visit
Admission: EM | Admit: 2021-10-10 | Discharge: 2021-10-10 | Disposition: A | Discharge status: Home / Self Care | Payer: Medicare Other | Attending: Family Medicine | Admitting: Family Medicine

## 2021-10-10 DIAGNOSIS — J069 Acute upper respiratory infection, unspecified: Secondary | ICD-10-CM

## 2021-10-10 MED ORDER — FLUTICASONE PROPIONATE 50 MCG/ACT NA SUSP: 1.0000 | Freq: Two times a day (BID) | NASAL | 0 refills | Status: AC

## 2021-10-10 MED ORDER — PREDNISONE 20 MG PO TABS: 40.0000 mg | ORAL_TABLET | Freq: Every day | ORAL | 0 refills | Status: AC

## 2021-10-10 MED ORDER — LIDOCAINE VISCOUS HCL 2 % MT SOLN: 10.0000 mL | OROMUCOSAL | 0 refills | Status: AC | PRN

## 2021-10-10 NOTE — ED Triage Notes (Signed)
Sore throat x several days.  States had a cough and the cough went away, but sore throat remains. ?

## 2021-10-10 NOTE — ED Provider Notes (Signed)
?[place] [place] ? ? ? ?CSN: [id number] ?Arrival date & time: 10/10/21  0801 ? ? ?  ? ?History   ?Chief Complaint ?No chief complaint on file. ? ? ?HPI ?Mason Scott is a 76 y.o. male.  ? ?Presenting today with almost a week of initially cough, sore throat, drainage but now mainly the sore throat and postnasal drip.  Denies fever, chills, body aches, chest pain, shortness of breath, abdominal pain, nausea vomiting or diarrhea.  So far taking Mucinex with mild relief.  No known history of seasonal allergies, chronic pulmonary disease.  Wife sick with similar symptoms. ? ? ?Past Medical History:  ?Diagnosis Date  ? Arthritis   ? knuckles  ? Bladder cancer ([place]) DX 2014  ? 37 BCG TX IN BLADDER  ? Cancer of kidney ([place]) 02/2015  ? Chronic kidney disease AUG 2017 RIGHT LEFT REMOVED NOV 2016  ? kidney mass right  ? Erectile dysfunction   ? Mild  ? GERD (gastroesophageal reflux disease)   ? NONE RECENT  ? Hyperlipidemia   ? Hypertension   ? OSA on CPAP   ? cpap- setting at 9   ? [place] AGE 32  ? RIGHT FOOT DROP  ? Prostate cancer ([place]) 05/23/12  ? history prior seed implant.   ? ? ?Patient Active Problem List  ? Diagnosis Date Noted  ? Spinal stenosis, lumbar region with neurogenic claudication 12/29/2017  ? OSA (obstructive sleep apnea) 01/16/2017  ? Hypertension   ? Hyperlipidemia   ? Chronic kidney disease   ? Incisional hernia s/p lap repair with mesh 01/15/2017 01/15/2017  ? Renal neoplasm 02/21/2015  ? Prostate cancer ([place]) 07/21/2012  ? ? ?Past Surgical History:  ?Procedure Laterality Date  ? BASAL CELL CARCINOMA EXCISION  02/27/14  ? Removed and Biopsed from back x 2, rt ear, Lt hand   ? CYSTOSCOPY N/A 09/29/2012  ? Procedure: CYSTOSCOPY FLEXIBLE;  Surgeon: Dutch Gray, MD;  Location: [facility];  Service: Urology;  Laterality: N/A;  no seeds found in bladder  ? CYSTOSCOPY N/A 02/21/2015  ? Procedure: CYSTOSCOPY FLEXIBLE;  Surgeon: Raynelle Bring, MD;  Location: [facility];  Service: Urology;   Laterality: N/A;  ? CYSTOSCOPY N/A 05/27/2015  ? Procedure: CYSTOSCOPY FLEXIBLE;  Surgeon: Raynelle Bring, MD;  Location: [facility];  Service: Urology;  Laterality: N/A;  ? CYSTOSCOPY W/ RETROGRADES N/A 05/25/2016  ? Procedure: CYSTOSCOPY WITH RETROGRADE PYELOGRAM, BLADDER BIOPSIES;  Surgeon: Raynelle Bring, MD;  Location: [facility];  Service: Urology;  Laterality: N/A;  ? CYSTOSCOPY WITH BIOPSY N/A 10/23/2013  ? Procedure: CYSTOSCOPY WITH BIOPSY, bilateral retrograde, transurethral resection of  bladder tumor ;  Surgeon: Dutch Gray, MD;  Location: [facility];  Service: Urology;  Laterality: N/A;  ? CYSTOSCOPY WITH BIOPSY N/A 03/08/2014  ? Procedure: CYSTOSCOPY WITH BIOPSY;  Surgeon: Raynelle Bring, MD;  Location: [facility];  Service: Urology;  Laterality: N/A;  ? CYSTOSCOPY WITH BIOPSY N/A 11/12/2014  ? Procedure: CYSTOSCOPY WITH BLADDER BIOPSIES;  Surgeon: Raynelle Bring, MD;  Location: [facility];  Service: Urology;  Laterality: N/A;  ? CYSTOSCOPY WITH BIOPSY Bilateral 01/16/2016  ? Procedure: CYSTOSCOPY WITH BLADDER BIOPSY,  BILATERAL RETROGRADE PYELOGRAM;  Surgeon: Raynelle Bring, MD;  Location: [facility];  Service: Urology;  Laterality: Bilateral;  ? GANGLION CYST EXCISION N/A 06/08/2001  ? AND 10-19-2001  RE-EXCISION RECURRENT CYST   ? INSERTION OF MESH N/A 01/15/2017  ? Procedure: INSERTION OF MESH;  Surgeon: Michael Boston, MD;  Location: [facility];  Service:  General;  Laterality: N/A;  ? LUMBAR LAMINECTOMY/DECOMPRESSION MICRODISCECTOMY N/A 12/29/2017  ? Procedure: Central decompresssion lumbar laminectomy L4-5 for spinal stenosis; excision of synovial cyst L4-5 on left, Foraminotomy for L4 and L5 nerve root on left;  Surgeon: Latanya Maudlin, MD;  Location: [facility];  Service: Orthopedics;  Laterality: N/A;  ? PILONIDAL CYST EXCISION  2004  ? PROSTATE BIOPSY  05/23/12  ? RADIOACTIVE SEED IMPLANT N/A 09/29/2012  ? Procedure: RADIOACTIVE SEED IMPLANT;  Surgeon: Dutch Gray, MD;  Location: [facility];  Service: Urology;  Laterality:  N/A;  66    seeds implanted  ? ROBOTIC ASSITED PARTIAL NEPHRECTOMY Right 02/21/2015  ? Procedure: ROBOTIC ASSITED PARTIAL NEPHRECTOMY;  Surgeon: Raynelle Bring, MD;  Location: [facility];  Service: Urology;  Laterality: Right;  ? ROBOTIC ASSITED PARTIAL NEPHRECTOMY Left 05/27/2015  ? Procedure: ROBOTIC ASSISTED PARTIAL NEPHRECTOMY;  Surgeon: Raynelle Bring, MD;  Location: [facility];  Service: Urology;  Laterality: Left;  ? TRANSURETHRAL RESECTION OF BLADDER TUMOR  03/1990  ? VENTRAL HERNIA REPAIR N/A 01/15/2017  ? Procedure: LAPAROSCOPIC VENTRAL HERNIA REPAIR WITH MESH;  Surgeon: Michael Boston, MD;  Location: [facility];  Service: General;  Laterality: N/A;  ERAS PATHWAY ?BILATERAL TAP BLOCK  ? ? ? ? ? ?Home Medications   ? ?Prior to Admission medications   ?Medication Sig Start Date End Date Taking? Authorizing Provider  ?fluticasone (FLONASE) 50 MCG/ACT nasal spray Place 1 spray into both nostrils 2 (two) times daily. 10/10/21  Yes Volney American, PA-C  ?lidocaine (XYLOCAINE) 2 % solution Use as directed 10 mLs in the mouth or throat every 3 (three) hours as needed for mouth pain. 10/10/21  Yes Volney American, PA-C  ?predniSONE (DELTASONE) 20 MG tablet Take 2 tablets (40 mg total) by mouth daily with breakfast. 10/10/21  Yes Volney American, PA-C  ?amLODipine (NORVASC) 10 MG tablet Take 10 mg by mouth every evening.     [provider]  ?aspirin EC 81 MG tablet Take 81 mg by mouth daily.    [provider]  ?HYDROcodone-acetaminophen (NORCO/VICODIN) 5-325 MG tablet Take 1 tablet by mouth every 4 (four) hours as needed for moderate pain ((score 4 to 6)). 12/30/17   Porterfield, Museum/gallery conservator, PA-C  ?meloxicam (MOBIC) 15 MG tablet Take 7.5 mg by mouth at bedtime.     [provider]  ?methocarbamol (ROBAXIN) 500 MG tablet Take 1 tablet (500 mg total) by mouth every 6 (six) hours as needed for muscle spasms. 12/30/17   P[place]field, Museum/gallery conservator, PA-C  ?Omega-3 Fatty Acids (FISH OIL OMEGA-3) 1000 MG CAPS  Take 1,000 mg by mouth every evening.     [provider]  ?simvastatin (ZOCOR) 10 MG tablet Take 5 mg by mouth every evening.     [provider]  ?valsartan-hydrochlorothiazide (DIOVAN-HCT) 160-25 MG per tablet Take 0.5 tablets by mouth every morning.     [provider]  ? ? ?Family History ?Family History  ?Problem Relation Age of Onset  ? Stroke Father   ? Congenital heart disease Mother   ?     Killed MVA  ? ? ?Social History ?Social History  ? ?Tobacco Use  ? Smoking status: Never  ? Smokeless tobacco: Never  ?Vaping Use  ? Vaping Use: Never used  ?Substance Use Topics  ? Alcohol use: Never  ? Drug use: Never  ? ? ? ?Allergies   ?Patient has no known allergies. ? ? ?Review of Systems ?Review of Systems ?Per HPI ? ?Physical  Exam ?Triage Vital Signs ?ED Triage Vitals  ?Enc Vitals Group  ?   BP 10/10/21 0812 (!) 159/81  ?   Pulse Rate 10/10/21 0812 (!) 59  ?   Resp 10/10/21 0812 18  ?   Temp 10/10/21 0812 98.1 ?F (36.7 ?C)  ?   Temp Source 10/10/21 0812 Oral  ?   SpO2 10/10/21 0812 96 %  ?   Weight --   ?   Height --   ?   Head Circumference --   ?   Peak Flow --   ?   Pain Score 10/10/21 0813 5  ?   Pain Loc --   ?   Pain Edu? --   ?   Excl. in [place]? --   ? ?No data found. ? ?Updated Vital Signs ?BP (!) 159/81 (BP Location: Right Arm)   Pulse (!) 59   Temp 98.1 ?F (36.7 ?C) (Oral)   Resp 18   SpO2 96%  ? ?Visual Acuity ?Right Eye Distance:   ?Left Eye Distance:   ?Bilateral Distance:   ? ?Right Eye Near:   ?Left Eye Near:    ?Bilateral Near:    ? ?Physical Exam ?Vitals and nursing note reviewed.  ?Constitutional:   ?   Appearance: He is well-developed.  ?HENT:  ?   Head: Atraumatic.  ?   Right Ear: External ear normal.  ?   Left Ear: External ear normal.  ?   Nose: Nose normal.  ?   Mouth/Throat:  ?   Mouth: Mucous membranes are moist.  ?   Pharynx: Oropharynx is clear. Posterior oropharyngeal erythema present. No oropharyngeal exudate.  ?Eyes:  ?   Conjunctiva/sclera: Conjunctivae  normal.  ?   Pupils: Pupils are equal, round, and reactive to light.  ?Cardiovascular:  ?   Rate and Rhythm: Normal rate and regular rhythm.  ?Pulmonary:  ?   Effort: Pulmonary effort is normal. No respiratory di

## 2021-10-12 ENCOUNTER — Other Ambulatory Visit: Payer: Self-pay | Admitting: Family Medicine

## 2021-10-13 NOTE — Telephone Encounter (Signed)
Requested Prescriptions  ?Pending Prescriptions Disp Refills  ?? fluticasone (FLONASE) 50 MCG/ACT nasal spray [Pharmacy Med Name: FLUTICASONE PROP 50 MCG SPRAY] 16 mL   ?  Sig: PLACE 1 SPRAY INTO BOTH NOSTRILS 2 (TWO) TIMES DAILY  ?  ? There is no refill protocol information for this order  ?  ? ?

## 2022-04-06 ENCOUNTER — Other Ambulatory Visit: Payer: Self-pay | Admitting: Family Medicine

## 2022-04-07 NOTE — Telephone Encounter (Signed)
Urgent Care patient Requested Prescriptions  Pending Prescriptions Disp Refills  . fluticasone (FLONASE) 50 MCG/ACT nasal spray [Pharmacy Med Name: FLUTICASONE PROP 50 MCG SPRAY] 16 mL     Sig: PLACE 1 SPRAY INTO BOTH NOSTRILS 2 (TWO) TIMES DAILY     There is no refill protocol information for this order

## 2023-04-02 ENCOUNTER — Encounter (HOSPITAL_COMMUNITY): Payer: Self-pay

## 2023-04-02 ENCOUNTER — Emergency Department (HOSPITAL_COMMUNITY): Payer: Medicare Other

## 2023-04-02 ENCOUNTER — Emergency Department (HOSPITAL_COMMUNITY)
Admission: EM | Admit: 2023-04-02 | Discharge: 2023-04-02 | Disposition: A | Discharge status: Home / Self Care | Payer: Medicare Other | Attending: Emergency Medicine | Admitting: Emergency Medicine

## 2023-04-02 ENCOUNTER — Other Ambulatory Visit: Payer: Self-pay

## 2023-04-02 DIAGNOSIS — I129 Hypertensive chronic kidney disease with stage 1 through stage 4 chronic kidney disease, or unspecified chronic kidney disease: Secondary | ICD-10-CM | POA: Diagnosis not present

## 2023-04-02 DIAGNOSIS — N189 Chronic kidney disease, unspecified: Secondary | ICD-10-CM | POA: Diagnosis not present

## 2023-04-02 DIAGNOSIS — M5136 Other intervertebral disc degeneration, lumbar region: Secondary | ICD-10-CM | POA: Diagnosis not present

## 2023-04-02 DIAGNOSIS — M5126 Other intervertebral disc displacement, lumbar region: Secondary | ICD-10-CM | POA: Diagnosis not present

## 2023-04-02 DIAGNOSIS — Z859 Personal history of malignant neoplasm, unspecified: Secondary | ICD-10-CM | POA: Diagnosis not present

## 2023-04-02 DIAGNOSIS — Z79899 Other long term (current) drug therapy: Secondary | ICD-10-CM | POA: Insufficient documentation

## 2023-04-02 DIAGNOSIS — M545 Low back pain, unspecified: Secondary | ICD-10-CM | POA: Diagnosis present

## 2023-04-02 DIAGNOSIS — Z7982 Long term (current) use of aspirin: Secondary | ICD-10-CM | POA: Insufficient documentation

## 2023-04-02 MED ORDER — MELOXICAM 7.5 MG PO TABS: 7.5000 mg | ORAL_TABLET | Freq: Every day | ORAL | 0 refills | Status: AC

## 2023-04-02 MED ORDER — KETOROLAC TROMETHAMINE 30 MG/ML IJ SOLN
30.0000 mg | Freq: Once | INTRAMUSCULAR | Status: AC
  Administered 2023-04-02: 30 mg via INTRAMUSCULAR
  Filled 2023-04-02: qty 1

## 2023-04-02 NOTE — Discharge Instructions (Addendum)
Thank you for allowing [place] to be a part of your care today.  Your CT scan did not show any fractures in your spine but did show significant degenerative changes and herniated discs which may explain your symptoms.  I have pasted your CT results below.  Disc levels:    L1-L2: There is bulging of the disc annulus with degenerative  endplate changes and facet arthropathy the spinal canal and neural  foramina are patent.    L2-L3: There is a circumferential disc bulge with facet arthropathy  and ligamentum flavum thickening resulting in mild spinal canal. No  significant neural foraminal stenosis is seen.    L3-L4: There is a disc herniation with a left paracentral and neural  foraminal component with degenerative endplate changes, facet  arthropathy and ligamentum flaking thickening resulting in mild  spinal canal stenosis there is mild neural foraminal on the right  and moderate neural foraminal on the left.    L4-L5: There is a diffuse disc herniation with degenerative endplate  changes and facet arthropathy. Laminectomy changes are noted at L4.  There suggestion of moderate-to-severe residual spinal canal  stenosis. Mild neural foraminal stenosis is present bilaterally.    L5-S1: There is mild disc herniation with degenerative endplate  changes and facet arthropathy. No significant spinal canal and right  neural foraminal stenosis. There is moderate neural foraminal  stenosis on the left.   You received a Toradol shot while in the ED to help with your symptoms.  I am sending you home on a prescription strength anti-inflammatory to help with symptoms.  Take as prescribed starting TOMORROW.  I recommend following up with your orthopedic provider as soon as possible.  You may go to the orthopedic urgent care tomorrow.    Return to the ED if you develop sudden worsening of your symptoms, have loss of bladder or bowel control, are unable to walk, or if you have any new concerns.

## 2023-04-02 NOTE — ED Triage Notes (Signed)
Pt reports with right lower stabbing back pain x 1 month off and on. Pt states that last night the pain came back. 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

## 2023-04-02 NOTE — ED Provider Notes (Signed)
[place] EMERGENCY DEPARTMENT AT [facility] Provider Note   CSN: [id number] Arrival date & time: 04/02/23  2059     History  Chief Complaint  Patient presents with   Back Pain    Mason Scott is a 77 y.o. male with past medical history significant for hyperlipidemia, hypertension, arthritis, CKD, GERD, cancer presents to the ED complaining of right lower stabbing back pain on and off for the past month.  Patient states the pain returned last night.  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  He has had prior back surgery and is followed by orthopedics.  Denies numbness, tingling, weakness, loss of bladder or bowel control, difficulty walking, urinary symptoms.         Home Medications Prior to Admission medications   Medication Sig Start Date End Date Taking? Authorizing Provider  meloxicam (MOBIC) 7.5 MG tablet Take 1 tablet (7.5 mg total) by mouth daily. 04/02/23  Yes Veona Bittman R, PA-C  amLODipine (NORVASC) 10 MG tablet Take 10 mg by mouth every evening.     [provider]  aspirin EC 81 MG tablet Take 81 mg by mouth daily.    [provider]  fluticasone (FLONASE) 50 MCG/ACT nasal spray Place 1 spray into both nostrils 2 (two) times daily. 10/10/21   Particia Nearing, PA-C  HYDROcodone-acetaminophen (NORCO/VICODIN) 5-325 MG tablet Take 1 tablet by mouth every 4 (four) hours as needed for moderate pain ((score 4 to 6)). 12/30/17   Porterfield, Amber, PA-C  lidocaine (XYLOCAINE) 2 % solution Use as directed 10 mLs in the mouth or throat every 3 (three) hours as needed for mouth pain. 10/10/21   Particia Nearing, PA-C  methocarbamol (ROBAXIN) 500 MG tablet Take 1 tablet (500 mg total) by mouth every 6 (six) hours as needed for muscle spasms. 12/30/17   Porterfield, Hospital doctor, PA-C  Omega-3 Fatty Acids (FISH OIL OMEGA-3) 1000 MG CAPS Take 1,000 mg by mouth every evening.     [provider]  predniSONE (DELTASONE) 20  MG tablet Take 2 tablets (40 mg total) by mouth daily with breakfast. 10/10/21   Particia Nearing, PA-C  simvastatin (ZOCOR) 10 MG tablet Take 5 mg by mouth every evening.     [provider]  valsartan-hydrochlorothiazide (DIOVAN-HCT) 160-25 MG per tablet Take 0.5 tablets by mouth every morning.     [provider]      Allergies    Patient has no known allergies.    Review of Systems   Review of Systems  Genitourinary:  Negative for dysuria and hematuria.  Musculoskeletal:  Positive for back pain. Negative for gait problem and neck pain.  Neurological:  Negative for weakness and numbness.    Physical Exam Updated Vital Signs BP (!) 154/92 (BP Location: Right Arm)   Pulse 71   Temp 97.9 F (36.6 C) (Oral)   Resp 16   Ht 5\' 10"  (1.778 m)   Wt 99.8 kg   SpO2 99%   BMI 31.57 kg/m  Physical Exam Vitals and nursing note reviewed.  Constitutional:      General: He is not in acute distress.    Appearance: Normal appearance. He is not ill-appearing or diaphoretic.  Cardiovascular:     Rate and Rhythm: Normal rate and regular rhythm.  Pulmonary:     Effort: Pulmonary effort is normal.  Musculoskeletal:     Lumbar back: Tenderness (right paraspinal) present. No spasms or bony tenderness. Normal range  of motion. Negative right straight leg raise test and negative left straight leg raise test.     Comments: Increased pain with flexion and extension of lumbar spine.  Good ROM.  Gait intact.    Skin:    General: Skin is warm and dry.     Capillary Refill: Capillary refill takes less than 2 seconds.  Neurological:     Mental Status: He is alert. Mental status is at baseline.  Psychiatric:        Mood and Affect: Mood normal.        Behavior: Behavior normal.     ED Results / Procedures / Treatments   Labs (all labs ordered are listed, but only abnormal results are displayed) Labs Reviewed - No data to display  EKG None  Radiology CT Lumbar Spine Wo  Contrast  Result Date: 04/02/2023 CLINICAL DATA:  Low back pain, increased fracture risk. EXAM: CT LUMBAR SPINE WITHOUT CONTRAST TECHNIQUE: Multidetector CT imaging of the lumbar spine was performed without intravenous contrast administration. Multiplanar CT image reconstructions were also generated. RADIATION DOSE REDUCTION: This exam was performed according to the departmental dose-optimization program which includes automated exposure control, adjustment of the mA and/or kV according to patient size and/or use of iterative reconstruction technique. COMPARISON:  11/09/2017. FINDINGS: Segmentation: 5 lumbar type vertebrae. Alignment: There is mild retrolisthesis at L2-L3 and L3-L4. Mild anterolisthesis at L4-L5. Vertebrae: No acute fracture or focal pathologic process. Paraspinal and other soft tissues: No acute abnormality. Aortic atherosclerosis is noted. Scattered diverticula are present along the colon without evidence of diverticulitis. Hyperdense lesions are noted in the right kidney, likely hemorrhagic or proteinaceous cysts. Surgical changes are noted in the mid left kidney. Disc levels: L1-L2: There is bulging of the disc annulus with degenerative endplate changes and facet arthropathy the spinal canal and neural foramina are patent. L2-L3: There is a circumferential disc bulge with facet arthropathy and ligamentum flavum thickening resulting in mild spinal canal. No significant neural foraminal stenosis is seen. L3-L4: There is a disc herniation with a left paracentral and neural foraminal component with degenerative endplate changes, facet arthropathy and ligamentum flaking thickening resulting in mild spinal canal stenosis there is mild neural foraminal on the right and moderate neural foraminal on the left. L4-L5: There is a diffuse disc herniation with degenerative endplate changes and facet arthropathy. Laminectomy changes are noted at L4. There suggestion of moderate-to-severe residual spinal  canal stenosis. Mild neural foraminal stenosis is present bilaterally. L5-S1: There is mild disc herniation with degenerative endplate changes and facet arthropathy. No significant spinal canal and right neural foraminal stenosis. There is moderate neural foraminal stenosis on the left. IMPRESSION: 1. No acute fracture. 2. Multilevel degenerative disc disease and facet arthropathy resulting in moderate-to-severe spinal canal stenosis at L4-L5. Remaining findings detailed above. Electronically Signed   By: Thornell Sartorius M.D.   On: 04/02/2023 22:57    Procedures Procedures    Medications Ordered in ED Medications  ketorolac (TORADOL) 30 MG/ML injection 30 mg (30 mg Intramuscular Given 04/02/23 2345)    ED Course/ Medical Decision Making/ A&P                                 Medical Decision Making  This patient presents to the ED with chief complaint(s) of lower back pain with pertinent past medical history of prior back surgery, prostate, kidney and bladder cancers.  The complaint involves an extensive differential  diagnosis and also carries with it a high risk of complications and morbidity.    The differential diagnosis includes degenerative disc disease, herniated disc, lumbar strain, sciatica    The initial plan is to obtain CT lumbar spine due to increased risk factors  Initial Assessment:   Exam significant for overall well-appearing patient who is not in acute distress.  He is moving lower extremities without difficulty and gait is intact.  Negative SLR bilaterally.  Increased pain with flexion and extension, however, ROM is good.  No midline tenderness to palpation.  Tenderness to right paraspinal lumbar region.    Independent visualization and interpretation of imaging: I independently visualized the following imaging with scope of interpretation limited to determining acute life threatening conditions related to emergency care: CT lumbar spine, which revealed degenerative changes  and multiple herniated and bulging discs.  There is also spinal canal stenosis at L4-L5.     Treatment and Reassessment: Patient given IM Toradol for back pain.   Disposition:   Suspect patient's symptoms are related to degenerative disc disease, facet arthropathy, bulging discs and herniated discs.  Patient does perform heavy lifting and bending as part of his job.  Will send patient home on meloxicam.  Patient to follow up outpatient with orthopedics.  He reports he will likely go to ortho urgent care tomorrow.    The patient has been appropriately medically screened and/or stabilized in the ED. I have low suspicion for any other emergent medical condition which would require further screening, evaluation or treatment in the ED or require inpatient management. At time of discharge the patient is hemodynamically stable and in no acute distress. I have discussed work-up results and diagnosis with patient and answered all questions. Patient is agreeable with discharge plan. We discussed strict return precautions for returning to the emergency department and they verbalized understanding.            Final Clinical Impression(s) / ED Diagnoses Final diagnoses:  Acute right-sided low back pain without sciatica  Degenerative disc disease, lumbar  Lumbar herniated disc    Rx / DC Orders ED Discharge Orders          Ordered    meloxicam (MOBIC) 7.5 MG tablet  Daily        04/02/23 2339              Lenard Simmer, PA-C 04/03/23 0010    Arby Barrette, MD 04/05/23 [phone]

## 2023-04-02 NOTE — ED Provider Notes (Incomplete)
[place] EMERGENCY DEPARTMENT AT [facility] Provider Note   CSN: [id number] Arrival date & time: 04/02/23  2059     History {Add pertinent medical, surgical, social history, OB history to HPI:1} Chief Complaint  Patient presents with  . Back Pain    CHRISTAN DEFRANCO is a 77 y.o. male with past medical history significant for hyperlipidemia, hypertension, arthritis, CKD, GERD, cancer presents to the ED complaining of right lower stabbing back pain on and off for the past month.  Patient states the pain returned last night.  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  He has had prior back surgery and is followed by orthopedics.  Denies numbness, tingling, weakness, loss of bladder or bowel control, difficulty walking.         Home Medications Prior to Admission medications   Medication Sig Start Date End Date Taking? Authorizing Provider  amLODipine (NORVASC) 10 MG tablet Take 10 mg by mouth every evening.     [provider]  aspirin EC 81 MG tablet Take 81 mg by mouth daily.    [provider]  fluticasone (FLONASE) 50 MCG/ACT nasal spray Place 1 spray into both nostrils 2 (two) times daily. 10/10/21   Particia Nearing, PA-C  HYDROcodone-acetaminophen (NORCO/VICODIN) 5-325 MG tablet Take 1 tablet by mouth every 4 (four) hours as needed for moderate pain ((score 4 to 6)). 12/30/17   Porterfield, Amber, PA-C  lidocaine (XYLOCAINE) 2 % solution Use as directed 10 mLs in the mouth or throat every 3 (three) hours as needed for mouth pain. 10/10/21   Particia Nearing, PA-C  meloxicam (MOBIC) 15 MG tablet Take 7.5 mg by mouth at bedtime.     [provider]  methocarbamol (ROBAXIN) 500 MG tablet Take 1 tablet (500 mg total) by mouth every 6 (six) hours as needed for muscle spasms. 12/30/17   Porterfield, Hospital doctor, PA-C  Omega-3 Fatty Acids (FISH OIL OMEGA-3) 1000 MG CAPS Take 1,000 mg by mouth every evening.     [provider]  predniSONE (DELTASONE) 20 MG tablet Take 2 tablets (40 mg total) by mouth daily with breakfast. 10/10/21   Particia Nearing, PA-C  simvastatin (ZOCOR) 10 MG tablet Take 5 mg by mouth every evening.     [provider]  valsartan-hydrochlorothiazide (DIOVAN-HCT) 160-25 MG per tablet Take 0.5 tablets by mouth every morning.     [provider]      Allergies    Patient has no known allergies.    Review of Systems   Review of Systems  Musculoskeletal:  Positive for back pain.    Physical Exam Updated Vital Signs BP (!) 154/92 (BP Location: Right Arm)   Pulse 71   Temp 97.9 F (36.6 C) (Oral)   Resp 16   Ht 5\' 10"  (1.778 m)   Wt 99.8 kg   SpO2 99%   BMI 31.57 kg/m  Physical Exam  ED Results / Procedures / Treatments   Labs (all labs ordered are listed, but only abnormal results are displayed) Labs Reviewed - No data to display  EKG None  Radiology No results found.  Procedures Procedures  {Document cardiac monitor, telemetry assessment procedure when appropriate:1}  Medications Ordered in ED Medications - No data to display  ED Course/ Medical Decision Making/ A&P   {   Click here for ABCD2, HEART and other calculatorsREFRESH Note before signing :1}  Medical Decision Making  This patient presents to the ED with chief complaint(s) of *** with pertinent past medical history of ***.  The complaint involves an extensive differential diagnosis and also carries with it a high risk of complications and morbidity.    The differential diagnosis includes ***   The initial plan is to ***  Additional history obtained: Additional history obtained from {additional history:26846} Records reviewed {records:26847}  Initial Assessment:   Exam significant for overall well-appearing patient who is not in acute distress.  He is moving lower extremities without difficulty and gait is intact.  Negative SLR  bilaterally.  Increased pain with flexion and extension, however, ROM is good.  No midline tenderness to palpation.  Tenderness to right paraspinal   Independent ECG/labs interpretation:  The following labs were independently interpreted:  ***  Independent visualization and interpretation of imaging: I independently visualized the following imaging with scope of interpretation limited to determining acute life threatening conditions related to emergency care: CT lumbar spine, which revealed degenerative changes and multiple herniated and bulging discs.  There is also spinal canal stenosis at L4-L5.     Treatment and Reassessment: Patient given IM Toradol for back pain.   Disposition:   Suspect patient's symptoms are related to degenerative disc disease, facet arthropathy, bulging discs and herniated discs.  Patient does perform heavy lifting and bending as part of his job.  Will send patient home on meloxicam.  Patient to follow up outpatient with orthopedics.  He reports he will likely go to ortho urgent care tomorrow.    The patient has been appropriately medically screened and/or stabilized in the ED. I have low suspicion for any other emergent medical condition which would require further screening, evaluation or treatment in the ED or require inpatient management. At time of discharge the patient is hemodynamically stable and in no acute distress. I have discussed work-up results and diagnosis with patient and answered all questions. Patient is agreeable with discharge plan. We discussed strict return precautions for returning to the emergency department and they verbalized understanding.     Social Determinants of Health:   Patient's {UJWJ:19147}  increases the complexity of managing their presentation   {Document critical care time when appropriate:1} {Document review of labs and clinical decision tools ie heart score, Chads2Vasc2 etc:1}  {Document your independent review of radiology  images, and any outside records:1} {Document your discussion with family members, caretakers, and with consultants:1} {Document social determinants of health affecting pt's care:1} {Document your decision making why or why not admission, treatments were needed:1} Final Clinical Impression(s) / ED Diagnoses Final diagnoses:  None    Rx / DC Orders ED Discharge Orders     None

## 2023-10-25 ENCOUNTER — Ambulatory Visit: Payer: Medicare Other | Attending: Cardiovascular Disease | Admitting: Cardiovascular Disease

## 2023-10-25 ENCOUNTER — Encounter: Payer: Self-pay | Admitting: Cardiovascular Disease

## 2023-10-25 VITALS — BP 133/70 | HR 50 | Wt 220.0 lb

## 2023-10-25 DIAGNOSIS — R011 Cardiac murmur, unspecified: Secondary | ICD-10-CM

## 2023-10-25 DIAGNOSIS — I10 Essential (primary) hypertension: Secondary | ICD-10-CM | POA: Diagnosis not present

## 2023-10-25 DIAGNOSIS — E785 Hyperlipidemia, unspecified: Secondary | ICD-10-CM | POA: Diagnosis not present

## 2023-10-25 NOTE — Patient Instructions (Signed)
 Medication Instructions:  No changes *If you need a refill on your cardiac medications before your next appointment, please call your pharmacy*  Testing/Procedures: Your physician has requested that you have an echocardiogram. Echocardiography is a painless test that uses sound waves to create images of your heart. It provides your doctor with information about the size and shape of your heart and how well your heart's chambers and valves are working. This procedure takes approximately one hour. There are no restrictions for this procedure. Please do NOT wear cologne, perfume, aftershave, or lotions (deodorant is allowed). Please arrive 15 minutes prior to your appointment time.  Please note: We ask at that you not bring children with you during ultrasound (echo/ vascular) testing. Due to room size and safety concerns, children are not allowed in the ultrasound rooms during exams. Our front office staff cannot provide observation of children in our lobby area while testing is being conducted. An adult accompanying a patient to their appointment will only be allowed in the ultrasound room at the discretion of the ultrasound technician under special circumstances. We apologize for any inconvenience.   Follow-Up: At [facility], you and your health needs are our priority.  As part of our continuing mission to provide you with exceptional heart care, our providers are all part of one team.  This team includes your primary Cardiologist (physician) and Advanced Practice Providers or APPs (Physician Assistants and Nurse Practitioners) who all work together to provide you with the care you need, when you need it.  Your next appointment:    Follow up as needed  Provider:   Dr Alvis Ba  We recommend signing up for the patient portal called "MyChart".  Sign up information is provided on this After Visit Summary.  MyChart is used to connect with patients for Virtual Visits (Telemedicine).  Patients  are able to view lab/test results, encounter notes, upcoming appointments, etc.  Non-urgent messages can be sent to your provider as well.   To learn more about what you can do with MyChart, go to [email or web address].       1st Floor: - Lobby - Registration  - Pharmacy  - Lab - Cafe  2nd Floor: - PV Lab - Diagnostic Testing (echo, CT, nuclear med)  3rd Floor: - Vacant  4th Floor: - TCTS (cardiothoracic surgery) - AFib Clinic - Structural Heart Clinic - Vascular Surgery  - Vascular Ultrasound  5th Floor: - HeartCare Cardiology (general and EP) - Clinical Pharmacy for coumadin, hypertension, lipid, weight-loss medications, and med management appointments    Valet parking services will be available as well.

## 2023-10-25 NOTE — Progress Notes (Signed)
 Cardiology Office Note:    Date:  10/25/2023   ID:  Mason Scott, Mason Scott July 08, 1945, MRN [id number]  PCP:  Minus Amel, MD   [facility] Providers Cardiologist:  None     Referring MD: Roxene Cora, [place]*   Chief Complaint  Patient presents with   consult  Mason Scott is a 78 y.o. male who is being seen today for the evaluation of murmur at the request of Roxene Cora, [place]*.   History of Present Illness:    Mason Scott is a 78 y.o. male who presents with a newly detected heart murmur. He was referred by Leverne Reading, PA at [facility], for evaluation of a heart murmur.  His wife [name] is also my patient.  He was informed of a heart murmur during a visit in January for a sinus infection. He had not been previously told about a heart murmur during past visits to [facility].  No symptoms such as shortness of breath, chest pain, or syncope. His physical activity level is high, including driving a tour bus, loading luggage, and performing yard work. He mows the lawn with a ride mower and occasionally uses a weed killer instead of weeding. 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 Despite this, he states, 'I do everything I need to do.'  Past Medical History:  Diagnosis Date   Arthritis    knuckles   Bladder cancer (HCC) DX 2014   37 BCG TX IN BLADDER   Cancer of kidney (HCC) 02/2015   Chronic kidney disease AUG 2017 RIGHT LEFT REMOVED NOV 2016   kidney mass right   Erectile dysfunction    Mild   GERD (gastroesophageal reflux disease)    NONE RECENT   Hyperlipidemia    Hypertension    OSA on CPAP    cpap- setting at 9    [place] AGE 24   RIGHT FOOT DROP   Prostate cancer (HCC) 05/23/12   history prior seed implant.     Past Surgical  History:  Procedure Laterality Date   BASAL CELL CARCINOMA EXCISION  02/27/14   Removed and Biopsed from back x 2, rt ear, Lt hand    CYSTOSCOPY N/A 09/29/2012   Procedure: CYSTOSCOPY FLEXIBLE;  Surgeon: Kristeen Peto, MD;  [facility]: [facility];  Service: Urology;  Laterality: N/A;  no seeds found in bladder   CYSTOSCOPY N/A 02/21/2015   Procedure: CYSTOSCOPY FLEXIBLE;  Surgeon: Florencio Hunting, MD;  Location: [facility];  Service: Urology;  Laterality: N/A;   CYSTOSCOPY N/A 05/27/2015   Procedure: CYSTOSCOPY FLEXIBLE;  Surgeon: Florencio Hunting, MD;  Location: [facility];  Service: Urology;  Laterality: N/A;   CYSTOSCOPY W/ RETROGRADES N/A 05/25/2016   Procedure: CYSTOSCOPY WITH RETROGRADE PYELOGRAM, BLADDER BIOPSIES;  Surgeon: Florencio Hunting, MD;  Location: [facility];  Service: Urology;  Laterality: N/A;   CYSTOSCOPY WITH BIOPSY N/A 10/23/2013   Procedure: CYSTOSCOPY WITH BIOPSY, bilateral retrograde, transurethral resection of  bladder tumor ;  Surgeon: Kristeen Peto, MD;  Location: [facility];  Service: Urology;  Laterality: N/A;   CYSTOSCOPY WITH BIOPSY N/A 03/08/2014   Procedure: CYSTOSCOPY WITH BIOPSY;  Surgeon: Florencio Hunting, MD;  Location: [facility];  Service: Urology;  Laterality: N/A;   CYSTOSCOPY WITH BIOPSY N/A 11/12/2014  Procedure: CYSTOSCOPY WITH BLADDER BIOPSIES;  Surgeon: Florencio Hunting, MD;  Location: [facility];  Service: Urology;  Laterality: N/A;   CYSTOSCOPY WITH BIOPSY Bilateral 01/16/2016   Procedure: CYSTOSCOPY WITH BLADDER BIOPSY,  BILATERAL RETROGRADE PYELOGRAM;  Surgeon: Florencio Hunting, MD;  Location: [facility];  Service: Urology;  Laterality: Bilateral;   GANGLION CYST EXCISION N/A 06/08/2001   AND 10-19-2001  RE-EXCISION RECURRENT CYST    INSERTION OF MESH N/A 01/15/2017   Procedure: INSERTION OF MESH;  Surgeon: Candyce Champagne, MD;  Location: [facility];  Service: General;  Laterality: N/A;   LUMBAR LAMINECTOMY/DECOMPRESSION MICRODISCECTOMY N/A 12/29/2017   Procedure: Central decompresssion lumbar  laminectomy L4-5 for spinal stenosis; excision of synovial cyst L4-5 on left, Foraminotomy for L4 and L5 nerve root on left;  Surgeon: Hazle Lites, MD;  Location: [facility];  Service: Orthopedics;  Laterality: N/A;   PILONIDAL CYST EXCISION  2004   PROSTATE BIOPSY  05/23/12   RADIOACTIVE SEED IMPLANT N/A 09/29/2012   Procedure: RADIOACTIVE SEED IMPLANT;  Surgeon: Kristeen Peto, MD;  Location: [facility];  Service: Urology;  Laterality: N/A;  66    seeds implanted   ROBOTIC ASSITED PARTIAL NEPHRECTOMY Right 02/21/2015   Procedure: ROBOTIC ASSITED PARTIAL NEPHRECTOMY;  Surgeon: Florencio Hunting, MD;  Location: [facility];  Service: Urology;  Laterality: Right;   ROBOTIC ASSITED PARTIAL NEPHRECTOMY Left 05/27/2015   Procedure: ROBOTIC ASSISTED PARTIAL NEPHRECTOMY;  Surgeon: Florencio Hunting, MD;  Location: [facility];  Service: Urology;  Laterality: Left;   TRANSURETHRAL RESECTION OF BLADDER TUMOR  03/1990   VENTRAL HERNIA REPAIR N/A 01/15/2017   Procedure: LAPAROSCOPIC VENTRAL HERNIA REPAIR WITH MESH;  Surgeon: Candyce Champagne, MD;  Location: [facility];  Service: General;  Laterality: N/A;  ERAS PATHWAY BILATERAL TAP BLOCK    Current Medications: Current Meds  Medication Sig   amLODipine  (NORVASC ) 10 MG tablet Take 10 mg by mouth every evening.    ascorbic acid (VITAMIN C) 1000 MG tablet Take 1,000 mg by mouth daily.   aspirin  EC 81 MG tablet Take 81 mg by mouth daily.   Cholecalciferol (D 1000) 25 MCG (1000 UT) capsule Take 1,000 Units by mouth daily.   cyanocobalamin (VITAMIN B12) 1000 MCG tablet Take 1,000 mcg by mouth daily.   Omega-3 Fatty Acids (FISH OIL  OMEGA-3) 1000 MG CAPS Take 1,000 mg by mouth every evening.    simvastatin  (ZOCOR ) 10 MG tablet Take 5 mg by mouth every evening.    valsartan -hydrochlorothiazide  (DIOVAN -HCT) 160-25 MG per tablet Take 0.5 tablets by mouth every morning.      Allergies:   Patient has no known allergies.   Family History: The patient's family history  includes Congenital heart disease in his mother; Stroke in his father.  ROS:   Please see the history of present illness.     All other systems reviewed and are negative.  EKGs/Labs/Other Studies Reviewed:    The following studies were reviewed today:  EKG Interpretation Date/Time:  Monday October 25 2023 12:11:46 EDT Ventricular Rate:  50 PR Interval:  142 QRS Duration:  146 QT Interval:  490 QTC Calculation: 446 R Axis:   9  Text Interpretation: Sinus bradycardia with Premature atrial complexes Possible Left atrial enlargement Right bundle branch block When compared with ECG of 23-Dec-2017 11:03, Premature atrial complexes are now Present Nonspecific T wave abnormality has replaced inverted T waves in Inferior leads Confirmed by Naydeen Speirs [phone]) on 10/25/2023 12:14:53 PM    Recent Labs: No results found for requested labs  within last 365 days.  Recent Lipid Panel No results found for: "CHOL", "TRIG", "HDL", "CHOLHDL", "VLDL", "LDLCALC", "LDLDIRECT" 12/05/2022 cholesterol 159, HDL 40, LDL 92, triglycerides 153 31 2024 creatinine 1.2, potassium 4.3, ALT 17  Risk Assessment/Calculations:                Physical Exam:    VS:  BP 133/70   Pulse (!) 50   Wt 220 lb (99.8 kg)   SpO2 95%   BMI 31.57 kg/m     Wt Readings from Last 3 Encounters:  10/25/23 220 lb (99.8 kg)  04/02/23 220 lb (99.8 kg)  12/29/17 214 lb (97.1 kg)     GEN:  Well nourished, well developed in no acute distress HEENT: Normal NECK: No JVD; No carotid bruits LYMPHATICS: No lymphadenopathy CARDIAC: RRR, 1-2/6 early peaking aortic ejection murmur heard best at the right upper sternal border, 3/6 late systolic murmur heard at the apex radiating towards the axilla, no diastolic murmurs, rubs, gallops RESPIRATORY:  Clear to auscultation without rales, wheezing or rhonchi  ABDOMEN: Soft, non-tender, non-distended MUSCULOSKELETAL:  No edema; No deformity  SKIN: Warm and dry NEUROLOGIC:  Alert and  oriented x 3 PSYCHIATRIC:  Normal affect   ASSESSMENT:    1. Heart murmur    PLAN:    In order of problems listed above:  Heart murmur: I think he has 2 systolic murmurs 1 is probably due to aortic valve sclerosis and the other 1 has features suggestive of mitral valve prolapse with mitral insufficiency.  Will schedule for echocardiogram.  He is asymptomatic at rest and with activity.  Briefly discussed that sometimes we pursue evaluation of mitral murmurs in more detail with a transesophageal echo. HTN: Satisfactory blood pressure control.  Continue amlodipine /valsartan -hydrochlorothiazide .  Try to use the long-acting NSAID sparingly. HLP: All lipid parameters in acceptable range (Target LDL less than 100).           Medication Adjustments/Labs and Tests Ordered: Current medicines are reviewed at length with the patient today.  Concerns regarding medicines are outlined above.  Orders Placed This Encounter  Procedures   EKG 12-Lead   ECHOCARDIOGRAM COMPLETE   No orders of the defined types were placed in this encounter.   Patient Instructions  Medication Instructions:  No changes *If you need a refill on your cardiac medications before your next appointment, please call your pharmacy*  Testing/Procedures: Your physician has requested that you have an echocardiogram. Echocardiography is a painless test that uses sound waves to create images of your heart. It provides your doctor with information about the size and shape of your heart and how well your heart's chambers and valves are working. This procedure takes approximately one hour. There are no restrictions for this procedure. Please do NOT wear cologne, perfume, aftershave, or lotions (deodorant is allowed). Please arrive 15 minutes prior to your appointment time.  Please note: We ask at that you not bring children with you during ultrasound (echo/ vascular) testing. Due to room size and safety concerns, children are not  allowed in the ultrasound rooms during exams. Our front office staff cannot provide observation of children in our lobby area while testing is being conducted. An adult accompanying a patient to their appointment will only be allowed in the ultrasound room at the discretion of the ultrasound technician under special circumstances. We apologize for any inconvenience.   Follow-Up: At [facility], you and your health needs are our priority.  As part of our continuing mission  to provide you with exceptional heart care, our providers are all part of one team.  This team includes your primary Cardiologist (physician) and Advanced Practice Providers or APPs (Physician Assistants and Nurse Practitioners) who all work together to provide you with the care you need, when you need it.  Your next appointment:    Follow up as needed  Provider:   Dr Alvis Ba  We recommend signing up for the patient portal called "MyChart".  Sign up information is provided on this After Visit Summary.  MyChart is used to connect with patients for Virtual Visits (Telemedicine).  Patients are able to view lab/test results, encounter notes, upcoming appointments, etc.  Non-urgent messages can be sent to your provider as well.   To learn more about what you can do with MyChart, go to [email or web address].       1st Floor: - Lobby - Registration  - Pharmacy  - Lab - Cafe  2nd Floor: - PV Lab - Diagnostic Testing (echo, CT, nuclear med)  3rd Floor: - Vacant  4th Floor: - TCTS (cardiothoracic surgery) - AFib Clinic - Structural Heart Clinic - Vascular Surgery  - Vascular Ultrasound  5th Floor: - HeartCare Cardiology (general and EP) - Clinical Pharmacy for coumadin, hypertension, lipid, weight-loss medications, and med management appointments    Valet parking services will be available as well.      Signed, Luana Rumple, MD  10/25/2023 1:52 PM    [facility]

## 2023-11-15 ENCOUNTER — Encounter: Payer: Self-pay | Admitting: Cardiovascular Disease

## 2023-11-15 ENCOUNTER — Ambulatory Visit (HOSPITAL_COMMUNITY)
Admission: RE | Admit: 2023-11-15 | Discharge: 2023-11-15 | Disposition: A | Discharge status: Home / Self Care | Source: Ambulatory Visit | Attending: Cardiovascular Disease | Admitting: Cardiovascular Disease

## 2023-11-15 DIAGNOSIS — R011 Cardiac murmur, unspecified: Secondary | ICD-10-CM | POA: Insufficient documentation

## 2023-11-15 LAB — ECHOCARDIOGRAM COMPLETE
AR max vel: 1.97 cm2
AV Area VTI: 1.88 cm2
AV Area mean vel: 2.22 cm2
AV Mean grad: 11 mmHg
AV Peak grad: 20.6 mmHg
Ao pk vel: 2.27 m/s
Area-P 1/2: 2.8 cm2
P 1/2 time: 742 ms
S' Lateral: 3.9 cm

## 2023-11-15 NOTE — Progress Notes (Signed)
*  PRELIMINARY RESULTS* Echocardiogram 2D Echocardiogram has been performed.  Bernis Brisker 11/15/2023, 9:28 AM
